# Patient Record
Sex: Female | Born: 1935 | Race: White | Hispanic: No | Smoking: Never smoker
Health system: Southern US, Community
[De-identification: ages and names within clinical notes are randomized; demographics above are authoritative.]

## PROBLEM LIST (undated history)

## (undated) DIAGNOSIS — K5730 Diverticulosis of large intestine without perforation or abscess without bleeding: Secondary | ICD-10-CM

## (undated) DIAGNOSIS — M545 Low back pain, unspecified: Secondary | ICD-10-CM

## (undated) DIAGNOSIS — M1990 Unspecified osteoarthritis, unspecified site: Secondary | ICD-10-CM

## (undated) DIAGNOSIS — I739 Peripheral vascular disease, unspecified: Secondary | ICD-10-CM

## (undated) DIAGNOSIS — D649 Anemia, unspecified: Secondary | ICD-10-CM

## (undated) DIAGNOSIS — I350 Nonrheumatic aortic (valve) stenosis: Secondary | ICD-10-CM

## (undated) DIAGNOSIS — J45909 Unspecified asthma, uncomplicated: Secondary | ICD-10-CM

## (undated) DIAGNOSIS — I10 Essential (primary) hypertension: Secondary | ICD-10-CM

## (undated) DIAGNOSIS — N393 Stress incontinence (female) (male): Secondary | ICD-10-CM

## (undated) DIAGNOSIS — K449 Diaphragmatic hernia without obstruction or gangrene: Secondary | ICD-10-CM

## (undated) DIAGNOSIS — E785 Hyperlipidemia, unspecified: Secondary | ICD-10-CM

## (undated) DIAGNOSIS — H3530 Unspecified macular degeneration: Secondary | ICD-10-CM

## (undated) DIAGNOSIS — I509 Heart failure, unspecified: Secondary | ICD-10-CM

## (undated) DIAGNOSIS — I872 Venous insufficiency (chronic) (peripheral): Secondary | ICD-10-CM

## (undated) DIAGNOSIS — F419 Anxiety disorder, unspecified: Secondary | ICD-10-CM

## (undated) DIAGNOSIS — M542 Cervicalgia: Secondary | ICD-10-CM

## (undated) DIAGNOSIS — K219 Gastro-esophageal reflux disease without esophagitis: Secondary | ICD-10-CM

## (undated) DIAGNOSIS — J189 Pneumonia, unspecified organism: Secondary | ICD-10-CM

## (undated) HISTORY — DX: Peripheral vascular disease, unspecified: I73.9

## (undated) HISTORY — DX: Unspecified macular degeneration: H35.30

## (undated) HISTORY — DX: Heart failure, unspecified: I50.9

## (undated) HISTORY — PX: SHOULDER SURGERY: SHX246

## (undated) HISTORY — DX: Low back pain: M54.5

## (undated) HISTORY — DX: Unspecified asthma, uncomplicated: J45.909

## (undated) HISTORY — DX: Diaphragmatic hernia without obstruction or gangrene: K44.9

## (undated) HISTORY — DX: Anemia, unspecified: D64.9

## (undated) HISTORY — DX: Nonrheumatic aortic (valve) stenosis: I35.0

## (undated) HISTORY — DX: Cervicalgia: M54.2

## (undated) HISTORY — DX: Pneumonia, unspecified organism: J18.9

## (undated) HISTORY — DX: Anxiety disorder, unspecified: F41.9

## (undated) HISTORY — DX: Diverticulosis of large intestine without perforation or abscess without bleeding: K57.30

## (undated) HISTORY — PX: LUMBAR LAMINECTOMY: SHX95

## (undated) HISTORY — DX: Essential (primary) hypertension: I10

## (undated) HISTORY — DX: Hyperlipidemia, unspecified: E78.5

## (undated) HISTORY — DX: Low back pain, unspecified: M54.50

## (undated) HISTORY — DX: Unspecified osteoarthritis, unspecified site: M19.90

## (undated) HISTORY — DX: Gastro-esophageal reflux disease without esophagitis: K21.9

## (undated) HISTORY — DX: Venous insufficiency (chronic) (peripheral): I87.2

## (undated) HISTORY — DX: Stress incontinence (female) (male): N39.3

---

## 1997-12-24 ENCOUNTER — Other Ambulatory Visit: Admission: RE | Admit: 1997-12-24 | Discharge: 1997-12-24 | Payer: Self-pay | Admitting: Obstetrics and Gynecology

## 1998-02-06 ENCOUNTER — Encounter: Payer: Self-pay | Admitting: Specialist

## 1998-02-06 ENCOUNTER — Ambulatory Visit (HOSPITAL_COMMUNITY): Admission: RE | Admit: 1998-02-06 | Discharge: 1998-02-06 | Payer: Self-pay | Admitting: Specialist

## 1998-12-18 ENCOUNTER — Encounter: Payer: Self-pay | Admitting: Obstetrics and Gynecology

## 1998-12-18 ENCOUNTER — Encounter: Admission: RE | Admit: 1998-12-18 | Discharge: 1998-12-18 | Payer: Self-pay | Admitting: Obstetrics and Gynecology

## 1999-01-01 ENCOUNTER — Other Ambulatory Visit: Admission: RE | Admit: 1999-01-01 | Discharge: 1999-01-01 | Payer: Self-pay | Admitting: Obstetrics and Gynecology

## 1999-05-01 ENCOUNTER — Encounter: Payer: Self-pay | Admitting: Orthopedic Surgery

## 1999-05-07 ENCOUNTER — Inpatient Hospital Stay (HOSPITAL_COMMUNITY): Admission: RE | Admit: 1999-05-07 | Discharge: 1999-05-10 | Payer: Self-pay | Admitting: Orthopedic Surgery

## 1999-12-24 ENCOUNTER — Encounter: Payer: Self-pay | Admitting: Obstetrics and Gynecology

## 1999-12-24 ENCOUNTER — Encounter: Admission: RE | Admit: 1999-12-24 | Discharge: 1999-12-24 | Payer: Self-pay | Admitting: Obstetrics and Gynecology

## 2000-01-19 ENCOUNTER — Other Ambulatory Visit: Admission: RE | Admit: 2000-01-19 | Discharge: 2000-01-19 | Payer: Self-pay | Admitting: Obstetrics and Gynecology

## 2001-02-17 ENCOUNTER — Other Ambulatory Visit: Admission: RE | Admit: 2001-02-17 | Discharge: 2001-02-17 | Payer: Self-pay | Admitting: Obstetrics and Gynecology

## 2001-02-21 ENCOUNTER — Encounter: Payer: Self-pay | Admitting: Obstetrics and Gynecology

## 2001-02-21 ENCOUNTER — Encounter: Admission: RE | Admit: 2001-02-21 | Discharge: 2001-02-21 | Payer: Self-pay | Admitting: Obstetrics and Gynecology

## 2001-05-04 ENCOUNTER — Encounter: Payer: Self-pay | Admitting: Orthopedic Surgery

## 2001-05-11 ENCOUNTER — Inpatient Hospital Stay (HOSPITAL_COMMUNITY): Admission: RE | Admit: 2001-05-11 | Discharge: 2001-05-13 | Payer: Self-pay | Admitting: Orthopedic Surgery

## 2002-02-08 ENCOUNTER — Encounter: Admission: RE | Admit: 2002-02-08 | Discharge: 2002-02-08 | Payer: Self-pay | Admitting: Obstetrics and Gynecology

## 2002-02-08 ENCOUNTER — Encounter: Payer: Self-pay | Admitting: Obstetrics and Gynecology

## 2002-06-06 ENCOUNTER — Encounter: Payer: Self-pay | Admitting: Orthopedic Surgery

## 2002-06-12 ENCOUNTER — Inpatient Hospital Stay (HOSPITAL_COMMUNITY): Admission: RE | Admit: 2002-06-12 | Discharge: 2002-06-15 | Payer: Self-pay | Admitting: Orthopedic Surgery

## 2002-12-23 ENCOUNTER — Encounter
Admission: RE | Admit: 2002-12-23 | Discharge: 2002-12-23 | Payer: Self-pay | Admitting: Physical Medicine and Rehabilitation

## 2003-01-15 ENCOUNTER — Inpatient Hospital Stay (HOSPITAL_COMMUNITY): Admission: RE | Admit: 2003-01-15 | Discharge: 2003-01-17 | Payer: Self-pay | Admitting: Specialist

## 2003-01-15 ENCOUNTER — Encounter (INDEPENDENT_AMBULATORY_CARE_PROVIDER_SITE_OTHER): Payer: Self-pay | Admitting: Specialist

## 2003-02-21 ENCOUNTER — Encounter: Admission: RE | Admit: 2003-02-21 | Discharge: 2003-02-21 | Payer: Self-pay | Admitting: Obstetrics and Gynecology

## 2003-12-19 ENCOUNTER — Ambulatory Visit: Payer: Self-pay | Admitting: Pulmonary Disease

## 2004-01-16 ENCOUNTER — Encounter: Admission: RE | Admit: 2004-01-16 | Discharge: 2004-01-16 | Payer: Self-pay | Admitting: Specialist

## 2004-02-06 ENCOUNTER — Inpatient Hospital Stay (HOSPITAL_COMMUNITY): Admission: EM | Admit: 2004-02-06 | Discharge: 2004-02-15 | Payer: Self-pay | Admitting: Emergency Medicine

## 2004-02-08 ENCOUNTER — Ambulatory Visit: Payer: Self-pay | Admitting: Pulmonary Disease

## 2004-02-21 ENCOUNTER — Emergency Department (HOSPITAL_COMMUNITY): Admission: EM | Admit: 2004-02-21 | Discharge: 2004-02-21 | Payer: Self-pay | Admitting: Emergency Medicine

## 2004-04-03 ENCOUNTER — Ambulatory Visit: Payer: Self-pay | Admitting: Pulmonary Disease

## 2004-04-06 ENCOUNTER — Ambulatory Visit: Payer: Self-pay | Admitting: Gastroenterology

## 2004-04-06 ENCOUNTER — Inpatient Hospital Stay (HOSPITAL_COMMUNITY): Admission: EM | Admit: 2004-04-06 | Discharge: 2004-04-10 | Payer: Self-pay | Admitting: Emergency Medicine

## 2004-04-06 ENCOUNTER — Ambulatory Visit: Payer: Self-pay | Admitting: Pulmonary Disease

## 2004-04-07 ENCOUNTER — Encounter (INDEPENDENT_AMBULATORY_CARE_PROVIDER_SITE_OTHER): Payer: Self-pay | Admitting: *Deleted

## 2004-04-09 ENCOUNTER — Encounter (INDEPENDENT_AMBULATORY_CARE_PROVIDER_SITE_OTHER): Payer: Self-pay | Admitting: Specialist

## 2004-04-15 ENCOUNTER — Ambulatory Visit: Payer: Self-pay | Admitting: Pulmonary Disease

## 2004-05-21 ENCOUNTER — Ambulatory Visit: Payer: Self-pay | Admitting: Pulmonary Disease

## 2004-07-03 ENCOUNTER — Ambulatory Visit: Payer: Self-pay | Admitting: Pulmonary Disease

## 2004-07-04 ENCOUNTER — Inpatient Hospital Stay (HOSPITAL_COMMUNITY): Admission: AD | Admit: 2004-07-04 | Discharge: 2004-07-09 | Payer: Self-pay | Admitting: Pulmonary Disease

## 2004-07-04 ENCOUNTER — Ambulatory Visit: Payer: Self-pay | Admitting: Pulmonary Disease

## 2004-07-05 ENCOUNTER — Encounter (INDEPENDENT_AMBULATORY_CARE_PROVIDER_SITE_OTHER): Payer: Self-pay | Admitting: *Deleted

## 2004-07-08 ENCOUNTER — Ambulatory Visit: Payer: Self-pay | Admitting: Internal Medicine

## 2004-07-15 ENCOUNTER — Ambulatory Visit: Payer: Self-pay | Admitting: Pulmonary Disease

## 2004-08-13 ENCOUNTER — Ambulatory Visit: Payer: Self-pay | Admitting: Pulmonary Disease

## 2004-09-10 ENCOUNTER — Ambulatory Visit: Payer: Self-pay | Admitting: Pulmonary Disease

## 2004-09-23 ENCOUNTER — Ambulatory Visit (HOSPITAL_COMMUNITY): Admission: RE | Admit: 2004-09-23 | Discharge: 2004-09-23 | Payer: Self-pay | Admitting: Pulmonary Disease

## 2004-10-08 ENCOUNTER — Ambulatory Visit: Payer: Self-pay | Admitting: Pulmonary Disease

## 2004-12-03 ENCOUNTER — Ambulatory Visit: Payer: Self-pay | Admitting: Pulmonary Disease

## 2004-12-10 ENCOUNTER — Ambulatory Visit (HOSPITAL_COMMUNITY): Admission: RE | Admit: 2004-12-10 | Discharge: 2004-12-10 | Payer: Self-pay | Admitting: Pulmonary Disease

## 2005-01-07 ENCOUNTER — Ambulatory Visit: Payer: Self-pay | Admitting: Pulmonary Disease

## 2005-02-18 ENCOUNTER — Ambulatory Visit: Payer: Self-pay | Admitting: Pulmonary Disease

## 2005-04-01 ENCOUNTER — Ambulatory Visit: Payer: Self-pay | Admitting: Pulmonary Disease

## 2005-04-22 ENCOUNTER — Ambulatory Visit: Payer: Self-pay | Admitting: Pulmonary Disease

## 2005-05-27 ENCOUNTER — Ambulatory Visit: Payer: Self-pay | Admitting: Pulmonary Disease

## 2005-08-19 ENCOUNTER — Ambulatory Visit: Payer: Self-pay | Admitting: Pulmonary Disease

## 2005-09-16 ENCOUNTER — Ambulatory Visit: Payer: Self-pay | Admitting: Internal Medicine

## 2005-11-18 ENCOUNTER — Ambulatory Visit: Payer: Self-pay | Admitting: Pulmonary Disease

## 2006-03-26 ENCOUNTER — Encounter: Admission: RE | Admit: 2006-03-26 | Discharge: 2006-03-26 | Payer: Self-pay | Admitting: Specialist

## 2006-03-31 ENCOUNTER — Ambulatory Visit: Payer: Self-pay | Admitting: Pulmonary Disease

## 2006-03-31 LAB — CONVERTED CEMR LAB
AST: 27 units/L (ref 0–37)
Albumin: 4.1 g/dL (ref 3.5–5.2)
Basophils Absolute: 0 10*3/uL (ref 0.0–0.1)
Basophils Relative: 0.1 % (ref 0.0–1.0)
Chloride: 102 meq/L (ref 96–112)
Creatinine, Ser: 0.9 mg/dL (ref 0.4–1.2)
Eosinophils Relative: 0.3 % (ref 0.0–5.0)
HCT: 37.6 % (ref 36.0–46.0)
Iron: 94 ug/dL (ref 42–145)
MCHC: 34.2 g/dL (ref 30.0–36.0)
Neutrophils Relative %: 86.3 % — ABNORMAL HIGH (ref 43.0–77.0)
RBC: 3.81 M/uL — ABNORMAL LOW (ref 3.87–5.11)
RDW: 13 % (ref 11.5–14.6)
Sodium: 140 meq/L (ref 135–145)
Total Bilirubin: 0.9 mg/dL (ref 0.3–1.2)
WBC: 11.7 10*3/uL — ABNORMAL HIGH (ref 4.5–10.5)

## 2006-04-28 ENCOUNTER — Encounter: Admission: RE | Admit: 2006-04-28 | Discharge: 2006-04-28 | Payer: Self-pay | Admitting: Specialist

## 2006-07-21 ENCOUNTER — Ambulatory Visit: Payer: Self-pay | Admitting: Pulmonary Disease

## 2006-07-22 ENCOUNTER — Encounter: Admission: RE | Admit: 2006-07-22 | Discharge: 2006-10-20 | Payer: Self-pay | Admitting: Ophthalmology

## 2006-10-14 ENCOUNTER — Encounter: Admission: RE | Admit: 2006-10-14 | Discharge: 2007-01-12 | Payer: Self-pay | Admitting: Pulmonary Disease

## 2006-10-25 ENCOUNTER — Ambulatory Visit: Payer: Self-pay | Admitting: Pulmonary Disease

## 2006-10-25 LAB — CONVERTED CEMR LAB
Bilirubin, Direct: 0.1 mg/dL (ref 0.0–0.3)
Eosinophils Absolute: 0.2 10*3/uL (ref 0.0–0.6)
Eosinophils Relative: 1.8 % (ref 0.0–5.0)
GFR calc Af Amer: 89 mL/min
GFR calc non Af Amer: 74 mL/min
Glucose, Bld: 93 mg/dL (ref 70–99)
HCT: 37.3 % (ref 36.0–46.0)
Lymphocytes Relative: 8.4 % — ABNORMAL LOW (ref 12.0–46.0)
MCV: 94.1 fL (ref 78.0–100.0)
Monocytes Absolute: 0.3 10*3/uL (ref 0.2–0.7)
Neutro Abs: 10.1 10*3/uL — ABNORMAL HIGH (ref 1.4–7.7)
Neutrophils Relative %: 87.3 % — ABNORMAL HIGH (ref 43.0–77.0)
Platelets: 190 10*3/uL (ref 150–400)
Potassium: 5 meq/L (ref 3.5–5.1)
Sodium: 139 meq/L (ref 135–145)
WBC: 11.6 10*3/uL — ABNORMAL HIGH (ref 4.5–10.5)

## 2006-11-09 ENCOUNTER — Ambulatory Visit: Payer: Self-pay | Admitting: Pulmonary Disease

## 2006-12-01 ENCOUNTER — Encounter: Admission: RE | Admit: 2006-12-01 | Discharge: 2006-12-29 | Payer: Self-pay | Admitting: Specialist

## 2007-01-19 ENCOUNTER — Ambulatory Visit: Payer: Self-pay | Admitting: Pulmonary Disease

## 2007-01-19 ENCOUNTER — Inpatient Hospital Stay (HOSPITAL_COMMUNITY): Admission: EM | Admit: 2007-01-19 | Discharge: 2007-01-31 | Payer: Self-pay | Admitting: Emergency Medicine

## 2007-01-19 ENCOUNTER — Ambulatory Visit: Payer: Self-pay | Admitting: Cardiology

## 2007-01-20 ENCOUNTER — Encounter: Payer: Self-pay | Admitting: Internal Medicine

## 2007-02-01 ENCOUNTER — Telehealth (INDEPENDENT_AMBULATORY_CARE_PROVIDER_SITE_OTHER): Payer: Self-pay | Admitting: *Deleted

## 2007-02-10 DIAGNOSIS — I10 Essential (primary) hypertension: Secondary | ICD-10-CM | POA: Insufficient documentation

## 2007-02-10 DIAGNOSIS — I739 Peripheral vascular disease, unspecified: Secondary | ICD-10-CM

## 2007-02-10 DIAGNOSIS — I872 Venous insufficiency (chronic) (peripheral): Secondary | ICD-10-CM | POA: Insufficient documentation

## 2007-02-10 DIAGNOSIS — D649 Anemia, unspecified: Secondary | ICD-10-CM | POA: Insufficient documentation

## 2007-02-10 DIAGNOSIS — M1990 Unspecified osteoarthritis, unspecified site: Secondary | ICD-10-CM | POA: Insufficient documentation

## 2007-02-10 DIAGNOSIS — M810 Age-related osteoporosis without current pathological fracture: Secondary | ICD-10-CM | POA: Insufficient documentation

## 2007-02-10 DIAGNOSIS — F411 Generalized anxiety disorder: Secondary | ICD-10-CM | POA: Insufficient documentation

## 2007-02-10 DIAGNOSIS — K219 Gastro-esophageal reflux disease without esophagitis: Secondary | ICD-10-CM

## 2007-02-10 DIAGNOSIS — E785 Hyperlipidemia, unspecified: Secondary | ICD-10-CM

## 2007-02-10 DIAGNOSIS — M545 Low back pain: Secondary | ICD-10-CM

## 2007-02-10 DIAGNOSIS — N39498 Other specified urinary incontinence: Secondary | ICD-10-CM

## 2007-02-10 DIAGNOSIS — K449 Diaphragmatic hernia without obstruction or gangrene: Secondary | ICD-10-CM | POA: Insufficient documentation

## 2007-02-10 DIAGNOSIS — K5730 Diverticulosis of large intestine without perforation or abscess without bleeding: Secondary | ICD-10-CM | POA: Insufficient documentation

## 2007-02-16 ENCOUNTER — Ambulatory Visit: Payer: Self-pay | Admitting: Pulmonary Disease

## 2007-02-20 LAB — CONVERTED CEMR LAB
ALT: 19 units/L (ref 0–35)
Basophils Relative: 0.1 % (ref 0.0–1.0)
Bilirubin, Direct: 0.1 mg/dL (ref 0.0–0.3)
CO2: 25 meq/L (ref 19–32)
Calcium: 9.1 mg/dL (ref 8.4–10.5)
Creatinine, Ser: 1 mg/dL (ref 0.4–1.2)
Eosinophils Relative: 1.1 % (ref 0.0–5.0)
GFR calc Af Amer: 69 mL/min
Glucose, Bld: 118 mg/dL — ABNORMAL HIGH (ref 70–99)
Hemoglobin: 12.1 g/dL (ref 12.0–15.0)
Lymphocytes Relative: 14.8 % (ref 12.0–46.0)
Monocytes Absolute: 0.5 10*3/uL (ref 0.2–0.7)
Neutro Abs: 7.2 10*3/uL (ref 1.4–7.7)
RDW: 15.2 % — ABNORMAL HIGH (ref 11.5–14.6)
Total Bilirubin: 0.6 mg/dL (ref 0.3–1.2)
Total Protein: 6.2 g/dL (ref 6.0–8.3)
WBC: 9.2 10*3/uL (ref 4.5–10.5)

## 2007-02-25 ENCOUNTER — Telehealth: Payer: Self-pay | Admitting: Pulmonary Disease

## 2007-03-01 ENCOUNTER — Telehealth (INDEPENDENT_AMBULATORY_CARE_PROVIDER_SITE_OTHER): Payer: Self-pay | Admitting: *Deleted

## 2007-03-08 ENCOUNTER — Encounter: Payer: Self-pay | Admitting: Pulmonary Disease

## 2007-03-28 ENCOUNTER — Telehealth (INDEPENDENT_AMBULATORY_CARE_PROVIDER_SITE_OTHER): Payer: Self-pay | Admitting: *Deleted

## 2007-03-29 ENCOUNTER — Ambulatory Visit: Payer: Self-pay | Admitting: Internal Medicine

## 2007-03-31 ENCOUNTER — Telehealth: Payer: Self-pay | Admitting: Adult Health

## 2007-04-01 ENCOUNTER — Ambulatory Visit: Payer: Self-pay | Admitting: Pulmonary Disease

## 2007-04-20 ENCOUNTER — Encounter: Admission: RE | Admit: 2007-04-20 | Discharge: 2007-04-20 | Payer: Self-pay | Admitting: Specialist

## 2007-05-02 ENCOUNTER — Ambulatory Visit: Payer: Self-pay | Admitting: Pulmonary Disease

## 2007-05-31 ENCOUNTER — Telehealth (INDEPENDENT_AMBULATORY_CARE_PROVIDER_SITE_OTHER): Payer: Self-pay | Admitting: *Deleted

## 2007-06-01 ENCOUNTER — Ambulatory Visit: Payer: Self-pay | Admitting: Internal Medicine

## 2007-06-02 ENCOUNTER — Encounter: Payer: Self-pay | Admitting: Pulmonary Disease

## 2007-06-08 LAB — CONVERTED CEMR LAB
AST: 26 units/L (ref 0–37)
Albumin: 3.7 g/dL (ref 3.5–5.2)
Alkaline Phosphatase: 36 units/L — ABNORMAL LOW (ref 39–117)
BUN: 27 mg/dL — ABNORMAL HIGH (ref 6–23)
Basophils Absolute: 0.1 10*3/uL (ref 0.0–0.1)
Basophils Relative: 1.1 % — ABNORMAL HIGH (ref 0.0–1.0)
Chloride: 105 meq/L (ref 96–112)
Crystals: NEGATIVE
Eosinophils Absolute: 0.2 10*3/uL (ref 0.0–0.7)
GFR calc Af Amer: 69 mL/min
GFR calc non Af Amer: 57 mL/min
Leukocytes, UA: NEGATIVE
Lymphocytes Relative: 7.7 % — ABNORMAL LOW (ref 12.0–46.0)
MCHC: 33.6 g/dL (ref 30.0–36.0)
Mucus, UA: NEGATIVE
Neutrophils Relative %: 85 % — ABNORMAL HIGH (ref 43.0–77.0)
Nitrite: NEGATIVE
Platelets: 228 10*3/uL (ref 150–400)
Potassium: 4.5 meq/L (ref 3.5–5.1)
RBC / HPF: NONE SEEN
RBC: 3.93 M/uL (ref 3.87–5.11)
RDW: 13.6 % (ref 11.5–14.6)
Specific Gravity, Urine: <=1.005 (ref 1.000–1.03)
TSH: 1.39 microintl units/mL (ref 0.35–5.50)
Total Bilirubin: 0.6 mg/dL (ref 0.3–1.2)
Urobilinogen, UA: 0.2 (ref 0.0–1.0)
WBC, UA: NONE SEEN cells/hpf
pH: 7 (ref 5.0–8.0)

## 2007-06-10 ENCOUNTER — Telehealth (INDEPENDENT_AMBULATORY_CARE_PROVIDER_SITE_OTHER): Payer: Self-pay | Admitting: *Deleted

## 2007-07-04 ENCOUNTER — Ambulatory Visit: Payer: Self-pay | Admitting: Internal Medicine

## 2007-07-25 ENCOUNTER — Ambulatory Visit: Payer: Self-pay | Admitting: Pulmonary Disease

## 2007-08-08 ENCOUNTER — Telehealth (INDEPENDENT_AMBULATORY_CARE_PROVIDER_SITE_OTHER): Payer: Self-pay | Admitting: *Deleted

## 2007-08-09 ENCOUNTER — Ambulatory Visit: Payer: Self-pay | Admitting: Internal Medicine

## 2007-08-22 ENCOUNTER — Telehealth (INDEPENDENT_AMBULATORY_CARE_PROVIDER_SITE_OTHER): Payer: Self-pay | Admitting: *Deleted

## 2007-09-05 ENCOUNTER — Ambulatory Visit: Payer: Self-pay | Admitting: Pulmonary Disease

## 2007-09-05 ENCOUNTER — Inpatient Hospital Stay (HOSPITAL_COMMUNITY): Admission: EM | Admit: 2007-09-05 | Discharge: 2007-09-10 | Payer: Self-pay | Admitting: Emergency Medicine

## 2007-09-06 ENCOUNTER — Telehealth: Payer: Self-pay | Admitting: Pulmonary Disease

## 2007-09-15 ENCOUNTER — Ambulatory Visit: Payer: Self-pay | Admitting: Pulmonary Disease

## 2007-09-15 DIAGNOSIS — I359 Nonrheumatic aortic valve disorder, unspecified: Secondary | ICD-10-CM | POA: Insufficient documentation

## 2007-09-28 ENCOUNTER — Encounter: Payer: Self-pay | Admitting: Pulmonary Disease

## 2007-10-07 ENCOUNTER — Telehealth: Payer: Self-pay | Admitting: Pulmonary Disease

## 2007-10-19 ENCOUNTER — Encounter: Admission: RE | Admit: 2007-10-19 | Discharge: 2007-10-19 | Payer: Self-pay | Admitting: Specialist

## 2007-10-20 ENCOUNTER — Encounter: Payer: Self-pay | Admitting: Pulmonary Disease

## 2007-10-26 ENCOUNTER — Encounter: Payer: Self-pay | Admitting: Pulmonary Disease

## 2007-10-27 ENCOUNTER — Telehealth: Payer: Self-pay | Admitting: Pulmonary Disease

## 2007-11-04 ENCOUNTER — Ambulatory Visit: Payer: Self-pay | Admitting: Family Medicine

## 2007-11-04 ENCOUNTER — Ambulatory Visit: Payer: Self-pay | Admitting: Pulmonary Disease

## 2007-11-10 ENCOUNTER — Telehealth: Payer: Self-pay | Admitting: Pulmonary Disease

## 2007-11-11 ENCOUNTER — Encounter: Payer: Self-pay | Admitting: Pulmonary Disease

## 2007-11-14 ENCOUNTER — Ambulatory Visit: Payer: Self-pay | Admitting: Pulmonary Disease

## 2007-11-15 ENCOUNTER — Encounter: Payer: Self-pay | Admitting: Pulmonary Disease

## 2007-11-16 ENCOUNTER — Telehealth: Payer: Self-pay | Admitting: Pulmonary Disease

## 2007-11-17 ENCOUNTER — Ambulatory Visit: Payer: Self-pay | Admitting: Pulmonary Disease

## 2007-11-17 ENCOUNTER — Telehealth (INDEPENDENT_AMBULATORY_CARE_PROVIDER_SITE_OTHER): Payer: Self-pay | Admitting: *Deleted

## 2007-11-25 ENCOUNTER — Telehealth (INDEPENDENT_AMBULATORY_CARE_PROVIDER_SITE_OTHER): Payer: Self-pay | Admitting: *Deleted

## 2007-11-25 ENCOUNTER — Ambulatory Visit: Payer: Self-pay | Admitting: Pulmonary Disease

## 2007-12-15 ENCOUNTER — Ambulatory Visit: Payer: Self-pay | Admitting: Pulmonary Disease

## 2007-12-19 ENCOUNTER — Ambulatory Visit: Payer: Self-pay | Admitting: Pulmonary Disease

## 2007-12-19 LAB — CONVERTED CEMR LAB
CO2: 28 meq/L (ref 19–32)
Chloride: 104 meq/L (ref 96–112)
Creatinine, Ser: 0.8 mg/dL (ref 0.4–1.2)
GFR calc non Af Amer: 73 mL/min
Potassium: 4.6 meq/L (ref 3.5–5.1)

## 2007-12-20 ENCOUNTER — Ambulatory Visit: Payer: Self-pay | Admitting: Cardiovascular Disease

## 2007-12-26 ENCOUNTER — Telehealth: Payer: Self-pay | Admitting: Adult Health

## 2008-01-04 ENCOUNTER — Ambulatory Visit: Payer: Self-pay | Admitting: Pulmonary Disease

## 2008-01-04 DIAGNOSIS — J329 Chronic sinusitis, unspecified: Secondary | ICD-10-CM | POA: Insufficient documentation

## 2008-01-04 DIAGNOSIS — J189 Pneumonia, unspecified organism: Secondary | ICD-10-CM

## 2008-02-13 ENCOUNTER — Telehealth (INDEPENDENT_AMBULATORY_CARE_PROVIDER_SITE_OTHER): Payer: Self-pay | Admitting: *Deleted

## 2008-02-14 ENCOUNTER — Ambulatory Visit: Payer: Self-pay | Admitting: Pulmonary Disease

## 2008-05-21 ENCOUNTER — Telehealth: Payer: Self-pay | Admitting: Pulmonary Disease

## 2008-05-23 ENCOUNTER — Ambulatory Visit: Payer: Self-pay | Admitting: Pulmonary Disease

## 2008-05-25 ENCOUNTER — Encounter: Payer: Self-pay | Admitting: Pulmonary Disease

## 2008-05-30 ENCOUNTER — Ambulatory Visit: Payer: Self-pay | Admitting: Pulmonary Disease

## 2008-05-30 DIAGNOSIS — M109 Gout, unspecified: Secondary | ICD-10-CM

## 2008-06-02 DIAGNOSIS — H3530 Unspecified macular degeneration: Secondary | ICD-10-CM | POA: Insufficient documentation

## 2008-06-02 LAB — CONVERTED CEMR LAB
ALT: 23 units/L (ref 0–35)
AST: 23 units/L (ref 0–37)
Alkaline Phosphatase: 35 units/L — ABNORMAL LOW (ref 39–117)
Basophils Relative: 0.7 % (ref 0.0–3.0)
Bilirubin, Direct: 0.1 mg/dL (ref 0.0–0.3)
CO2: 29 meq/L (ref 19–32)
Chloride: 108 meq/L (ref 96–112)
Direct LDL: 127.7 mg/dL
Eosinophils Relative: 4.9 % (ref 0.0–5.0)
Glucose, Bld: 85 mg/dL (ref 70–99)
HDL: 44.1 mg/dL (ref >39.00)
Lymphocytes Relative: 28.7 % (ref 12.0–46.0)
Monocytes Absolute: 0.6 10*3/uL (ref 0.1–1.0)
Monocytes Relative: 9.8 % (ref 3.0–12.0)
Neutrophils Relative %: 55.9 % (ref 43.0–77.0)
Platelets: 179 10*3/uL (ref 150.0–400.0)
RBC: 3.88 M/uL (ref 3.87–5.11)
Sodium: 147 meq/L — ABNORMAL HIGH (ref 135–145)
Total Bilirubin: 0.8 mg/dL (ref 0.3–1.2)
Total CHOL/HDL Ratio: 5
Triglycerides: 153 mg/dL — ABNORMAL HIGH (ref 0.0–149.0)
VLDL: 30.6 mg/dL (ref 0.0–40.0)
WBC: 6.6 10*3/uL (ref 4.5–10.5)

## 2008-06-04 ENCOUNTER — Encounter: Admission: RE | Admit: 2008-06-04 | Discharge: 2008-06-04 | Payer: Self-pay | Admitting: Neurology

## 2008-06-07 ENCOUNTER — Telehealth (INDEPENDENT_AMBULATORY_CARE_PROVIDER_SITE_OTHER): Payer: Self-pay | Admitting: *Deleted

## 2008-06-15 ENCOUNTER — Encounter: Payer: Self-pay | Admitting: Pulmonary Disease

## 2008-07-05 ENCOUNTER — Encounter: Payer: Self-pay | Admitting: Pulmonary Disease

## 2008-07-26 ENCOUNTER — Telehealth: Payer: Self-pay | Admitting: Pulmonary Disease

## 2008-10-03 ENCOUNTER — Ambulatory Visit: Payer: Self-pay | Admitting: Pulmonary Disease

## 2008-10-07 DIAGNOSIS — M542 Cervicalgia: Secondary | ICD-10-CM

## 2008-11-13 ENCOUNTER — Encounter: Payer: Self-pay | Admitting: Pulmonary Disease

## 2008-11-13 ENCOUNTER — Telehealth: Payer: Self-pay | Admitting: Pulmonary Disease

## 2008-11-14 ENCOUNTER — Inpatient Hospital Stay (HOSPITAL_COMMUNITY): Admission: EM | Admit: 2008-11-14 | Discharge: 2008-11-16 | Payer: Self-pay | Admitting: Emergency Medicine

## 2008-11-22 ENCOUNTER — Telehealth: Payer: Self-pay | Admitting: Pulmonary Disease

## 2008-11-29 ENCOUNTER — Ambulatory Visit: Payer: Self-pay | Admitting: Pulmonary Disease

## 2008-11-29 LAB — CONVERTED CEMR LAB
Hemoglobin, Urine: NEGATIVE
Nitrite: NEGATIVE
Total Protein, Urine: NEGATIVE mg/dL

## 2008-12-01 ENCOUNTER — Encounter: Payer: Self-pay | Admitting: Pulmonary Disease

## 2008-12-10 ENCOUNTER — Telehealth: Payer: Self-pay | Admitting: Pulmonary Disease

## 2008-12-11 ENCOUNTER — Telehealth (INDEPENDENT_AMBULATORY_CARE_PROVIDER_SITE_OTHER): Payer: Self-pay | Admitting: *Deleted

## 2008-12-24 ENCOUNTER — Telehealth: Payer: Self-pay | Admitting: Pulmonary Disease

## 2008-12-24 DIAGNOSIS — N390 Urinary tract infection, site not specified: Secondary | ICD-10-CM

## 2008-12-25 ENCOUNTER — Encounter: Payer: Self-pay | Admitting: Pulmonary Disease

## 2009-01-07 ENCOUNTER — Emergency Department (HOSPITAL_COMMUNITY): Admission: EM | Admit: 2009-01-07 | Discharge: 2009-01-07 | Payer: Self-pay | Admitting: Emergency Medicine

## 2009-01-07 ENCOUNTER — Telehealth (INDEPENDENT_AMBULATORY_CARE_PROVIDER_SITE_OTHER): Payer: Self-pay | Admitting: *Deleted

## 2009-01-08 ENCOUNTER — Telehealth: Payer: Self-pay | Admitting: Pulmonary Disease

## 2009-01-10 ENCOUNTER — Ambulatory Visit: Payer: Self-pay | Admitting: Pulmonary Disease

## 2009-01-17 ENCOUNTER — Encounter: Payer: Self-pay | Admitting: Pulmonary Disease

## 2009-01-21 ENCOUNTER — Encounter: Payer: Self-pay | Admitting: Pulmonary Disease

## 2009-02-05 ENCOUNTER — Encounter: Payer: Self-pay | Admitting: Pulmonary Disease

## 2009-02-19 ENCOUNTER — Ambulatory Visit: Payer: Self-pay | Admitting: Internal Medicine

## 2009-02-19 ENCOUNTER — Telehealth (INDEPENDENT_AMBULATORY_CARE_PROVIDER_SITE_OTHER): Payer: Self-pay | Admitting: *Deleted

## 2009-04-08 ENCOUNTER — Telehealth (INDEPENDENT_AMBULATORY_CARE_PROVIDER_SITE_OTHER): Payer: Self-pay | Admitting: *Deleted

## 2009-04-18 ENCOUNTER — Emergency Department (HOSPITAL_COMMUNITY): Admission: EM | Admit: 2009-04-18 | Discharge: 2009-04-18 | Payer: Self-pay | Admitting: Emergency Medicine

## 2009-04-22 ENCOUNTER — Encounter (HOSPITAL_BASED_OUTPATIENT_CLINIC_OR_DEPARTMENT_OTHER): Admission: RE | Admit: 2009-04-22 | Discharge: 2009-07-21 | Payer: Self-pay | Admitting: Internal Medicine

## 2009-05-02 ENCOUNTER — Encounter: Payer: Self-pay | Admitting: Adult Health

## 2009-05-02 ENCOUNTER — Ambulatory Visit: Payer: Self-pay | Admitting: Pulmonary Disease

## 2009-05-03 ENCOUNTER — Telehealth (INDEPENDENT_AMBULATORY_CARE_PROVIDER_SITE_OTHER): Payer: Self-pay | Admitting: *Deleted

## 2009-05-07 LAB — CONVERTED CEMR LAB
Basophils Absolute: 0 10*3/uL (ref 0.0–0.1)
CO2: 28 meq/L (ref 19–32)
Chloride: 105 meq/L (ref 96–112)
Hemoglobin: 12.5 g/dL (ref 12.0–15.0)
Iron: 22 ug/dL — ABNORMAL LOW (ref 42–145)
Lymphocytes Relative: 12.9 % (ref 12.0–46.0)
Monocytes Relative: 5.2 % (ref 3.0–12.0)
Neutro Abs: 6.9 10*3/uL (ref 1.4–7.7)
Neutrophils Relative %: 80.5 % — ABNORMAL HIGH (ref 43.0–77.0)
Nitrite: NEGATIVE
Platelets: 247 10*3/uL (ref 150.0–400.0)
Potassium: 5 meq/L (ref 3.5–5.1)
RDW: 15.6 % — ABNORMAL HIGH (ref 11.5–14.6)
Sodium: 143 meq/L (ref 135–145)
Specific Gravity, Urine: 1.01 (ref 1.000–1.030)
Total Protein, Urine: NEGATIVE mg/dL
Urine Glucose: NEGATIVE mg/dL

## 2009-05-15 ENCOUNTER — Encounter: Payer: Self-pay | Admitting: Pulmonary Disease

## 2009-06-27 ENCOUNTER — Ambulatory Visit: Payer: Self-pay | Admitting: Pulmonary Disease

## 2009-07-07 LAB — CONVERTED CEMR LAB
AST: 26 units/L (ref 0–37)
Albumin: 3.8 g/dL (ref 3.5–5.2)
BUN: 23 mg/dL (ref 6–23)
Basophils Absolute: 0 10*3/uL (ref 0.0–0.1)
CO2: 25 meq/L (ref 19–32)
Calcium: 8.7 mg/dL (ref 8.4–10.5)
Eosinophils Relative: 0.5 % (ref 0.0–5.0)
GFR calc non Af Amer: 114.78 mL/min (ref >60)
Glucose, Bld: 87 mg/dL (ref 70–99)
HCT: 39.1 % (ref 36.0–46.0)
Iron: 57 ug/dL (ref 42–145)
Lymphocytes Relative: 17.4 % (ref 12.0–46.0)
Lymphs Abs: 1.3 10*3/uL (ref 0.7–4.0)
Monocytes Relative: 4.9 % (ref 3.0–12.0)
Platelets: 177 10*3/uL (ref 150.0–400.0)
Potassium: 4 meq/L (ref 3.5–5.1)
Pro B Natriuretic peptide (BNP): 188.2 pg/mL — ABNORMAL HIGH (ref 0.0–100.0)
RDW: 16 % — ABNORMAL HIGH (ref 11.5–14.6)
TSH: 1 microintl units/mL (ref 0.35–5.50)
Total Protein: 6.4 g/dL (ref 6.0–8.3)
Transferrin: 177.5 mg/dL — ABNORMAL LOW (ref 212.0–360.0)
Uric Acid, Serum: 3.2 mg/dL (ref 2.4–7.0)
WBC: 7.7 10*3/uL (ref 4.5–10.5)

## 2009-07-31 ENCOUNTER — Encounter: Payer: Self-pay | Admitting: Pulmonary Disease

## 2009-08-19 ENCOUNTER — Telehealth: Payer: Self-pay | Admitting: Pulmonary Disease

## 2009-10-16 ENCOUNTER — Ambulatory Visit: Payer: Self-pay | Admitting: Pulmonary Disease

## 2009-12-13 ENCOUNTER — Telehealth: Payer: Self-pay | Admitting: Pulmonary Disease

## 2010-02-19 ENCOUNTER — Ambulatory Visit
Admission: RE | Admit: 2010-02-19 | Discharge: 2010-02-19 | Payer: Self-pay | Source: Home / Self Care | Attending: Pulmonary Disease | Admitting: Pulmonary Disease

## 2010-02-19 ENCOUNTER — Other Ambulatory Visit: Payer: Self-pay | Admitting: Pulmonary Disease

## 2010-02-19 ENCOUNTER — Ambulatory Visit: Admit: 2010-02-19 | Payer: Self-pay | Admitting: Pulmonary Disease

## 2010-02-19 LAB — CBC WITH DIFFERENTIAL/PLATELET
Basophils Absolute: 0.1 10*3/uL (ref 0.0–0.1)
Basophils Relative: 0.6 % (ref 0.0–3.0)
Eosinophils Absolute: 0.1 10*3/uL (ref 0.0–0.7)
Eosinophils Relative: 0.6 % (ref 0.0–5.0)
HCT: 38.5 % (ref 36.0–46.0)
Hemoglobin: 13 g/dL (ref 12.0–15.0)
Lymphocytes Relative: 11.8 % — ABNORMAL LOW (ref 12.0–46.0)
Lymphs Abs: 1.6 10*3/uL (ref 0.7–4.0)
MCHC: 33.8 g/dL (ref 30.0–36.0)
MCV: 96 fl (ref 78.0–100.0)
Monocytes Absolute: 1.1 10*3/uL — ABNORMAL HIGH (ref 0.1–1.0)
Monocytes Relative: 8.4 % (ref 3.0–12.0)
Neutro Abs: 10.8 10*3/uL — ABNORMAL HIGH (ref 1.4–7.7)
Neutrophils Relative %: 78.6 % — ABNORMAL HIGH (ref 43.0–77.0)
Platelets: 187 10*3/uL (ref 150.0–400.0)
RBC: 4.01 Mil/uL (ref 3.87–5.11)
RDW: 14.3 % (ref 11.5–14.6)
WBC: 13.7 10*3/uL — ABNORMAL HIGH (ref 4.5–10.5)

## 2010-02-19 LAB — BASIC METABOLIC PANEL
BUN: 13 mg/dL (ref 6–23)
CO2: 28 mEq/L (ref 19–32)
Calcium: 9 mg/dL (ref 8.4–10.5)
Chloride: 103 mEq/L (ref 96–112)
Creatinine, Ser: 0.7 mg/dL (ref 0.4–1.2)
GFR: 89.35 mL/min (ref >60.00)
Glucose, Bld: 102 mg/dL — ABNORMAL HIGH (ref 70–99)
Potassium: 4.7 mEq/L (ref 3.5–5.1)
Sodium: 141 mEq/L (ref 135–145)

## 2010-02-19 LAB — HEPATIC FUNCTION PANEL
ALT: 37 U/L — ABNORMAL HIGH (ref 0–35)
AST: 40 U/L — ABNORMAL HIGH (ref 0–37)
Albumin: 3.2 g/dL — ABNORMAL LOW (ref 3.5–5.2)
Alkaline Phosphatase: 55 U/L (ref 39–117)
Bilirubin, Direct: 0.1 mg/dL (ref 0.0–0.3)
Total Bilirubin: 0.5 mg/dL (ref 0.3–1.2)
Total Protein: 5.9 g/dL — ABNORMAL LOW (ref 6.0–8.3)

## 2010-02-19 LAB — TSH: TSH: 2.03 u[IU]/mL (ref 0.35–5.50)

## 2010-02-19 LAB — URIC ACID: Uric Acid, Serum: 2.1 mg/dL — ABNORMAL LOW (ref 2.4–7.0)

## 2010-02-19 LAB — IBC PANEL
Iron: 13 ug/dL — ABNORMAL LOW (ref 42–145)
Saturation Ratios: 6.6 % — ABNORMAL LOW (ref 20.0–50.0)
Transferrin: 139.9 mg/dL — ABNORMAL LOW (ref 212.0–360.0)

## 2010-02-19 LAB — SEDIMENTATION RATE: Sed Rate: 59 mm/hr — ABNORMAL HIGH (ref 0–22)

## 2010-02-19 LAB — BRAIN NATRIURETIC PEPTIDE: Pro B Natriuretic peptide (BNP): 1445.5 pg/mL — ABNORMAL HIGH (ref 0.0–100.0)

## 2010-02-22 ENCOUNTER — Inpatient Hospital Stay (HOSPITAL_COMMUNITY)
Admission: EM | Admit: 2010-02-22 | Discharge: 2010-02-26 | Payer: Self-pay | Source: Home / Self Care | Attending: Internal Medicine | Admitting: Internal Medicine

## 2010-02-24 ENCOUNTER — Encounter: Payer: Self-pay | Admitting: Specialist

## 2010-02-25 LAB — CBC
HCT: 36.9 % (ref 36.0–46.0)
HCT: 33.6 % — ABNORMAL LOW (ref 36.0–46.0)
Hemoglobin: 12.4 g/dL (ref 12.0–15.0)
MCH: 30.4 pg (ref 26.0–34.0)
MCHC: 32 g/dL (ref 30.0–36.0)
MCHC: 33.3 g/dL (ref 30.0–36.0)
MCV: 93.3 fL (ref 78.0–100.0)
Platelets: 238 10*3/uL (ref 150–400)
Platelets: 297 10*3/uL (ref 150–400)
RBC: 4.11 MIL/uL (ref 3.87–5.11)
RDW: 13.8 % (ref 11.5–15.5)
RDW: 13.6 % (ref 11.5–15.5)
WBC: 12.8 10*3/uL — ABNORMAL HIGH (ref 4.0–10.5)

## 2010-02-25 LAB — COMPREHENSIVE METABOLIC PANEL
ALT: 35 U/L (ref 0–35)
ALT: 29 U/L (ref 0–35)
AST: 30 U/L (ref 0–37)
Alkaline Phosphatase: 56 U/L (ref 39–117)
BUN: 14 mg/dL (ref 6–23)
CO2: 23 mEq/L (ref 19–32)
CO2: 24 mEq/L (ref 19–32)
Calcium: 8.5 mg/dL (ref 8.4–10.5)
Calcium: 7.8 mg/dL — ABNORMAL LOW (ref 8.4–10.5)
Creatinine, Ser: 0.64 mg/dL (ref 0.4–1.2)
GFR calc Af Amer: >60 mL/min (ref >60)
GFR calc non Af Amer: >60 mL/min (ref >60)
Glucose, Bld: 121 mg/dL — ABNORMAL HIGH (ref 70–99)
Glucose, Bld: 128 mg/dL — ABNORMAL HIGH (ref 70–99)
Potassium: 3.3 mEq/L — ABNORMAL LOW (ref 3.5–5.1)
Sodium: 140 mEq/L (ref 135–145)
Sodium: 140 mEq/L (ref 135–145)
Total Protein: 5.9 g/dL — ABNORMAL LOW (ref 6.0–8.3)
Total Protein: 5.7 g/dL — ABNORMAL LOW (ref 6.0–8.3)

## 2010-02-25 LAB — CK TOTAL AND CKMB (NOT AT ARMC)
CK, MB: 6.3 ng/mL (ref 0.3–4.0)
Relative Index: INVALID (ref 0.0–2.5)
Total CK: 78 U/L (ref 7–177)

## 2010-02-25 LAB — BASIC METABOLIC PANEL
Chloride: 105 mEq/L (ref 96–112)
Creatinine, Ser: 0.77 mg/dL (ref 0.4–1.2)
GFR calc Af Amer: >60 mL/min (ref >60)
GFR calc non Af Amer: >60 mL/min (ref >60)
Potassium: 3.7 mEq/L (ref 3.5–5.1)

## 2010-02-25 LAB — BRAIN NATRIURETIC PEPTIDE
Pro B Natriuretic peptide (BNP): 620 pg/mL — ABNORMAL HIGH (ref 0.0–100.0)
Pro B Natriuretic peptide (BNP): 718 pg/mL — ABNORMAL HIGH (ref 0.0–100.0)

## 2010-02-25 LAB — HEPARIN LEVEL (UNFRACTIONATED)
Heparin Unfractionated: 0.24 IU/mL — ABNORMAL LOW (ref 0.30–0.70)
Heparin Unfractionated: 0.3 IU/mL (ref 0.30–0.70)

## 2010-02-25 LAB — TSH: TSH: 0.615 u[IU]/mL (ref 0.350–4.500)

## 2010-02-25 LAB — DIFFERENTIAL
Basophils Absolute: 0 10*3/uL (ref 0.0–0.1)
Eosinophils Relative: 2 % (ref 0–5)
Lymphocytes Relative: 14 % (ref 12–46)
Monocytes Absolute: 0.6 10*3/uL (ref 0.1–1.0)
Monocytes Relative: 5 % (ref 3–12)

## 2010-02-25 LAB — MRSA PCR SCREENING: MRSA by PCR: NEGATIVE

## 2010-02-25 LAB — CARDIAC PANEL(CRET KIN+CKTOT+MB+TROPI)
CK, MB: 9.1 ng/mL (ref 0.3–4.0)
Relative Index: INVALID (ref 0.0–2.5)
Relative Index: 6.7 — ABNORMAL HIGH (ref 0.0–2.5)
Troponin I: 1.28 ng/mL (ref 0.00–0.06)
Troponin I: 1.05 ng/mL (ref 0.00–0.06)

## 2010-02-25 LAB — URINALYSIS, ROUTINE W REFLEX MICROSCOPIC
Bilirubin Urine: NEGATIVE
Ketones, ur: NEGATIVE mg/dL
Nitrite: NEGATIVE
Urobilinogen, UA: 0.2 mg/dL (ref 0.0–1.0)

## 2010-02-25 LAB — POCT CARDIAC MARKERS
CKMB, poc: 5.2 ng/mL (ref 1.0–8.0)
Myoglobin, poc: 147 ng/mL (ref 12–200)

## 2010-02-25 LAB — TROPONIN I: Troponin I: 0.67 ng/mL (ref 0.00–0.06)

## 2010-02-26 LAB — BASIC METABOLIC PANEL
Calcium: 9.8 mg/dL (ref 8.4–10.5)
Creatinine, Ser: 0.78 mg/dL (ref 0.4–1.2)
GFR calc Af Amer: >60 mL/min (ref >60)
GFR calc non Af Amer: >60 mL/min (ref >60)
Glucose, Bld: 111 mg/dL — ABNORMAL HIGH (ref 70–99)
Sodium: 141 mEq/L (ref 135–145)

## 2010-02-26 LAB — CBC
HCT: 47.2 % — ABNORMAL HIGH (ref 36.0–46.0)
MCHC: 32.8 g/dL (ref 30.0–36.0)
MCV: 94.4 fL (ref 78.0–100.0)
Platelets: 366 10*3/uL (ref 150–400)
RDW: 13.8 % (ref 11.5–15.5)
WBC: 13.9 10*3/uL — ABNORMAL HIGH (ref 4.0–10.5)

## 2010-02-27 ENCOUNTER — Inpatient Hospital Stay (HOSPITAL_COMMUNITY)
Admission: EM | Admit: 2010-02-27 | Discharge: 2010-03-10 | DRG: 280 | Disposition: A | Discharge status: Skilled Nursing Facility | Payer: Medicare Other | Attending: Internal Medicine | Admitting: Internal Medicine

## 2010-02-27 DIAGNOSIS — N390 Urinary tract infection, site not specified: Secondary | ICD-10-CM | POA: Diagnosis present

## 2010-02-27 DIAGNOSIS — E871 Hypo-osmolality and hyponatremia: Secondary | ICD-10-CM | POA: Diagnosis present

## 2010-02-27 DIAGNOSIS — E872 Acidosis, unspecified: Secondary | ICD-10-CM | POA: Diagnosis present

## 2010-02-27 DIAGNOSIS — N170 Acute kidney failure with tubular necrosis: Secondary | ICD-10-CM | POA: Diagnosis present

## 2010-02-27 DIAGNOSIS — M109 Gout, unspecified: Secondary | ICD-10-CM | POA: Diagnosis present

## 2010-02-27 DIAGNOSIS — E875 Hyperkalemia: Secondary | ICD-10-CM | POA: Diagnosis present

## 2010-02-27 DIAGNOSIS — Z66 Do not resuscitate: Secondary | ICD-10-CM | POA: Diagnosis present

## 2010-02-27 DIAGNOSIS — J9600 Acute respiratory failure, unspecified whether with hypoxia or hypercapnia: Secondary | ICD-10-CM | POA: Diagnosis present

## 2010-02-27 DIAGNOSIS — I5031 Acute diastolic (congestive) heart failure: Principal | ICD-10-CM | POA: Diagnosis present

## 2010-02-27 DIAGNOSIS — R7309 Other abnormal glucose: Secondary | ICD-10-CM | POA: Diagnosis present

## 2010-02-27 DIAGNOSIS — I509 Heart failure, unspecified: Secondary | ICD-10-CM | POA: Diagnosis present

## 2010-02-27 DIAGNOSIS — I059 Rheumatic mitral valve disease, unspecified: Secondary | ICD-10-CM | POA: Diagnosis present

## 2010-02-27 DIAGNOSIS — I359 Nonrheumatic aortic valve disorder, unspecified: Secondary | ICD-10-CM | POA: Diagnosis present

## 2010-02-27 DIAGNOSIS — A498 Other bacterial infections of unspecified site: Secondary | ICD-10-CM | POA: Diagnosis present

## 2010-02-27 DIAGNOSIS — K2970 Gastritis, unspecified, without bleeding: Secondary | ICD-10-CM | POA: Diagnosis present

## 2010-02-27 DIAGNOSIS — K5732 Diverticulitis of large intestine without perforation or abscess without bleeding: Secondary | ICD-10-CM | POA: Diagnosis present

## 2010-02-27 DIAGNOSIS — K219 Gastro-esophageal reflux disease without esophagitis: Secondary | ICD-10-CM | POA: Diagnosis present

## 2010-02-27 DIAGNOSIS — M545 Low back pain, unspecified: Secondary | ICD-10-CM | POA: Diagnosis present

## 2010-02-27 DIAGNOSIS — N182 Chronic kidney disease, stage 2 (mild): Secondary | ICD-10-CM | POA: Diagnosis present

## 2010-02-27 DIAGNOSIS — D72829 Elevated white blood cell count, unspecified: Secondary | ICD-10-CM | POA: Diagnosis present

## 2010-02-27 DIAGNOSIS — A419 Sepsis, unspecified organism: Secondary | ICD-10-CM | POA: Diagnosis present

## 2010-02-27 DIAGNOSIS — I214 Non-ST elevation (NSTEMI) myocardial infarction: Secondary | ICD-10-CM | POA: Diagnosis present

## 2010-02-27 DIAGNOSIS — J45909 Unspecified asthma, uncomplicated: Secondary | ICD-10-CM | POA: Diagnosis present

## 2010-02-27 LAB — POCT I-STAT, CHEM 8
Creatinine, Ser: 2.5 mg/dL — ABNORMAL HIGH (ref 0.4–1.2)
HCT: 53 % — ABNORMAL HIGH (ref 36.0–46.0)
Hemoglobin: 18 g/dL — ABNORMAL HIGH (ref 12.0–15.0)
Potassium: 5.5 mEq/L — ABNORMAL HIGH (ref 3.5–5.1)
Sodium: 134 mEq/L — ABNORMAL LOW (ref 135–145)
TCO2: 18 mmol/L (ref 0–100)

## 2010-02-27 LAB — DIFFERENTIAL
Basophils Absolute: 0.3 10*3/uL — ABNORMAL HIGH (ref 0.0–0.1)
Basophils Relative: 1 % (ref 0–1)
Eosinophils Absolute: 0 10*3/uL (ref 0.0–0.7)
Lymphocytes Relative: 7 % — ABNORMAL LOW (ref 12–46)
Lymphs Abs: 2.2 10*3/uL (ref 0.7–4.0)
Monocytes Absolute: 1.6 10*3/uL — ABNORMAL HIGH (ref 0.1–1.0)
Neutro Abs: 27.1 10*3/uL — ABNORMAL HIGH (ref 1.7–7.7)

## 2010-02-27 LAB — COMPREHENSIVE METABOLIC PANEL
ALT: 59 U/L — ABNORMAL HIGH (ref 0–35)
AST: 41 U/L — ABNORMAL HIGH (ref 0–37)
Albumin: 3.1 g/dL — ABNORMAL LOW (ref 3.5–5.2)
Alkaline Phosphatase: 59 U/L (ref 39–117)
BUN: 74 mg/dL — ABNORMAL HIGH (ref 6–23)
CO2: 19 mEq/L (ref 19–32)
Calcium: 10.2 mg/dL (ref 8.4–10.5)
Chloride: 101 mEq/L (ref 96–112)
Chloride: 96 mEq/L (ref 96–112)
Creatinine, Ser: 2.29 mg/dL — ABNORMAL HIGH (ref 0.4–1.2)
GFR calc Af Amer: 25 mL/min — ABNORMAL LOW (ref >60)
GFR calc non Af Amer: 20 mL/min — ABNORMAL LOW (ref >60)
Glucose, Bld: 156 mg/dL — ABNORMAL HIGH (ref 70–99)
Potassium: 4.9 mEq/L (ref 3.5–5.1)
Total Bilirubin: 0.6 mg/dL (ref 0.3–1.2)
Total Bilirubin: 0.8 mg/dL (ref 0.3–1.2)

## 2010-02-27 LAB — BLOOD GAS, ARTERIAL
Drawn by: 30806
O2 Content: 2 L/min
O2 Saturation: 95.6 %
Patient temperature: 98.6

## 2010-02-27 LAB — URINE MICROSCOPIC-ADD ON

## 2010-02-27 LAB — TROPONIN I
Troponin I: 0.47 ng/mL — ABNORMAL HIGH (ref 0.00–0.06)
Troponin I: 0.35 ng/mL — ABNORMAL HIGH (ref 0.00–0.06)

## 2010-02-27 LAB — CBC
Hemoglobin: 16.2 g/dL — ABNORMAL HIGH (ref 12.0–15.0)
MCH: 31 pg (ref 26.0–34.0)
MCH: 31 pg (ref 26.0–34.0)
MCHC: 33.1 g/dL (ref 30.0–36.0)
MCHC: 33.8 g/dL (ref 30.0–36.0)
Platelets: 307 10*3/uL (ref 150–400)
Platelets: 367 10*3/uL (ref 150–400)
RDW: 13.5 % (ref 11.5–15.5)
RDW: 14 % (ref 11.5–15.5)

## 2010-02-27 LAB — GLUCOSE, CAPILLARY: Glucose-Capillary: 119 mg/dL — ABNORMAL HIGH (ref 70–99)

## 2010-02-27 LAB — URINALYSIS, ROUTINE W REFLEX MICROSCOPIC
Specific Gravity, Urine: 1.017 (ref 1.005–1.030)
Urine Glucose, Fasting: NEGATIVE mg/dL

## 2010-02-27 LAB — POCT CARDIAC MARKERS
CKMB, poc: 3.7 ng/mL (ref 1.0–8.0)
Myoglobin, poc: 417 ng/mL (ref 12–200)

## 2010-02-27 LAB — MRSA PCR SCREENING: MRSA by PCR: NEGATIVE

## 2010-02-27 LAB — RENAL FUNCTION PANEL
CO2: 19 mEq/L (ref 19–32)
Glucose, Bld: 142 mg/dL — ABNORMAL HIGH (ref 70–99)
Potassium: 4.7 mEq/L (ref 3.5–5.1)
Sodium: 142 mEq/L (ref 135–145)

## 2010-02-27 LAB — CK TOTAL AND CKMB (NOT AT ARMC)
CK, MB: 4.9 ng/mL — ABNORMAL HIGH (ref 0.3–4.0)
CK, MB: 5.2 ng/mL — ABNORMAL HIGH (ref 0.3–4.0)
Relative Index: INVALID (ref 0.0–2.5)

## 2010-02-27 LAB — GASTRIC OCCULT BLOOD (1-CARD TO LAB): Occult Blood, Gastric: POSITIVE — AB

## 2010-02-28 LAB — CBC
HCT: 39.2 % (ref 36.0–46.0)
MCV: 94.5 fL (ref 78.0–100.0)
Platelets: 277 10*3/uL (ref 150–400)
RBC: 4.15 MIL/uL (ref 3.87–5.11)
WBC: 31.6 10*3/uL — ABNORMAL HIGH (ref 4.0–10.5)

## 2010-02-28 LAB — GLUCOSE, CAPILLARY
Glucose-Capillary: 127 mg/dL — ABNORMAL HIGH (ref 70–99)
Glucose-Capillary: 171 mg/dL — ABNORMAL HIGH (ref 70–99)
Glucose-Capillary: 99 mg/dL (ref 70–99)
Glucose-Capillary: 105 mg/dL — ABNORMAL HIGH (ref 70–99)

## 2010-02-28 LAB — DIFFERENTIAL
Basophils Relative: 0 % (ref 0–1)
Eosinophils Absolute: 0 10*3/uL (ref 0.0–0.7)
Eosinophils Relative: 0 % (ref 0–5)
Lymphocytes Relative: 3 % — ABNORMAL LOW (ref 12–46)
Neutrophils Relative %: 93 % — ABNORMAL HIGH (ref 43–77)

## 2010-02-28 LAB — PROCALCITONIN: Procalcitonin: 2.02 ng/mL

## 2010-02-28 LAB — RENAL FUNCTION PANEL
GFR calc Af Amer: 52 mL/min — ABNORMAL LOW (ref >60)
GFR calc non Af Amer: 43 mL/min — ABNORMAL LOW (ref >60)
Glucose, Bld: 119 mg/dL — ABNORMAL HIGH (ref 70–99)
Phosphorus: 4.5 mg/dL (ref 2.3–4.6)
Potassium: 3.6 mEq/L (ref 3.5–5.1)
Sodium: 145 mEq/L (ref 135–145)

## 2010-02-28 LAB — CULTURE, BLOOD (ROUTINE X 2): Culture: NO GROWTH

## 2010-03-01 LAB — DIFFERENTIAL
Eosinophils Relative: 0 % (ref 0–5)
Lymphocytes Relative: 5 % — ABNORMAL LOW (ref 12–46)
Lymphs Abs: 1 10*3/uL (ref 0.7–4.0)
Monocytes Absolute: 0.8 10*3/uL (ref 0.1–1.0)
Monocytes Relative: 4 % (ref 3–12)
Neutro Abs: 17.4 10*3/uL — ABNORMAL HIGH (ref 1.7–7.7)

## 2010-03-01 LAB — COMPREHENSIVE METABOLIC PANEL
AST: 18 U/L (ref 0–37)
CO2: 21 mEq/L (ref 19–32)
Calcium: 7.4 mg/dL — ABNORMAL LOW (ref 8.4–10.5)
Chloride: 122 mEq/L — ABNORMAL HIGH (ref 96–112)
Creatinine, Ser: 0.71 mg/dL (ref 0.4–1.2)
GFR calc non Af Amer: >60 mL/min (ref >60)
Glucose, Bld: 73 mg/dL (ref 70–99)
Total Bilirubin: 0.6 mg/dL (ref 0.3–1.2)

## 2010-03-01 LAB — CBC
HCT: 32.8 % — ABNORMAL LOW (ref 36.0–46.0)
Hemoglobin: 10.5 g/dL — ABNORMAL LOW (ref 12.0–15.0)
MCH: 30.4 pg (ref 26.0–34.0)
MCHC: 32 g/dL (ref 30.0–36.0)
MCV: 95.1 fL (ref 78.0–100.0)
RDW: 14.4 % (ref 11.5–15.5)

## 2010-03-01 LAB — GLUCOSE, CAPILLARY
Glucose-Capillary: 131 mg/dL — ABNORMAL HIGH (ref 70–99)
Glucose-Capillary: 109 mg/dL — ABNORMAL HIGH (ref 70–99)
Glucose-Capillary: 90 mg/dL (ref 70–99)
Glucose-Capillary: 99 mg/dL (ref 70–99)
Glucose-Capillary: 132 mg/dL — ABNORMAL HIGH (ref 70–99)

## 2010-03-02 LAB — CBC
HCT: 33 % — ABNORMAL LOW (ref 36.0–46.0)
MCV: 94.8 fL (ref 78.0–100.0)
Platelets: 214 10*3/uL (ref 150–400)
RBC: 3.48 MIL/uL — ABNORMAL LOW (ref 3.87–5.11)
RDW: 14.5 % (ref 11.5–15.5)
WBC: 14.9 10*3/uL — ABNORMAL HIGH (ref 4.0–10.5)

## 2010-03-02 LAB — BASIC METABOLIC PANEL
BUN: 14 mg/dL (ref 6–23)
Chloride: 115 mEq/L — ABNORMAL HIGH (ref 96–112)
GFR calc non Af Amer: >60 mL/min (ref >60)
Potassium: 3.4 mEq/L — ABNORMAL LOW (ref 3.5–5.1)
Sodium: 144 mEq/L (ref 135–145)

## 2010-03-02 LAB — GLUCOSE, CAPILLARY
Glucose-Capillary: 86 mg/dL (ref 70–99)
Glucose-Capillary: 98 mg/dL (ref 70–99)
Glucose-Capillary: 102 mg/dL — ABNORMAL HIGH (ref 70–99)
Glucose-Capillary: 95 mg/dL (ref 70–99)

## 2010-03-02 LAB — URINE CULTURE

## 2010-03-02 LAB — MAGNESIUM: Magnesium: 2.4 mg/dL (ref 1.5–2.5)

## 2010-03-03 LAB — GLUCOSE, CAPILLARY
Glucose-Capillary: 117 mg/dL — ABNORMAL HIGH (ref 70–99)
Glucose-Capillary: 121 mg/dL — ABNORMAL HIGH (ref 70–99)
Glucose-Capillary: 85 mg/dL (ref 70–99)
Glucose-Capillary: 88 mg/dL (ref 70–99)

## 2010-03-03 LAB — CBC
HCT: 35.7 % — ABNORMAL LOW (ref 36.0–46.0)
Hemoglobin: 11.1 g/dL — ABNORMAL LOW (ref 12.0–15.0)
MCH: 29.9 pg (ref 26.0–34.0)
MCH: 30.3 pg (ref 26.0–34.0)
MCV: 93.9 fL (ref 78.0–100.0)
Platelets: 218 10*3/uL (ref 150–400)
Platelets: 224 10*3/uL (ref 150–400)
RBC: 3.71 MIL/uL — ABNORMAL LOW (ref 3.87–5.11)
RDW: 14.3 % (ref 11.5–15.5)
WBC: 12.2 10*3/uL — ABNORMAL HIGH (ref 4.0–10.5)
WBC: 14.1 10*3/uL — ABNORMAL HIGH (ref 4.0–10.5)

## 2010-03-03 LAB — DIFFERENTIAL
Eosinophils Absolute: 0 10*3/uL (ref 0.0–0.7)
Eosinophils Relative: 0 % (ref 0–5)
Lymphocytes Relative: 8 % — ABNORMAL LOW (ref 12–46)
Lymphs Abs: 1.1 10*3/uL (ref 0.7–4.0)
Monocytes Relative: 5 % (ref 3–12)

## 2010-03-03 LAB — RENAL FUNCTION PANEL
Albumin: 2.2 g/dL — ABNORMAL LOW (ref 3.5–5.2)
BUN: 7 mg/dL (ref 6–23)
Chloride: 109 mEq/L (ref 96–112)
Creatinine, Ser: 0.39 mg/dL — ABNORMAL LOW (ref 0.4–1.2)
Glucose, Bld: 93 mg/dL (ref 70–99)
Phosphorus: 1.5 mg/dL — ABNORMAL LOW (ref 2.3–4.6)
Potassium: 3.8 mEq/L (ref 3.5–5.1)

## 2010-03-03 LAB — COMPREHENSIVE METABOLIC PANEL
ALT: 17 U/L (ref 0–35)
AST: 20 U/L (ref 0–37)
Albumin: 2.2 g/dL — ABNORMAL LOW (ref 3.5–5.2)
CO2: 21 mEq/L (ref 19–32)
Chloride: 111 mEq/L (ref 96–112)
Creatinine, Ser: 0.52 mg/dL (ref 0.4–1.2)
GFR calc Af Amer: >60 mL/min (ref >60)
Potassium: 3.3 mEq/L — ABNORMAL LOW (ref 3.5–5.1)
Sodium: 142 mEq/L (ref 135–145)
Total Bilirubin: 0.5 mg/dL (ref 0.3–1.2)

## 2010-03-04 LAB — GLUCOSE, CAPILLARY
Glucose-Capillary: 98 mg/dL (ref 70–99)
Glucose-Capillary: 98 mg/dL (ref 70–99)
Glucose-Capillary: 89 mg/dL (ref 70–99)
Glucose-Capillary: 94 mg/dL (ref 70–99)

## 2010-03-04 LAB — RENAL FUNCTION PANEL
Albumin: 2.4 g/dL — ABNORMAL LOW (ref 3.5–5.2)
BUN: 3 mg/dL — ABNORMAL LOW (ref 6–23)
Calcium: 7.7 mg/dL — ABNORMAL LOW (ref 8.4–10.5)
Creatinine, Ser: 0.53 mg/dL (ref 0.4–1.2)
GFR calc Af Amer: >60 mL/min (ref >60)
GFR calc non Af Amer: >60 mL/min (ref >60)
Phosphorus: 1.5 mg/dL — ABNORMAL LOW (ref 2.3–4.6)

## 2010-03-04 NOTE — Miscellaneous (Signed)
Summary: Orders/[facility]  Orders/[facility]   Imported By: Lester Fairview 05/21/2009 10:57:40  _____________________________________________________________________  External Attachment:    Type:   Image     Comment:   External Document

## 2010-03-04 NOTE — Progress Notes (Signed)
Summary: waiting on refills  Phone Note Call from Patient   Caller: Patient Call For: NADEL Summary of Call: pt states that [facility] has sent requests for refills for fosamax (generic) and another rx that she doesn't remember the name of. pt wants nurse to call pharm cause she is out and has been waiting for these.  Initial call taken by: Tivis Ringer, CNA,  April 08, 2009 12:10 PM  Follow-up for Phone Call        Pt informed that refills were sent to [facility]. Abigail Miyamoto RN  April 08, 2009 12:17 PM     Prescriptions: OMEPRAZOLE 20 MG  CPDR (OMEPRAZOLE) 1 tab daily taken 30 min before a meal.  #42 Each x 3   Entered by:   Abigail Miyamoto RN   Authorized by:   Michele Mcalpine MD   Signed by:   Abigail Miyamoto RN on 04/08/2009   Method used:   Electronically to        [facility]* (retail)       [address]       [place], [place]  [id number]       Ph: [phone]       Fax: [phone]   RxID:   [id number] FOSAMAX 70 MG TABS (ALENDRONATE SODIUM) take one tablet by mouth once weekly  #4 Each x 6   Entered by:   Abigail Miyamoto RN   Authorized by:   Michele Mcalpine MD   Signed by:   Abigail Miyamoto RN on 04/08/2009   Method used:   Electronically to        [facility]* (retail)       [address]       [place], [place]  [address]       Ph: [phone]       Fax: [phone]   RxID:   [id number] TORSEMIDE 20 MG  TABS (TORSEMIDE) 1 tab by mouth once daily  #30 Each x 6   Entered by:   Abigail Miyamoto RN   Authorized by:   Michele Mcalpine MD   Signed by:   Abigail Miyamoto RN on 04/08/2009   Method used:   Electronically to        [facility]* (retail)       [address]       [place], [place]  [id number]       Ph: [phone]       Fax: [phone]   RxID:   [id number]

## 2010-03-04 NOTE — Progress Notes (Signed)
Summary: results  Phone Note Call from Patient Call back at Home Phone [phone]   Caller: Patient Call For: Andrea Rubio Summary of Call: pt wants results from labs. says she is especially concerned about her iron levels. says she is still "wobbly". pt was seen yesterday Initial call taken by: Tivis Ringer, CNA,  May 03, 2009 12:27 PM  Follow-up for Phone Call        Pt is requesting lab results drawn yesterday. TP out of office until Tuesday and pt is very anxious for results. Please advise. Carron Curie CMA  May 03, 2009 1:52 PM  per TP, HGB is okay.  iron is low, begin OTC iron 365mg  two times a day.  ? uti, culture is pending.  called spoke with patient.  advised pt of lab results/recs as stated by TP.  pt verbalized his/her understanding; will call pt next week with urine results. Boone Master CNA  May 03, 2009 4:33 PM     New/Updated Medications: FERROUS SULFATE 325 (65 FE) MG TABS (FERROUS SULFATE) Take 1 tablet by mouth two times a day

## 2010-03-04 NOTE — Progress Notes (Signed)
Summary: back pain  Phone Note Call from Patient   Caller: Patient Call For: parrett Summary of Call: pt having back pain would like ov today Initial call taken by: Rickard Patience,  February 19, 2009 9:44 AM  Follow-up for Phone Call        Pt states she ahs been feeling bad all weekend. She c/o dull ache in her upper back, and chest congestion with productive cough with greyish phlegm. Please advise.Carron Curie CMA  February 19, 2009 10:03 AM    please offer appt with TP this afternoon   thanks Randell Loop [facility]  February 19, 2009 11:11 AM   Additional Follow-up for Phone Call Additional follow up Details #1::        called and spoke with pt.  pt scheduled to see TP today at 4:15pm.  Arman Filter LPN  February 19, 2009 11:14 AM

## 2010-03-04 NOTE — Assessment & Plan Note (Signed)
Summary: Acute NP office visit - congestion   CC:  prod cough with gray mucus, increased SOB, and back discomfort x4days.  History of Present Illness: 77 year old female with known history of asthma, steroid dependent maintained on Prednisone 5mg  once daily , Advair 500/50 bid and Xopenex neb tid.    ~  seen 8/13:  s/p hosp 8/3-8/09 w/ T 104, bilat LL infiltrates, neg c/s, neg strep & legionella, neg H1N1, and improved w/ broad spectrum antibiotics, solumedrol, bronchdil, O2, etc... disch on Avelox + her usual max home regimen... she was improved- just "weak"... CXR cleared to baseline... she has [facility] w/ visiting nurses, PT, DME needs, etc...    ~  November 04, 2007:  follow up with flare of symptoms. rx Avelox x 7 days  November 17, 2007--returns with persistent cough, Got better but never back to baseline. Left rib pain started this am, sore to touch, painful with movement.    November 25, 2007 cough is better  but still has rib pain and skin  sl better under breast. --tx with cough suppression regimen--tessalon perles. CXR shows chronic changes.   December 15, 2007 ---Complains of cough for 4 days with yellow /green mucus- mostly in am. "Not as bad as usual" no significant wheezing.    February 14, 2008--c/o left rib pain- will not go away over last several months, CT chest and cxr with no acute changes. increased heartburn, indigestion-relieved with tums. Has been eating "junk food" -not eating right over holidays. No exertional chest pain. Blood vessel burst in left eye 5 days ago, no visual changes or headache. Denies chest pain, dyspnea, orthopnea, hemoptysis, fever, n/v/d, edema.  February 19, 2009 --Presents for an acute office visit. Complains of 4 days prod cough with gray mucus, increased SOB, back discomfort x4days. OTC not helping. Cough bad yesterday. Starting to feel bad for last 2 days. No energy., weak. Denies chest pain,, orthopnea, hemoptysis, fever, n/v/d, edema, headache,recent  travel or antibiotics.      Medications Prior to Update: 1)  Xopenex 1.25 Mg/4ml  Nebu (Levalbuterol Hcl) .... Use Via Nebulizer Three Times A Day 2)  Advair Diskus 250-50 Mcg/dose  Misc (Fluticasone-Salmeterol) .Marland Kitchen.. 1 Inhalation Two Times A Day 3)  Prednisone 5 Mg  Tabs (Prednisone) .... Take 1 Tablet By Mouth Once A Day 4)  Verapamil Hcl Cr 240 Mg  Tbcr (Verapamil Hcl) .Marland Kitchen.. 1 Tab By Mouth Once Daily 5)  Torsemide 20 Mg  Tabs (Torsemide) .Marland Kitchen.. 1 Tab By Mouth Once Daily 6)  Potassium Chloride Crys Cr 20 Meq  Tbcr (Potassium Chloride Crys Cr) .Marland Kitchen.. 1 Tab Daily 7)  Omeprazole 20 Mg  Cpdr (Omeprazole) .Marland Kitchen.. 1 Tab Daily Taken 30 Min Before A Meal. 8)  Reglan 10 Mg  Tabs (Metoclopramide Hcl) .... Take 1 Tab By Mouth At Bedtime 9)  Miralax  Powd (Polyethylene Glycol 3350) .... Dissolve One Capful in [employer] Daily 10)  Uloric 40 Mg Tabs (Febuxostat) .... Take 1 Tab By Mouth Once Daily... 11)  Fosamax 70 Mg Tabs (Alendronate Sodium) .... Take One Tablet By Mouth Once Weekly 12)  Caltrate 600+d 600-400 Mg-Unit  Tabs (Calcium Carbonate-Vitamin D) .Marland Kitchen.. 1 Tab By Mouth Two Times A Day For Bones... 13)  Multivitamins   Tabs (Multiple Vitamin) .... Take 1 Tablet By Mouth Once A Day 14)  Vitamin D3 1000 Unit Caps (Cholecalciferol) .... Take 1 Cap By Mouth Once Daily... 15)  Amitriptyline Hcl 25 Mg Tabs (Amitriptyline Hcl) .... Take 1  Tab By Mouth At Bedtime 16)  Alprazolam 0.25 Mg Tabs (Alprazolam) .Marland Kitchen.. 1 By Mouth Once Daily As Needed Nerves 17)  Neurontin 100 Mg Caps (Gabapentin) .... As Directed By Drlewitt 18)  Lotrisone 1-0.05 % Crea (Clotrimazole-Betamethasone) .... Apply To Rash Two Times A Day  Current Medications (verified): 1)  Xopenex 1.25 Mg/57ml  Nebu (Levalbuterol Hcl) .... Use Via Nebulizer Three Times A Day 2)  Advair Diskus 250-50 Mcg/dose  Misc (Fluticasone-Salmeterol) .Marland Kitchen.. 1 Inhalation Two Times A Day 3)  Prednisone 5 Mg  Tabs (Prednisone) .... Take 1 Tablet By Mouth Once A Day 4)   Verapamil Hcl Cr 240 Mg  Tbcr (Verapamil Hcl) .Marland Kitchen.. 1 Tab By Mouth Once Daily 5)  Torsemide 20 Mg  Tabs (Torsemide) .Marland Kitchen.. 1 Tab By Mouth Once Daily 6)  Potassium Chloride Crys Cr 20 Meq  Tbcr (Potassium Chloride Crys Cr) .Marland Kitchen.. 1 Tab Daily 7)  Omeprazole 20 Mg  Cpdr (Omeprazole) .Marland Kitchen.. 1 Tab Daily Taken 30 Min Before A Meal. 8)  Reglan 10 Mg  Tabs (Metoclopramide Hcl) .... Take 1 Tab By Mouth At Bedtime 9)  Miralax  Powd (Polyethylene Glycol 3350) .... Dissolve One Capful in [employer] Daily 10)  Uloric 40 Mg Tabs (Febuxostat) .... Take 1 Tab By Mouth Once Daily... 11)  Fosamax 70 Mg Tabs (Alendronate Sodium) .... Take One Tablet By Mouth Once Weekly 12)  Caltrate 600+d 600-400 Mg-Unit  Tabs (Calcium Carbonate-Vitamin D) .Marland Kitchen.. 1 Tab By Mouth Two Times A Day For Bones... 13)  Multivitamins   Tabs (Multiple Vitamin) .... Take 1 Tablet By Mouth Once A Day 14)  Vitamin D3 1000 Unit Caps (Cholecalciferol) .... Take 1 Cap By Mouth Once Daily... 15)  Amitriptyline Hcl 25 Mg Tabs (Amitriptyline Hcl) .... Take 1 Tab By Mouth At Bedtime 16)  Alprazolam 0.25 Mg Tabs (Alprazolam) .Marland Kitchen.. 1 By Mouth Once Daily As Needed Nerves 17)  Neurontin 100 Mg Caps (Gabapentin) .... As Directed By Drlewitt 18)  Lotrisone 1-0.05 % Crea (Clotrimazole-Betamethasone) .... Apply To Rash Two Times A Day 19)  Ipratropium Bromide 0.06 % Soln (Ipratropium Bromide) .... Inhale 1 Vial Via Hhn Three Times A Day 20)  Albuterol Sulfate (2.5 Mg/31ml) 0.083% Nebu (Albuterol Sulfate) .... Inhale 1 Vial Via Hhn Three Times A Day  Allergies (verified): 1)  ! Codeine 2)  ! Allopurinol 3)  ! Morphine 4)  ! Hydrocodone  Past History:  Past Medical History: Last updated: 01/10/2009 MACULAR DEGENERATION (ICD-362.50) SINUSITIS, CHRONIC (ICD-473.9) ASTHMATIC BRONCHITIS, ACUTE (ICD-466.0) Hx of PNEUMONIA (ICD-486) HYPERTENSION (ICD-401.9) AORTIC STENOSIS (ICD-424.1) Hx of CHF (ICD-428.0) PERIPHERAL VASCULAR DISEASE (ICD-443.9) VENOUS  INSUFFICIENCY (ICD-459.81) HYPERLIPIDEMIA (ICD-272.4) HIATAL HERNIA (ICD-553.3) GERD (ICD-530.81) DIVERTICULOSIS, COLON (ICD-562.10) STRESS INCONTINENCE (ICD-788.39) DEGENERATIVE JOINT DISEASE (ICD-715.90) GOUT, UNSPECIFIED (ICD-274.9) LOW BACK PAIN (ICD-724.2) NECK PAIN (ICD-723.1) OSTEOPOROSIS (ICD-733.00) ANXIETY (ICD-300.00) ANEMIA (ICD-285.9)  Past Surgical History: Last updated: 01/10/2009 S/P lumbar laminectomy S/P bilat shoulder surgeries  Family History: Last updated: 02/19/2009 heart disease - brother rheumatism - mother stroke -  father HTN - mother, father, all 4 brothers, both sisters  Social History: Last updated: 02/19/2009 never smoked no alcohol 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  Risk Factors: Smoking Status: never (01/04/2008)  Family History: heart disease - brother rheumatism - mother stroke -  father HTN - mother, father, all 4 brothers, both sisters  Social History: never smoked no alcohol 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 No Comments Medications reviewed with patient Daytime contact number verified with patient. Boone Master CNA  February 19, 2009 4:14 PM    Physical Exam  Additional Exam:  WD, Petite, sl overweight, 77 y/o WF in NAD... she is <5' tall, weight 134lbs  >139 GENERAL:  Alert & oriented; pleasant & cooperative... HEENT:  [place]/AT, EACs-clear, TMs-wnl, NOSE-clear, THROAT-clear & wnl... known macular degen per ophthal... NECK:   Supple w/ fairROM; no JVD; normal carotid impulses w/o bruits; no thyromegaly or nodules palpated; no lymphadenopathy. CHEST:  Decr BS bilat, Coarse BS no wheezing or rhonchi.  HEART:  Regular Rhythm; gr 1-2/6 SEM without rubs or gallops detected... ABDOMEN:  Soft & nontender; normal bowel sounds; no organomegaly or masses palpated... EXT: +hand deformities, w/ tophi & mod arthritic changes & contractures... right knee arthritis & severe foot deformities as well. no varicose veins/+venous insuffic/ tr edema. NEURO:  CN's intact x reduced vision... diffusely weak, without focal changes... +gait abn... DERM:  no lesions- ecchymoses, thin skin, etc...    Impression & Recommendations:  Problem # 1:  ASTHMATIC BRONCHITIS, ACUTE (ICD-466.0)  Augmentin 875mg  two times a day for 7 days with food Mucinex DM two times a day as needed cough/congestion Increase fluids.  Please contact office for sooner follow up if symptoms do not improve or worsen  follow up Dr. Kriste Basque as scheduled.  Her updated medication list for this problem includes:    Xopenex 1.25 Mg/85ml Nebu (Levalbuterol hcl) ..... Use via nebulizer three times a day    Advair Diskus 250-50 Mcg/dose Misc (Fluticasone-salmeterol) .Marland Kitchen... 1 inhalation two times a day    Albuterol Sulfate (2.5 Mg/77ml) 0.083% Nebu (Albuterol sulfate) ..... Inhale 1 vial via hhn three times a day    Amoxicillin-pot Clavulanate 875-125 Mg Tabs (Amoxicillin-pot clavulanate) .Marland Kitchen... 1 by mouth two times a day  Orders: Est. Patient Level IV (08657)  Problem # 2:  HYPERTENSION (ICD-401.9)  B/P on low normal side, will hold for couple of days to avoid hypotension (esp during acute illness) Hold Verapamil for 2 days, then restart.  Pt to check b/p daily , keep log and call if b/p >150 or <100.    Her updated medication list for this problem includes:    Verapamil Hcl Cr 240 Mg Tbcr (Verapamil hcl) .Marland Kitchen... 1 tab by mouth once daily    Torsemide 20 Mg Tabs (Torsemide)  .Marland Kitchen... 1 tab by mouth once daily  Orders: Est. Patient Level IV (84696)  Medications Added to Medication List This Visit: 1)  Ipratropium Bromide 0.06 % Soln (Ipratropium bromide) .... Inhale 1 vial via hhn three times a day 2)  Albuterol Sulfate (2.5 Mg/82ml) 0.083% Nebu (Albuterol sulfate) .... Inhale 1 vial via hhn three times a day 3)  Amoxicillin-pot Clavulanate 875-125 Mg Tabs (Amoxicillin-pot clavulanate) .Marland Kitchen.. 1 by mouth two times a day  Complete Medication List: 1)  Xopenex 1.25 Mg/52ml Nebu (Levalbuterol hcl) .Marland KitchenMarland KitchenMarland Kitchen  Use via nebulizer three times a day 2)  Advair Diskus 250-50 Mcg/dose Misc (Fluticasone-salmeterol) .Marland Kitchen.. 1 inhalation two times a day 3)  Prednisone 5 Mg Tabs (Prednisone) .... Take 1 tablet by mouth once a day 4)  Verapamil Hcl Cr 240 Mg Tbcr (Verapamil hcl) .Marland Kitchen.. 1 tab by mouth once daily 5)  Torsemide 20 Mg Tabs (Torsemide) .Marland Kitchen.. 1 tab by mouth once daily 6)  Potassium Chloride Crys Cr 20 Meq Tbcr (Potassium chloride crys cr) .Marland Kitchen.. 1 tab daily 7)  Omeprazole 20 Mg Cpdr (Omeprazole) .Marland Kitchen.. 1 tab daily taken 30 min before a meal. 8)  Reglan 10 Mg Tabs (Metoclopramide hcl) .... Take 1 tab by mouth at bedtime 9)  Miralax Powd (Polyethylene glycol 3350) .... Dissolve one capful in 8oz water daily 10)  Uloric 40 Mg Tabs (Febuxostat) .... Take 1 tab by mouth once daily... 11)  Fosamax 70 Mg Tabs (Alendronate sodium) .... Take one tablet by mouth once weekly 12)  Caltrate 600+d 600-400 Mg-unit Tabs (Calcium carbonate-vitamin d) .Marland Kitchen.. 1 tab by mouth two times a day for bones... 13)  Multivitamins Tabs (Multiple vitamin) .... Take 1 tablet by mouth once a day 14)  Vitamin D3 1000 Unit Caps (Cholecalciferol) .... Take 1 cap by mouth once daily... 15)  Amitriptyline Hcl 25 Mg Tabs (Amitriptyline hcl) .... Take 1 tab by mouth at bedtime 16)  Alprazolam 0.25 Mg Tabs (Alprazolam) .Marland Kitchen.. 1 by mouth once daily as needed nerves 17)  Neurontin 100 Mg Caps (Gabapentin) .... As directed by  drlewitt 18)  Lotrisone 1-0.05 % Crea (Clotrimazole-betamethasone) .... Apply to rash two times a day 19)  Ipratropium Bromide 0.06 % Soln (Ipratropium bromide) .... Inhale 1 vial via hhn three times a day 20)  Albuterol Sulfate (2.5 Mg/74ml) 0.083% Nebu (Albuterol sulfate) .... Inhale 1 vial via hhn three times a day 21)  Amoxicillin-pot Clavulanate 875-125 Mg Tabs (Amoxicillin-pot clavulanate) .Marland Kitchen.. 1 by mouth two times a day  Patient Instructions: 1)  Hold Verapamil for 2 days, then restart.  2)  Augmentin 875mg  two times a day for 7 days with food 3)  Mucinex DM two times a day as needed cough/congestion 4)  Increase fluids.  5)  Please contact office for sooner follow up if symptoms do not improve or worsen  6)  follow up Dr. Kriste Basque as scheduled.  Prescriptions: AMOXICILLIN-POT CLAVULANATE 875-125 MG TABS (AMOXICILLIN-POT CLAVULANATE) 1 by mouth two times a day  #14 x 0   Entered and Authorized by:   Rubye Oaks NP   Signed by:   Rubye Oaks NP on 02/19/2009   Method used:   Electronically to        [facility]* (retail)       [address]       [place], [place]  [id number]       Ph: [phone]       Fax: [phone]   RxID:   [id number]    Immunization History:  Influenza Immunization History:    Influenza:  historical (11/02/2008)  Pneumovax Immunization History:    Pneumovax:  historical (02/02/1998)

## 2010-03-04 NOTE — Medication Information (Signed)
Summary: Ipratropium / [employer]  Ipratropium / [employer]   Imported By: Lennie Odor 08/02/2009 13:56:23  _____________________________________________________________________  External Attachment:    Type:   Image     Comment:   External Document

## 2010-03-04 NOTE — Assessment & Plan Note (Signed)
Summary: Acute NP office visit - unsteady gait   CC:  arm injury wants to make sure she has not lost too much blood .  History of Present Illness: 77 year old female with known history of asthma, steroid dependent maintained on Prednisone 5mg  once daily , Advair 500/50 bid and Xopenex neb tid.    ~  seen 8/13:  s/p hosp 8/3-8/09 w/ T 104, bilat LL infiltrates, neg c/s, neg strep & legionella, neg H1N1, and improved w/ broad spectrum antibiotics, solumedrol, bronchdil, O2, etc... disch on Avelox + her usual max home regimen... she was improved- just "weak"... CXR cleared to baseline... she has [facility] w/ visiting nurses, PT, DME needs, etc...   December 15, 2007 ---Complains of cough for 4 days with yellow /green mucus- mostly in am. "Not as bad as usual" no significant wheezing.    February 14, 2008--c/o left rib pain- will not go away over last several months, CT chest and cxr with no acute changes. increased heartburn, indigestion-relieved with tums. Has been eating "junk food" -not eating right over holidays. No exertional chest pain. Blood vessel burst in left eye 5 days ago, no visual changes or headache. Denies chest pain, dyspnea, orthopnea, hemoptysis, fever, n/v/d, edema.  February 19, 2009 --Presents for an acute office visit. Complains of 4 days prod cough with gray mucus, increased SOB, back discomfort x4days. OTC not helping. Cough bad yesterday. Starting to feel bad for last 2 days. No energy., weak.    May 02, 2009--Presents for an acute office visit. Pt tripped as she was going in house 1 week ago, and son reached to help her. She hit arm along the way w/ skin tear. She has been putting telfa bandage on it. Has not noticed any redness or fever. She feels more tired than usual and not as steady, concerned she has lost too much blood. The area bled alot at first, minimal drainage now. Denies chest pain, orthopnea, hemoptysis, fever, n/v/d, edema, headache.   Medications Prior to  Update: 1)  Xopenex 1.25 Mg/38ml  Nebu (Levalbuterol Hcl) .... Use Via Nebulizer Three Times A Day 2)  Advair Diskus 250-50 Mcg/dose  Misc (Fluticasone-Salmeterol) .Marland Kitchen.. 1 Inhalation Two Times A Day 3)  Prednisone 5 Mg  Tabs (Prednisone) .... Take 1 Tablet By Mouth Once A Day 4)  Verapamil Hcl Cr 240 Mg  Tbcr (Verapamil Hcl) .Marland Kitchen.. 1 Tab By Mouth Once Daily 5)  Torsemide 20 Mg  Tabs (Torsemide) .Marland Kitchen.. 1 Tab By Mouth Once Daily 6)  Potassium Chloride Crys Cr 20 Meq  Tbcr (Potassium Chloride Crys Cr) .Marland Kitchen.. 1 Tab Daily 7)  Omeprazole 20 Mg  Cpdr (Omeprazole) .Marland Kitchen.. 1 Tab Daily Taken 30 Min Before A Meal. 8)  Reglan 10 Mg  Tabs (Metoclopramide Hcl) .... Take 1 Tab By Mouth At Bedtime 9)  Miralax  Powd (Polyethylene Glycol 3350) .... Dissolve One Capful in [employer] Daily 10)  Uloric 40 Mg Tabs (Febuxostat) .... Take 1 Tab By Mouth Once Daily... 11)  Fosamax 70 Mg Tabs (Alendronate Sodium) .... Take One Tablet By Mouth Once Weekly 12)  Caltrate 600+d 600-400 Mg-Unit  Tabs (Calcium Carbonate-Vitamin D) .Marland Kitchen.. 1 Tab By Mouth Two Times A Day For Bones... 13)  Multivitamins   Tabs (Multiple Vitamin) .... Take 1 Tablet By Mouth Once A Day 14)  Vitamin D3 1000 Unit Caps (Cholecalciferol) .... Take 1 Cap By Mouth Once Daily... 15)  Amitriptyline Hcl 25 Mg Tabs (Amitriptyline Hcl) .... Take 1 Tab By Mouth  At Bedtime 16)  Alprazolam 0.25 Mg Tabs (Alprazolam) .Marland Kitchen.. 1 By Mouth Once Daily As Needed Nerves 17)  Neurontin 100 Mg Caps (Gabapentin) .... As Directed By Drlewitt 18)  Lotrisone 1-0.05 % Crea (Clotrimazole-Betamethasone) .... Apply To Rash Two Times A Day 19)  Ipratropium Bromide 0.06 % Soln (Ipratropium Bromide) .... Inhale 1 Vial Via Hhn Three Times A Day 20)  Albuterol Sulfate (2.5 Mg/60ml) 0.083% Nebu (Albuterol Sulfate) .... Inhale 1 Vial Via Hhn Three Times A Day 21)  Amoxicillin-Pot Clavulanate 875-125 Mg Tabs (Amoxicillin-Pot Clavulanate) .Marland Kitchen.. 1 By Mouth Two Times A Day  Current Medications (verified): 1)   Xopenex 1.25 Mg/45ml  Nebu (Levalbuterol Hcl) .... Use Via Nebulizer Three Times A Day 2)  Advair Diskus 250-50 Mcg/dose  Misc (Fluticasone-Salmeterol) .Marland Kitchen.. 1 Inhalation Two Times A Day 3)  Prednisone 5 Mg  Tabs (Prednisone) .... Take 1 Tablet By Mouth Once A Day 4)  Verapamil Hcl Cr 240 Mg  Tbcr (Verapamil Hcl) .Marland Kitchen.. 1 Tab By Mouth Once Daily 5)  Torsemide 20 Mg  Tabs (Torsemide) .Marland Kitchen.. 1 Tab By Mouth Once Daily 6)  Potassium Chloride Crys Cr 20 Meq  Tbcr (Potassium Chloride Crys Cr) .Marland Kitchen.. 1 Tab Daily 7)  Omeprazole 20 Mg  Cpdr (Omeprazole) .Marland Kitchen.. 1 Tab Daily Taken 30 Min Before A Meal. 8)  Reglan 10 Mg  Tabs (Metoclopramide Hcl) .... Take 1 Tab By Mouth At Bedtime 9)  Miralax  Powd (Polyethylene Glycol 3350) .... Dissolve One Capful in [employer] Daily 10)  Uloric 40 Mg Tabs (Febuxostat) .... Take 1 Tab By Mouth Once Daily... 11)  Fosamax 70 Mg Tabs (Alendronate Sodium) .... Take One Tablet By Mouth Once Weekly 12)  Caltrate 600+d 600-400 Mg-Unit  Tabs (Calcium Carbonate-Vitamin D) .Marland Kitchen.. 1 Tab By Mouth Two Times A Day For Bones... 13)  Multivitamins   Tabs (Multiple Vitamin) .... Take 1 Tablet By Mouth Once A Day 14)  Vitamin D3 1000 Unit Caps (Cholecalciferol) .... Take 1 Cap By Mouth Once Daily... 15)  Amitriptyline Hcl 25 Mg Tabs (Amitriptyline Hcl) .... Take 1 Tab By Mouth At Bedtime 16)  Alprazolam 0.25 Mg Tabs (Alprazolam) .Marland Kitchen.. 1 By Mouth Once Daily As Needed Nerves 17)  Neurontin 100 Mg Caps (Gabapentin) .... As Directed By Drlewitt 18)  Lotrisone 1-0.05 % Crea (Clotrimazole-Betamethasone) .... Apply To Rash Two Times A Day 19)  Ipratropium Bromide 0.06 % Soln (Ipratropium Bromide) .... Inhale 1 Vial Via Hhn Three Times A Day 20)  Albuterol Sulfate (2.5 Mg/51ml) 0.083% Nebu (Albuterol Sulfate) .... Inhale 1 Vial Via Hhn Three Times A Day  Allergies (verified): 1)  ! Codeine 2)  ! Allopurinol 3)  ! Morphine 4)  ! Hydrocodone  Past History:  Past Medical History: Last updated:  01/10/2009 MACULAR DEGENERATION (ICD-362.50) SINUSITIS, CHRONIC (ICD-473.9) ASTHMATIC BRONCHITIS, ACUTE (ICD-466.0) Hx of PNEUMONIA (ICD-486) HYPERTENSION (ICD-401.9) AORTIC STENOSIS (ICD-424.1) Hx of CHF (ICD-428.0) PERIPHERAL VASCULAR DISEASE (ICD-443.9) VENOUS INSUFFICIENCY (ICD-459.81) HYPERLIPIDEMIA (ICD-272.4) HIATAL HERNIA (ICD-553.3) GERD (ICD-530.81) DIVERTICULOSIS, COLON (ICD-562.10) STRESS INCONTINENCE (ICD-788.39) DEGENERATIVE JOINT DISEASE (ICD-715.90) GOUT, UNSPECIFIED (ICD-274.9) LOW BACK PAIN (ICD-724.2) NECK PAIN (ICD-723.1) OSTEOPOROSIS (ICD-733.00) ANXIETY (ICD-300.00) ANEMIA (ICD-285.9)  Past Surgical History: Last updated: 01/10/2009 S/P lumbar laminectomy S/P bilat shoulder surgeries  Family History: Last updated: 02/19/2009 heart disease - brother rheumatism - mother stroke -  father HTN - mother, father, all 4 brothers, both sisters  Social History: Last updated: 02/19/2009 never smoked no alcohol 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 No Comments Medications reviewed with patient Daytime contact number verified with patient. Boone Master CNA  May 02, 2009 3:44 PM    Physical Exam  Additional Exam:  WD, Petite, sl overweight, 77 y/o WF in NAD... she is <5' tall, GENERAL:  Alert & oriented; pleasant & cooperative...elderly and frail.  HEENT:  [place]/AT, EACs-clear, TMs-wnl, NOSE-clear, THROAT-clear & wnl... known  macular degen per ophthal... NECK:  Supple w/ fairROM; no JVD; normal carotid impulses w/o bruits; no thyromegaly or nodules palpated; no lymphadenopathy. CHEST:  Decr BS bilat, Coarse BS no wheezing or rhonchi.  HEART:  Regular Rhythm; gr 1-2/6 SEM without rubs or gallops detected... ABDOMEN:  Soft & nontender; normal bowel sounds; no organomegaly or masses palpated... EXT: +hand deformities, w/ tophi & mod arthritic changes & contractures... right knee arthritis & severe foot deformities as well. no varicose veins/+venous insuffic/ tr edema. NEURO:  CN's intact x reduced vision... diffusely weak, without focal changes. DERM: along left distal/mid forearm skin tear w/ pink base w/ 3.5cm base. no red streaks.     Impression & Recommendations:  Problem # 1:  URINARY TRACT INFECTION, RECURRENT (ICD-599.0) She has fatigue symptoms, will make sure no underlying UTI causing symptoms  The following medications were removed from the medication list:    Amoxicillin-pot Clavulanate 875-125 Mg Tabs (Amoxicillin-pot clavulanate) .Marland Kitchen... 1 by mouth two times a day  Orders: T-Urine Culture (Spectrum Order) [id number]) TLB-Udip w/ Micro (81001-URINE) Est. Patient Level IV (09811)  Problem # 2:  OTH SYMPTOMS INVOLVING SKIN&INTEG TISSUES (ICD-782.9) Skin tear s/p contusion to arm 2/2 to fall. She has very thin skin.  Area was cleansed and dressing applied.  advised on skin and telfa non stick bandage.  check cbc  Complete Medication List: 1)  Xopenex 1.25 Mg/44ml Nebu (Levalbuterol hcl) .... Use via nebulizer three times a day 2)  Advair Diskus 250-50 Mcg/dose Misc (Fluticasone-salmeterol) .Marland Kitchen.. 1 inhalation two times a day 3)  Prednisone 5 Mg Tabs (Prednisone) .... Take 1 tablet by mouth once a day 4)  Verapamil Hcl Cr 240 Mg Tbcr (Verapamil hcl) .Marland Kitchen.. 1 tab by mouth once daily 5)  Torsemide 20 Mg Tabs (Torsemide) .Marland Kitchen.. 1 tab by mouth once daily 6)  Potassium Chloride Crys Cr 20 Meq Tbcr (Potassium  chloride crys cr) .Marland Kitchen.. 1 tab daily 7)  Omeprazole 20 Mg Cpdr (Omeprazole) .Marland Kitchen.. 1 tab daily taken 30 min before a meal. 8)  Reglan 10 Mg Tabs (Metoclopramide hcl) .... Take 1 tab by mouth at bedtime 9)  Miralax Powd (Polyethylene glycol 3350) .... Dissolve one capful in 8oz water daily 10)  Uloric 40 Mg Tabs (Febuxostat) .... Take 1 tab by mouth once daily... 11)  Fosamax 70 Mg Tabs (Alendronate sodium) .... Take one tablet by mouth once weekly 12)  Caltrate 600+d 600-400 Mg-unit Tabs (Calcium carbonate-vitamin d) .Marland Kitchen.. 1 tab by mouth two times a day for bones... 13)  Multivitamins Tabs (Multiple vitamin) .... Take 1 tablet by mouth once a day 14)  Vitamin D3 1000 Unit Caps (Cholecalciferol) .... Take 1 cap by mouth once daily... 15)  Amitriptyline Hcl 25 Mg Tabs (Amitriptyline hcl) .... Take 1 tab by mouth at bedtime 16)  Alprazolam 0.25 Mg Tabs (Alprazolam) .Marland Kitchen.. 1 by mouth once daily as needed nerves 17)  Neurontin 100 Mg Caps (Gabapentin) .... As directed by drlewitt 18)  Lotrisone 1-0.05 % Crea (Clotrimazole-betamethasone) .... Apply to rash two times a day 19)  Ipratropium Bromide 0.06 % Soln (Ipratropium bromide) .... Inhale 1 vial via hhn three times a day 20)  Albuterol Sulfate (2.5 Mg/74ml) 0.083% Nebu (Albuterol sulfate) .... Inhale 1 vial via hhn three times a day  Other Orders: TLB-CBC Platelet - w/Differential (85025-CBCD) TLB-BMP (Basic Metabolic Panel-BMET) (80048-METABOL) TLB-IBC Pnl (Iron/FE;Transferrin) (83550-IBC)  Patient Instructions: 1)  Continue with wound center as scheduled.  2)  Keep left arm elevated as much as possible.  3)  I will call with lab results.  4)  Please contact office for sooner follow up if symptoms do not improve or worsen  5)  follow up Dr. Kriste Basque as scheduled.

## 2010-03-04 NOTE — Assessment & Plan Note (Signed)
Summary: rov/apc   CC:  5 month ROV & review of mult medical problems....  History of Present Illness: 77 y/o WF here for a follow up visit... she has multiple medical problems as noted below...    ~  8/09:  s/p hosp 8/3-8/09 w/ T 104, bilat LL infiltrates, neg c/s, neg strep & legionella, neg H1N1, and improved w/ broad spectrum antibiotics, solumedrol, bronchodil, O2, etc... disch on Avelox + her usual max home regimen... she was improved- just "weak"... CXR cleared to baseline... she has [facility] w/ visiting nurses, PT, DME needs, etc...   ~  10/09:  reports doing better overall & [facility] finished their home therapy, etc... c/o 1 week hx incr cough, sm amt thick dark sputum, some congestion, denies f/c/s... denies CP, incr SOB, edema, etc... treated w/ Avelox but only sl better...  ~  seen by TP several times in Oct/ Nov w/ persist symptoms- given ZPak, Pred, Mucinex, etc... further eval w/ CXR showing stable cardiomeg, NAD;  CTAngio Chest= neg for PE, +for fluid/ retained secretions in LLobe bronchi;  SinusCT=  chr max sinus opac;  Labs= OK...  ~  12/09:  finally improved and back to baseline... we had a frank discussion about her condition and the need to continue vigorous home Rx to prevent mucous plugging etc... she received both Flu shots in 2009.   ~  Apr10:  states she's doing "pretty good" but c/o headache and neck pain- saw DrNitka for neck pain w/ MRI ordered... then sent to DrLewitt for eval and he Rx w/ Lidoderm patches, tried muscle relaxers & ES Tylenol...  breathing has been OK, at baseline...  ~  Sept10:  she has been stable- doing satis, but her 48 y/o son had MI, stent- stressful for her... DrLewitt has placed her on Lyrica and improved...   ~  Oct10:  Hosp 10/12-15 by [facility] w/ left foot cellulitis and UTI- EChart records reviewed & one neg bl cult- no wound cult, no wound apparent, no urine cult... treated w/ Clindamycin & improved... left foot now back to baseline- no wound, not red, not  tender, etc... she has severe DJD & Gout w/ deformities- Foot XRay showed Charcot foot deformity, severe degenerative dis, etc... MRI did not show any osteomyelitis... treated w/ Clindamycin and improved... foot now back to baseline & she is back on her prev med Rx regimen... CC= urinary symptoms & we discussed UA & C/S...  ~  Dec10:  she fell at home 3d ago (ocurred after rising from seated position) & went to the ER- no serious injury, no signs of stroke etc, labs OK... she has known AS, HBP on meds & well controlled w/o postural changes... otherw stable over the last 6wks... continue same meds.   ~  Jun 27, 2009:  she saw TP 3/11 w/ skin tear left forearm from near fall- treated in wound care clinic & improved... labs showed Hg=12.5 but Fe=22 therefore started on FeSO4 Bid but she still feels as though her iron is low, she says> discussed checking labs again today... otherw stable- breathing is OK using nebs, Advair, Pred... BP controlled on meds; no ch in dizziness, denies syncope etc;  severe arthritic changes and able to make it at home w/ help of her son...    Current Problems:   MACULAR DEGENERATION (ICD-362.50)  SINUSITIS, CHRONIC (ICD-473.9) - she is s/p bliat [facility] 1986 by DrKraus...  ~  CT Sinus 11/09 showed chr sinusitis changes in the max &  sphenoid regions...  ASTHMATIC BRONCHITIS, ACUTE (ICD-466.0) - on PRED 5mg /d, ADVAIR 250Bid, & XOPENEX NEBS Tid... stable- min cough; w/o sputum, change in DOE, etc...  ~  PFT's 3/06 showed FVC= 1.49 (68%), FEV1= 1.12 (75%), %1sec=75, mid-flows= 43%pred...  ~  [facility] 8/09 w/ bilat pneumonia... see DC Summary.  ~  CXR 10/09 showed stable cardiomeg, tort Ao, clear lungs, bilat shoulder arthroplasties...  ~  CT Angio Chest 11/09 showed neg for PE, fluid/ mucus plugs in depend LL bronchi...  ~  CXR 10/10 showed stable cardiomeg, bilat humeral head prostheses, NAD...  HYPERTENSION (ICD-401.9) - on VERAPAMIL 240mg /d, DEMEDEX 20mg /d &  K20/d... she was prev on Avapro but it was too $$$ and she stopped it- BP's have been OK off  this med... her BP's have stabilized at home and measures 118/60 here today... she denies CP, palpit, syncope, edema...  AORTIC STENOSIS (ICD-424.1) & Hx of CHF (ICD-428.0)- weight = 141#, stable... VI w/ chr edema 1+...  ~  2DEcho 3/06 showed mild AS/AI w/ mod incr AoV thickness & reduced leaflet excursion, mild MR, norm EF=55-65%...   ~  2DEcho 12/08 showed mod inc AoV thickness, mod reduced AV leaflet excursion, c/w mod AS, mild AI & mean grad= ;  norm LVF w/ EF= 65% and no wall motion abn etc...  PERIPHERAL VASCULAR DISEASE (ICD-443.9) - on ASA 81mg /d... she can't walk enough to elicit discomfort, no rest pain or lesions...  ~  ABI's 12/01 showed .88 on R, and .95 on L...  ~  MRI showed small vessel dis & small infarct noted...  VENOUS INSUFFICIENCY (ICD-459.81) - on low sodium diet, Demedex, KCl...  HYPERLIPIDEMIA (ICD-272.4) - on diet alone... prev on Pravachol, but intol to other Statins... takes Fish Oil  ~2000mg /d rec by her ophthalmologist...  ~  FLP 9/05 ?on Prav40 showed TChol 176, TG 190, HDL 55, LDL 83...  ~  FLP 4/10 showed TChol 211, TG 153, HDL 44, LDL 128... she refuses med Rx- continue diet efforts.  ~  it's hard to get her in for fasting labs...  HIATAL HERNIA (ICD-553.3) - on PRILOSEC 20mg /d & METACLOPRAMIDE 10mg Qhs... GERD (ICD-530.81) - EGD 3/06 by DrStark showed 3cmHH, mild esophagitis...  DIVERTICULOSIS, COLON (ICD-562.10) - last colonoscopy was 6/06 by [facility] showed divertics and ischemic colitis...   STRESS INCONTINENCE (ICD-788.39) - she has hx UTI's on & off...  ~  10/10: urine c/s +EColi & Rx'd w/ Cipro...  DEGENERATIVE JOINT DISEASE (ICD-715.90) - takes IBUPROFEN + TYLENOL Prn... GOUT, UNSPECIFIED (ICD-274.9) - labs 4/10 showed Uric Acid level= 7.9.Marland KitchenMarland Kitchen she has hx of gout & hyperuricemia but allergic to Allopurinol w/ rash... therefore on ULORIC 40mg /d... Uric Acid  level 5/11 on Uloric40 =  LOW BACK PAIN (ICD-724.2) NECK PAIN (ICD-723.1) - eval DrLewitt w/ MRI & prev Rx Lyrica- now on NEURONTIN... OSTEOPOROSIS (ICD-733.00) - on FOSAMAX, Ca++, Vits, & Vit D...  ~  labs 4/10 showed Vit D level = 28... rec> start Vit D OTC 1000 u daily.  ~  labs 5/11 showed Vit D level = 32... rec incr Vit D to 2000 u daily.  ANXIETY (ICD-300.00)  Hx of ANEMIA (ICD-285.9) - hx anemia req 2U transfusion 6/06 hosp for ischemic colitis...   ~  Hg= 12 - 13 over 2008-9...  ~  labs 8/09 hosp showed Hg= 14  ~  labs 4/10 showed Hg= 12.2  ~  labs 3/11 showed Hg= 12.5, MCV= 92, Fe= 22 (9%sat)... start FeSO4 Bid.  ~  labs 5/11 showed =  13.0, MCV= 94, Fe= 57 (23%)... continue Fe supplement...   Allergies: 1)  ! Codeine 2)  ! Allopurinol 3)  ! Morphine 4)  ! Hydrocodone  Comments:  Nurse/Medical Assistant: The patient's medications and allergies were reviewed with the patient and were updated in the Medication and Allergy Lists.  Past History:  Past Medical History: MACULAR DEGENERATION (ICD-362.50) SINUSITIS, CHRONIC (ICD-473.9) ASTHMATIC BRONCHITIS, ACUTE (ICD-466.0) Hx of PNEUMONIA (ICD-486) HYPERTENSION (ICD-401.9) AORTIC STENOSIS (ICD-424.1) Hx of CHF (ICD-428.0) PERIPHERAL VASCULAR DISEASE (ICD-443.9) VENOUS INSUFFICIENCY (ICD-459.81) HYPERLIPIDEMIA (ICD-272.4) HIATAL HERNIA (ICD-553.3) GERD (ICD-530.81) DIVERTICULOSIS, COLON (ICD-562.10) STRESS INCONTINENCE (ICD-788.39) DEGENERATIVE JOINT DISEASE (ICD-715.90) GOUT, UNSPECIFIED (ICD-274.9) LOW BACK PAIN (ICD-724.2) NECK PAIN (ICD-723.1) OSTEOPOROSIS (ICD-733.00) ANXIETY (ICD-300.00) ANEMIA (ICD-285.9)  Past Surgical History: S/P lumbar laminectomy S/P bilat shoulder surgeries  Family History: Reviewed history from 02/19/2009 and no changes required. heart disease - brother rheumatism - mother stroke -  father HTN - mother, father, all 4 brothers, both sisters  Social History: Reviewed  history from 02/19/2009 and no changes required. never smoked no alcohol 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  Review of Systems      See HPI       The patient complains of decreased hearing, dyspnea on exertion, peripheral edema, and difficulty walking.  The patient denies anorexia, fever, weight loss, weight gain, vision loss, hoarseness, chest pain, syncope, prolonged cough, headaches, hemoptysis, abdominal pain, melena, hematochezia, severe indigestion/heartburn, hematuria, incontinence, muscle weakness, suspicious skin lesions, transient blindness, depression, unusual weight change, abnormal bleeding, enlarged lymph nodes, and angioedema.    Vital Signs:  Patient profile:   77 year old female Height:      59 inches Weight:      141 pounds BMI:     28.58 O2 Sat:      98 % on Room air Temp:     97.9 degrees F oral Pulse rate:   74 / minute BP sitting:   118 / 60  (right arm) Cuff size:   regular  Vitals Entered By: Randell Loop CMA (Jun 27, 2009 11:56 AM)  O2 Sat at Rest %:  98 O2 Flow:  Room air CC: 5 month ROV & review of mult medical problems... Is Patient Diabetic? No Pain Assessment Patient in pain? no      Comments no changes in meds today   Physical Exam  Additional Exam:  WD, Petite, sl overweight, 77 y/o WF in NAD... she is <5' tall, weight 141# GENERAL:  Alert & oriented; pleasant & cooperative... HEENT:  Tubac/AT, EACs-clear, TMs-wnl, NOSE-clear, THROAT-clear & wnl... known macular degen per ophthal... NECK:  Supple w/ fairROM; no JVD; normal carotid impulses w/o bruits; no thyromegaly or nodules palpated; no lymphadenopathy. CHEST:  Decr BS bilat, Coarse BS, no wheezing or rhonchi... HEART:  Regular Rhythm;  gr 1-2/6 SEM without rubs or gallops detected... ABDOMEN:  Soft & nontender; normal bowel sounds; no organomegaly or masses palpated... EXT: +hand deformities, w/ tophi & mod arthritic changes & contractures... right knee arthritis & severe foot  deformities as well. no varicose veins/+venous insuffic/ tr edema. NEURO:  CN's intact x reduced vision... diffusely weak, without focal changes... +gait abn... DERM:  no lesions- ecchymoses, thin skin, etc...    Impression & Recommendations:  Problem # 1:  ASTHMATIC BRONCHITIS, ACUTE (ICD-466.0) Stable on NEBS, Advair, Pred, etc... The following medications were removed from the medication list:    Xopenex 1.25 Mg/95ml Nebu (Levalbuterol hcl) ..... Use via nebulizer three times a day Her updated medication list for  this problem includes:    Albuterol Sulfate (2.5 Mg/12ml) 0.083% Nebu (Albuterol sulfate) ..... Inhale 1 vial via hhn three times a day    Advair Diskus 250-50 Mcg/dose Misc (Fluticasone-salmeterol) .Marland Kitchen... 1 inhalation two times a day  Problem # 2:  HYPERTENSION (ICD-401.9) Controlled>  same meds... Her updated medication list for this problem includes:    Verapamil Hcl Cr 240 Mg Tbcr (Verapamil hcl) .Marland Kitchen... 1 tab by mouth once daily    Torsemide 20 Mg Tabs (Torsemide) .Marland Kitchen... 1 tab by mouth once daily  Orders: TLB-BMP (Basic Metabolic Panel-BMET) (80048-METABOL) TLB-Hepatic/Liver Function Pnl (80076-HEPATIC) TLB-CBC Platelet - w/Differential (85025-CBCD) TLB-TSH (Thyroid Stimulating Hormone) (84443-TSH) TLB-BNP (B-Natriuretic Peptide) (83880-BNPR) TLB-Uric Acid, Blood (84550-URIC) TLB-IBC Pnl (Iron/FE;Transferrin) (83550-IBC) T-Vitamin D (25-Hydroxy) ([address]109-60454)  Problem # 3:  AORTIC STENOSIS (ICD-424.1) Stable> no change on exam & no progressive symptoms...  Problem # 4:  PERIPHERAL VASCULAR DISEASE (ICD-443.9) Continue ASA daily...  Problem # 5:  HYPERLIPIDEMIA (ICD-272.4) She is INTOL to statins on diet + FishOil...   Problem # 6:  GERD (ICD-530.81) GI is stable on meds... Her updated medication list for this problem includes:    Omeprazole 20 Mg Cpdr (Omeprazole) .Marland Kitchen... 1 tab daily taken 30 min before a meal.  Problem # 7:  DEGENERATIVE JOINT DISEASE  (ICD-715.90) She has severe DJD, Gout, LBP, Neck Pain, etc... on Uloric check Uric;  uses OTC NSAIDs & Tylenol Prn...  Problem # 8:  OSTEOPOROSIS (ICD-733.00) Continue Fosamax, calcium, MVI, Vit d etc... increase Vit D to 2000. Her updated medication list for this problem includes:    Fosamax 70 Mg Tabs (Alendronate sodium) .Marland Kitchen... Take one tablet by mouth once weekly  Problem # 9:  ANEMIA (ICD-285.9) Still feels bad>  check of CBC & Fe are improved> continue same. Her updated medication list for this problem includes:    Ferrous Sulfate 325 (65 Fe) Mg Tabs (Ferrous sulfate) .Marland Kitchen... Take 1 tablet by mouth two times a day  Problem # 10:  OTHER MEDICAL PROBLEMS AS NOTED>>>  Complete Medication List: 1)  Albuterol Sulfate (2.5 Mg/52ml) 0.083% Nebu (Albuterol sulfate) .... Inhale 1 vial via hhn three times a day 2)  Ipratropium Bromide 0.06 % Soln (Ipratropium bromide) .... Inhale 1 vial via hhn three times a day 3)  Advair Diskus 250-50 Mcg/dose Misc (Fluticasone-salmeterol) .Marland Kitchen.. 1 inhalation two times a day 4)  Prednisone 5 Mg Tabs (Prednisone) .... Take 1 tablet by mouth once a day 5)  Verapamil Hcl Cr 240 Mg Tbcr (Verapamil hcl) .Marland Kitchen.. 1 tab by mouth once daily 6)  Torsemide 20 Mg Tabs (Torsemide) .Marland Kitchen.. 1 tab by mouth once daily 7)  Potassium Chloride Crys Cr 20 Meq Tbcr (Potassium chloride crys cr) .Marland Kitchen.. 1 tab daily 8)  Omeprazole 20 Mg Cpdr (Omeprazole) .Marland Kitchen.. 1 tab daily taken 30 min before a meal. 9)  Reglan 10 Mg Tabs (Metoclopramide hcl) .... Take 1 tab by mouth at bedtime 10)  Miralax Powd (Polyethylene glycol 3350) .... Dissolve one capful in 8oz water daily 11)  Uloric 40 Mg Tabs (Febuxostat) .... Take 1 tab by mouth once daily... 12)  Fosamax 70 Mg Tabs (Alendronate sodium) .... Take one tablet by mouth once weekly 13)  Caltrate 600+d 600-400 Mg-unit Tabs (Calcium carbonate-vitamin d) .Marland Kitchen.. 1 tab by mouth two times a day for bones... 14)  Multivitamins Tabs (Multiple vitamin) .... Take 1  tablet by mouth once a day 15)  Vitamin D3 1000 Unit Caps (Cholecalciferol) .... Take 1 cap by mouth once  daily... 16)  Neurontin 100 Mg Caps (Gabapentin) .... As directed by drlewitt 17)  Amitriptyline Hcl 25 Mg Tabs (Amitriptyline hcl) .... Take 1 tab by mouth at bedtime 18)  Alprazolam 0.25 Mg Tabs (Alprazolam) .Marland Kitchen.. 1 by mouth once daily as needed nerves 19)  Lotrisone 1-0.05 % Crea (Clotrimazole-betamethasone) .... Apply to rash two times a day 20)  Ferrous Sulfate 325 (65 Fe) Mg Tabs (Ferrous sulfate) .... Take 1 tablet by mouth two times a day  Patient Instructions: 1)  Today we updated your med list- see below.... 2)  Continue your current medications the same for now... 3)  Today we did your follow up non-fasting blood work... please call the "phone tree" in a few days for your lab results.Marland KitchenMarland Kitchen 4)  Call for any problems.Marland KitchenMarland Kitchen 5)  Please schedule a follow-up appointment in 4 months.

## 2010-03-04 NOTE — Assessment & Plan Note (Signed)
Summary: 3 month return/mhh   CC:  3-4 month ROV & review of mult medical problems....  History of Present Illness: 77 y/o WF here for a follow up visit... she has multiple medical problems as noted below...    ~  8/09:  s/p hosp 8/3-8/09 w/ T 104, bilat LL infiltrates, neg c/s, neg strep & legionella, neg H1N1, and improved w/ broad spectrum antibiotics, solumedrol, bronchodil, O2, etc... disch on Avelox + her usual max home regimen... she was improved- just "weak"... CXR cleared to baseline... she has [facility] w/ visiting nurses, PT, DME needs, etc...   ~  10/09:  reports doing better overall & [facility] finished their home therapy, etc... c/o 1 week hx incr cough, sm amt thick dark sputum, some congestion, denies f/c/s... denies CP, incr SOB, edema, etc... treated w/ Avelox but only sl better...  ~  seen by TP several times in Oct/ Nov w/ persist symptoms- given ZPak, Pred, Mucinex, etc... further eval w/ CXR showing stable cardiomeg, NAD;  CTAngio Chest= neg for PE, +for fluid/ retained secretions in LLobe bronchi;  SinusCT=  chr max sinus opac;  Labs= OK...  ~  12/09:  finally improved and back to baseline... we had a frank discussion about her condition and the need to continue vigorous home Rx to prevent mucous plugging etc... she received both Flu shots in 2009.   ~  Apr10:  states she's doing "pretty good" but c/o headache and neck pain- saw DrNitka for neck pain w/ MRI ordered... then sent to DrLewitt for eval and he Rx w/ Lidoderm patches, tried muscle relaxers & ES Tylenol...  breathing has been OK, at baseline...  ~  Sept10:  she has been stable- doing satis, but her 64 y/o son had MI, stent- stressful for her... DrLewitt has placed her on Lyrica and improved...   ~  Oct10:  Hosp 10/12-15 by [facility] w/ left foot cellulitis and UTI- EChart records reviewed & one neg bl cult- no wound cult, no wound apparent, no urine cult... treated w/ Clindamycin & improved... left foot now back to baseline- no wound,  not red, not tender, etc... she has severe DJD & Gout w/ deformities- Foot XRay showed Charcot foot deformity, severe degenerative dis, etc... MRI did not show any osteomyelitis... treated w/ Clindamycin and improved... foot now back to baseline & she is back on her prev med Rx regimen... CC= urinary symptoms & we discussed UA & C/S...  ~  Dec10:  she fell at home 3d ago (ocurred after rising from seated position) & went to the ER- no serious injury, no signs of stroke etc, labs OK... she has known AS, HBP on meds & well controlled w/o postural changes... otherw stable over the last 6wks... continue same meds.   ~  Jun 27, 2009:  she saw TP 3/11 w/ skin tear left forearm from near fall- treated in wound care clinic & improved... labs showed Hg=12.5 but Fe=22 therefore started on FeSO4 Bid but she still feels as though her iron is low, she says> discussed checking labs again today (Hg=13, Fe=57, sat=23%)... otherw stable- breathing is OK using nebs, Advair, Pred... BP controlled on meds; no ch in dizziness, denies syncope etc;  severe arthritic changes and able to make it at home w/ help of her son...   ~  October 16, 2009:  she has severe DJD/ Gout on Uloric40 w/ Uric=3.2 May11... c/o bilat leg pain & known lumbar disc dis followed by Susann Givens & needs f/u  appt & reassessment (prev shots in back neck knees w/o much help)... she doesn't want Codeine/ Vicodin so we will Rx w/ Tramadol/ Tylenol in the interim... otherw stable & managing at home w/ son's help- breathing stable w/o exac;  BP controlled on meds;  OK Flu shot today.   Current Problems:   MACULAR DEGENERATION (ICD-362.50)  SINUSITIS, CHRONIC (ICD-473.9) - she is s/p bliat [facility] 1986 by DrKraus...  ~  CT Sinus 11/09 showed chr sinusitis changes in the max & sphenoid regions...  ASTHMATIC BRONCHITIS, ACUTE (ICD-466.0) - on PRED 5mg /d, ADVAIR 250Bid, & XOPENEX NEBS Tid... stable- min cough; w/o sputum, change in DOE, etc...  ~   PFT's 3/06 showed FVC= 1.49 (68%), FEV1= 1.12 (75%), %1sec=75, mid-flows= 43%pred...  ~  [facility] 8/09 w/ bilat pneumonia... see DC Summary.  ~  CXR 10/09 showed stable cardiomeg, tort Ao, clear lungs, bilat shoulder arthroplasties...  ~  CT Angio Chest 11/09 showed neg for PE, fluid/ mucus plugs in depend LL bronchi...  ~  CXR 10/10 showed stable cardiomeg, bilat humeral head prostheses, NAD...  HYPERTENSION (ICD-401.9) - on VERAPAMIL 240mg /d, DEMEDEX 20mg /d & K20/d... she was prev on Avapro but it was too $$$ and she stopped it- BP's have been OK off  this med... her BP's have stabilized at home and measures 118/60 here today... she denies CP, palpit, syncope, edema...  AORTIC STENOSIS (ICD-424.1) & Hx of CHF (ICD-428.0)- weight = 141#, stable... VI w/ chr edema 1+...  ~  2DEcho 3/06 showed mild AS/AI w/ mod incr AoV thickness & reduced leaflet excursion, mild MR, norm EF=55-65%...   ~  2DEcho 12/08 showed mod inc AoV thickness, mod reduced AV leaflet excursion, c/w mod AS, mild AI & mean grad= ;  norm LVF w/ EF= 65% and no wall motion abn etc...  PERIPHERAL VASCULAR DISEASE (ICD-443.9) - on ASA 81mg /d... she can't walk enough to elicit discomfort, no rest pain or lesions...  ~  ABI's 12/01 showed .88 on R, and .95 on L...  ~  MRI showed small vessel dis & small infarct noted...  VENOUS INSUFFICIENCY (ICD-459.81) - on low sodium diet, Demedex, KCl...  HYPERLIPIDEMIA (ICD-272.4) - on diet alone... prev on Pravachol, but intol to other Statins... takes Fish Oil  ~2000mg /d rec by her ophthalmologist...  ~  FLP 9/05 ?on Prav40 showed TChol 176, TG 190, HDL 55, LDL 83...  ~  FLP 4/10 showed TChol 211, TG 153, HDL 44, LDL 128... she refuses med Rx- continue diet efforts.  ~  it's hard to get her in for fasting labs...  HIATAL HERNIA (ICD-553.3) - on PRILOSEC 20mg /d & METACLOPRAMIDE 10mg Qhs... GERD (ICD-530.81) - EGD 3/06 by DrStark showed 3cmHH, mild esophagitis...  DIVERTICULOSIS, COLON  (ICD-562.10) - last colonoscopy was 6/06 by [facility] showed divertics and ischemic colitis...   STRESS INCONTINENCE (ICD-788.39) - she has hx UTI's on & off...  ~  10/10: urine c/s +EColi & Rx'd w/ Cipro...  DEGENERATIVE JOINT DISEASE (ICD-715.90) - added TRAMADOL + TYLENOL Prn... GOUT, UNSPECIFIED (ICD-274.9) - labs 4/10 showed Uric Acid level= 7.9.Marland KitchenMarland Kitchen she has hx of gout & hyperuricemia but allergic to Allopurinol w/ rash... therefore on ULORIC 40mg /d... Uric Acid level 5/11 on Uloric40 =  LOW BACK PAIN (ICD-724.2) NECK PAIN (ICD-723.1) - eval DrLewitt w/ MRI & prev Rx Lyrica- now on NEURONTIN... OSTEOPOROSIS (ICD-733.00) - on FOSAMAX, Ca++, Vits, & Vit D...  ~  labs 4/10 showed Vit D level = 28... rec> start Vit D OTC 1000 u daily.  ~  labs 5/11 showed Vit D level = 32... rec incr Vit D to 2000 u daily.  ANXIETY (ICD-300.00)  Hx of ANEMIA (ICD-285.9) - hx anemia req 2U transfusion 6/06 hosp for ischemic colitis...   ~  Hg= 12 - 13 over 2008-9...  ~  labs 8/09 hosp showed Hg= 14  ~  labs 4/10 showed Hg= 12.2  ~  labs 3/11 showed Hg= 12.5, MCV= 92, Fe= 22 (9%sat)... start FeSO4 Bid.  ~  labs 5/11 showed = 13.0, MCV= 94, Fe= 57 (23%)... continue Fe supplement...   Preventive Screening-Counseling & Management  Alcohol-Tobacco     Smoking Status: never  Allergies: 1)  ! Codeine 2)  ! Allopurinol 3)  ! Morphine 4)  ! Hydrocodone  Comments:  Nurse/Medical Assistant: The patient's medications and allergies were reviewed with the patient and were updated in the Medication and Allergy Lists.  Past History:  Past Medical History: MACULAR DEGENERATION (ICD-362.50) SINUSITIS, CHRONIC (ICD-473.9) ASTHMATIC BRONCHITIS, ACUTE (ICD-466.0) Hx of PNEUMONIA (ICD-486) HYPERTENSION (ICD-401.9) AORTIC STENOSIS (ICD-424.1) Hx of CHF (ICD-428.0) PERIPHERAL VASCULAR DISEASE (ICD-443.9) VENOUS INSUFFICIENCY (ICD-459.81) HYPERLIPIDEMIA (ICD-272.4) HIATAL HERNIA (ICD-553.3) GERD  (ICD-530.81) DIVERTICULOSIS, COLON (ICD-562.10) STRESS INCONTINENCE (ICD-788.39) DEGENERATIVE JOINT DISEASE (ICD-715.90) GOUT, UNSPECIFIED (ICD-274.9) LOW BACK PAIN (ICD-724.2) NECK PAIN (ICD-723.1) OSTEOPOROSIS (ICD-733.00) ANXIETY (ICD-300.00) ANEMIA (ICD-285.9)  Past Surgical History: S/P lumbar laminectomy S/P bilat shoulder surgeries  Family History: Reviewed history from 02/19/2009 and no changes required. heart disease - brother rheumatism - mother stroke -  father HTN - mother, father, all 4 brothers, both sisters  Social History: Reviewed history from 02/19/2009 and no changes required. 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  The patient denies anorexia, fever, weight loss, weight gain, vision loss, hoarseness, chest pain, syncope, peripheral edema, prolonged cough, headaches, hemoptysis, abdominal pain, melena, hematochezia, severe indigestion/heartburn, hematuria, incontinence, suspicious skin lesions, transient blindness, depression, unusual weight change, abnormal bleeding, enlarged lymph nodes, and angioedema.    Vital Signs:  Patient profile:   77 year old female Height:      59 inches Weight:      139 pounds BMI:     28.18 O2 Sat:      7 % on room air Temp:     98.5 degrees F oral Pulse rate:   57 / minute BP sitting:   150 / 78  (right arm) Cuff size:   regular  Vitals Entered By: Randell Loop CMA (October 16, 2009 2:39 PM)  O2 Sat at Rest %:  98 O2 Flow:  room air CC: 3-4 month ROV & review of mult medical problems... Is Patient Diabetic? No Pain Assessment Patient in pain? yes      Onset of pain  her legs are bothering her Comments NO CHANGES IN MEDS TODAY   Physical Exam  Additional Exam:  WD, Petite, sl overweight, 77 y/o WF in NAD... she is <5' tall, weight  139# GENERAL:  Alert & oriented; pleasant & cooperative... HEENT:  [place]/AT, EACs-clear, TMs-wnl, NOSE-clear, THROAT-clear & wnl... known macular degen per ophthal... NECK:  Supple w/ fairROM; no JVD; normal carotid impulses w/o bruits; no thyromegaly or nodules palpated; no lymphadenopathy. CHEST:  Decr BS bilat, Coarse BS, no wheezing or rhonchi... HEART:  Regular Rhythm;  gr 1-2/6 SEM without rubs or gallops detected... ABDOMEN:  Soft & nontender;  normal bowel sounds; no organomegaly or masses palpated... EXT: +hand deformities, w/ tophi & mod arthritic changes & contractures... right knee arthritis & severe foot deformities as well. no varicose veins/+venous insuffic/ tr edema. NEURO:  CN's intact x reduced vision... diffusely weak, without focal changes... +gait abn... DERM:  no lesions- ecchymoses, thin skin, etc...    Impression & Recommendations:  Problem # 1:  ASTHMATIC BRONCHITIS, ACUTE (ICD-466.0) Stable on Rx>  w/o exac recently... Her updated medication list for this problem includes:    Albuterol Sulfate (2.5 Mg/45ml) 0.083% Nebu (Albuterol sulfate) ..... Inhale 1 vial via hhn three times a day    Advair Diskus 250-50 Mcg/dose Misc (Fluticasone-salmeterol) .Marland Kitchen... 1 inhalation two times a day  Problem # 2:  HYPERTENSION (ICD-401.9) Controlled>  same meds. Her updated medication list for this problem includes:    Verapamil Hcl Cr 240 Mg Tbcr (Verapamil hcl) .Marland Kitchen... 1 tab by mouth once daily    Torsemide 20 Mg Tabs (Torsemide) .Marland Kitchen... 1 tab by mouth once daily  Problem # 3:  AORTIC STENOSIS (ICD-424.1) Stable>  no ch in murmur, last 2DEcho 2008, not actively followed by Cards due to age & co-morbidities...  Problem # 4:  PERIPHERAL VASCULAR DISEASE (ICD-443.9) Stable>  continue ASA daily, not able to exercise sufficiently.  Problem # 5:  HYPERLIPIDEMIA (ICD-272.4) On diet alone as she has refused med Rx...  Problem # 6:  GERD (ICD-530.81) GI is stable on med Rx... Her updated  medication list for this problem includes:    Omeprazole 20 Mg Cpdr (Omeprazole) .Marland Kitchen... 1 tab daily taken 30 min before a meal.  Problem # 7:  DEGENERATIVE JOINT DISEASE (ICD-715.90) She has severe DJD, Gout, LBP, etc... we will Rx w/ TRAMADFOL + Tylenol for comfort & refer back to DrNitka to see if ther is anything that he can do... Her updated medication list for this problem includes:    Tramadol Hcl 50 Mg Tabs (Tramadol hcl) .Marland Kitchen... Take 1 tab by mouth every 6 h as needed for pain (may take w/ tylenol)...  Orders: Orthopedic Referral (Ortho)  Problem # 8:  OTHER MEDICAL PROBLEMS AS NOTED>>>  Complete Medication List: 1)  Albuterol Sulfate (2.5 Mg/54ml) 0.083% Nebu (Albuterol sulfate) .... Inhale 1 vial via hhn three times a day 2)  Ipratropium Bromide 0.06 % Soln (Ipratropium bromide) .... Inhale 1 vial via hhn three times a day 3)  Advair Diskus 250-50 Mcg/dose Misc (Fluticasone-salmeterol) .Marland Kitchen.. 1 inhalation two times a day 4)  Prednisone 5 Mg Tabs (Prednisone) .... Take 1 tablet by mouth once a day 5)  Verapamil Hcl Cr 240 Mg Tbcr (Verapamil hcl) .Marland Kitchen.. 1 tab by mouth once daily 6)  Torsemide 20 Mg Tabs (Torsemide) .Marland Kitchen.. 1 tab by mouth once daily 7)  Potassium Chloride Crys Cr 20 Meq Tbcr (Potassium chloride crys cr) .Marland Kitchen.. 1 tab daily 8)  Omeprazole 20 Mg Cpdr (Omeprazole) .Marland Kitchen.. 1 tab daily taken 30 min before a meal. 9)  Reglan 10 Mg Tabs (Metoclopramide hcl) .... Take 1 tab by mouth at bedtime 10)  Miralax Powd (Polyethylene glycol 3350) .... Dissolve one capful in 8oz water daily 11)  Uloric 40 Mg Tabs (Febuxostat) .... Take 1 tab by mouth once daily... 12)  Fosamax 70 Mg Tabs (Alendronate sodium) .... Take one tablet by mouth once weekly 13)  Caltrate 600+d 600-400 Mg-unit Tabs (Calcium carbonate-vitamin d) .Marland Kitchen.. 1 tab by mouth two times a day for bones... 14)  Multivitamins Tabs (Multiple vitamin) .... Take 1 tablet by  mouth once a day 15)  Vitamin D3 1000 Unit Caps (Cholecalciferol)  .... Take 1 cap by mouth once daily... 16)  Neurontin 100 Mg Caps (Gabapentin) .... As directed by drlewitt 17)  Amitriptyline Hcl 25 Mg Tabs (Amitriptyline hcl) .... Take 1 tab by mouth at bedtime 18)  Alprazolam 0.25 Mg Tabs (Alprazolam) .Marland Kitchen.. 1 by mouth once daily as needed nerves 19)  Lotrisone 1-0.05 % Crea (Clotrimazole-betamethasone) .... Apply to rash two times a day 20)  Ferrous Sulfate 325 (65 Fe) Mg Tabs (Ferrous sulfate) .... Take 1 tablet by mouth two times a day 21)  Tramadol Hcl 50 Mg Tabs (Tramadol hcl) .... Take 1 tab by mouth every 6 h as needed for pain (may take w/ tylenol)...  Other Orders: Flu Vaccine 85yrs + MEDICARE PATIENTS (G9562) Administration Flu vaccine - MCR (Z3086)  Patient Instructions: 1)  Today we updated your med list- see below.... 2)  Continue your current meds the same... 3)  We wrote a new perscription for TRAMADOL to take w/ TYLENOL to your leg pain.Marland KitchenMarland Kitchen 4)  We will arrange for a consultation w/ DrNitka... 5)  Today we gave you the 2011 Flu vaccine... 6)  Call for any questions.Marland KitchenMarland Kitchen 7)  Please schedule a follow-up appointment in 4 months, w/ FASTING blood work at that time... Prescriptions: TRAMADOL HCL 50 MG TABS (TRAMADOL HCL) take 1 tab by mouth every 6 H as needed for pain (may take w/ Tylenol)...  #100 x 5   Entered and Authorized by:   Michele Mcalpine MD   Signed by:   Michele Mcalpine MD on 10/16/2009   Method used:   Print then Give to Patient   RxID:   [id number]     Flu Vaccine Consent Questions     Do you have a history of severe allergic reactions to this vaccine? no    Any prior history of allergic reactions to egg and/or gelatin? no    Do you have a sensitivity to the preservative Thimersol? no    Do you have a past history of Guillan-Barre Syndrome? no    Do you currently have an acute febrile illness? no    Have you ever had a severe reaction to latex? no    Vaccine information given and explained to patient? yes    Are you  currently pregnant? no    Lot Number:[id number]   Exp Date:08/02/2010   Site Given  Right Deltoid IMflu   Randell Loop CMA  October 16, 2009 3:13 PM

## 2010-03-04 NOTE — Letter (Signed)
[facility]: [facility]   Imported By: Sherian Rein 02/13/2009 14:45:04  _____________________________________________________________________  External Attachment:    Type:   Image     Comment:   External Document

## 2010-03-04 NOTE — Progress Notes (Signed)
Summary: refill lotrisone cream  Phone Note Call from Patient   Caller: Patient Call For: nadel Summary of Call: need refill for clotrinazole beta methasone cream [place] Initial call taken by: Rickard Patience,  December 13, 2009 9:38 AM  Follow-up for Phone Call        refill sent. pt aware.Carron Curie CMA  December 13, 2009 10:19 AM     Prescriptions: LOTRISONE 1-0.05 % CREA (CLOTRIMAZOLE-BETAMETHASONE) apply to rash two times a day  #1 tube x 4   Entered by:   Carron Curie CMA   Authorized by:   Michele Mcalpine MD   Signed by:   Carron Curie CMA on 12/13/2009   Method used:   Electronically to        [facility]* (retail)       [address]       [place], [place]  [id number]       Ph: [phone]       Fax: [phone]   RxID:   [id number]

## 2010-03-04 NOTE — Progress Notes (Signed)
Summary: leg pain- ATC x 2 -line busy  Phone Note Call from Patient Call back at [facility] Phone [phone]   Caller: Patient Call For: nadel Summary of Call: pt c/o leg pain. says she told dr Kriste Basque about this at her last visit. pt says she has been taking vitamin D since she was told to. wants to know if she should increase vitamin D or what are recs for this (says it's both legs upper and lower) Initial call taken by: Tivis Ringer, CNA,  August 19, 2009 9:28 AM  Follow-up for Phone Call        called and spoke with pt and she stated that since her last ov with SN on 06/2009   she has c/o of both legs hurting--aching more as the day goes on---she has been taking her vit d 1000 daily but yesterday she took 2 and stated that this helped some.  should she cont with the 2000 daily of the vit d--or any recs for this??  please advise. thanks Randell Loop CMA  August 19, 2009 9:42 AM   Additional Follow-up for Phone Call Additional follow up Details #1::        it is fine to take 2000 units of vit d -prev vit d level was low at 28  not sure this will help leg pain. but if she feels better that is fine.  may be arthiritic pain, use meds as rec by Dr. Kriste Basque  if not helping ov.  Additional Follow-up by: Tammy Parrett NP,  August 19, 2009 10:20 AM    Additional Follow-up for Phone Call Additional follow up Details #2::    ATC pt and line was busy x 2- will call back Vernie Murders  August 19, 2009 10:23 AM   atc pt and line was busy--will try back later. Randell Loop CMA  August 19, 2009 12:01 PM   called and spoke with pt and she is aware of TP recs of the vit d 2000u daily---she will cont the meds rec by SN and she will call back if this does not help.  pt voiced her understanding Randell Loop CMA  August 19, 2009 1:40 PM

## 2010-03-05 ENCOUNTER — Encounter: Payer: Self-pay | Admitting: Pulmonary Disease

## 2010-03-05 LAB — RENAL FUNCTION PANEL
BUN: 4 mg/dL — ABNORMAL LOW (ref 6–23)
CO2: 25 mEq/L (ref 19–32)
Calcium: 7.5 mg/dL — ABNORMAL LOW (ref 8.4–10.5)
Chloride: 106 mEq/L (ref 96–112)
Creatinine, Ser: 0.52 mg/dL (ref 0.4–1.2)
GFR calc non Af Amer: >60 mL/min (ref >60)
Glucose, Bld: 83 mg/dL (ref 70–99)

## 2010-03-05 LAB — GLUCOSE, CAPILLARY: Glucose-Capillary: 97 mg/dL (ref 70–99)

## 2010-03-05 LAB — CULTURE, BLOOD (ROUTINE X 2): Culture: NO GROWTH

## 2010-03-05 LAB — CBC
HCT: 37.7 % (ref 36.0–46.0)
Hemoglobin: 12.1 g/dL (ref 12.0–15.0)
MCH: 30.3 pg (ref 26.0–34.0)
MCHC: 32.1 g/dL (ref 30.0–36.0)
MCV: 94.3 fL (ref 78.0–100.0)
RDW: 14.5 % (ref 11.5–15.5)

## 2010-03-05 NOTE — H&P (Signed)
[name], [name]              ACCOUNT NO.:  [id number]  MEDICAL RECORD NO.:  [id number]          PATIENT TYPE:  EMS  LOCATION:  [facility]                         FACILITY:  [facility]  PHYSICIAN:  Eduard Clos, MDDATE OF BIRTH:  1925/06/11  DATE OF ADMISSION:  02/26/2010 DATE OF DISCHARGE:                             HISTORY & PHYSICAL   PRIMARY CARE PHYSICIAN:  Lonzo Cloud. Kriste Basque, MD  CHIEF COMPLAINT:  Nausea, vomiting and diarrhea.  HISTORY OF PRESENTING ILLNESS:  An 77 year old female who was just discharged yesterday after being treated for acute diastolic heart failure and non-ST-elevation MI, was brought into the ER, the patient had persistent nausea, vomiting and diarrhea multiple episodes.  In the ER, the patient was found to be hypotensive and WBC count of 32,000 and the patient also had episodes of diarrhea in the ER.  At this time, the patient is found to be in sepsis most likely from C. diff.  The patient has been admitted for further workup.  The patient at this time is confused, only oriented to recognizing her daughter.  She knows where she is, but does not know why she brought here.  She is following commands.  Denies any chest pain or shortness of breath.  Denies any focal deficit, no lose consciousness.  PAST MEDICAL HISTORY: 1. Diastolic CHF with the last 2-D echo showing EF of 60-65% on     February 21, 2010. 2. Recent non-ST MI. 3. Aortic stenosis. 4. History of gout. 5. Chronic prednisone dependent. 6. Chronic venous insufficiency. 7. Peripheral vascular disease. 8. Dyslipidemia. 9. Chronic low back pain. 10.Chronic neck pain. 11.Osteoporosis. 12.Anxiety. 13.Hypertension. 14.Asthmatic bronchitis. 15.Stress incontinence.  MEDICATIONS:  The patient was discharged yesterday includes; 1. Xanax 0.25 mg p.o. b.i.d. 2. Elavil 25 mg p.o. at bedtime. 3. Aspirin 25 mg p.o. daily. 4. Vitamin D 3000 units p.o. daily. 5. Uloric 40 mg daily. 6. Advair  Diskus 250/50 one puff b.i.d. 7. Lasix 60 mg p.o. b.i.d. 8. Atrovent inhaler 0.5 mg p.o. daily. 9. Metoprolol 12.5 mg p.o. b.i.d. 10.Protonix 40 mg p.o. q.12 hourly. 11.MiraLax 17 g p.o. daily. 12.Potassium chloride 40 mEq p.o. t.i.d. 13.Prednisone 5 mg p.o. daily. 14.Zocor 40 mg daily. 15.Ventolin p.r.n.  ALLERGIES:  The patient is allergic to HYDROCODONE, ALLOPURINOL, CRESTOR, MACRODANTIN and CODEINE.  SOCIAL HISTORY:  The patient lives in a skilled nursing facility.  She is a DNR, which was confirmed in the last admission and also at this time, the patient's daughter has confirmed it.  Denies any tobacco, alcohol or drug abuse.  FAMILY HISTORY:  Positive for hypertension and renal disease.  REVIEW OF SYSTEMS:  As per history of present illness.  Specifically, the patient did not complains of any chest pain or did not lose consciousness, but the patient is confused.  PHYSICAL EXAMINATION:  GENERAL:  The patient was examined at the bedside, not in acute distress. VITAL SIGNS:  Blood pressure 103/60, pulse 104 per minute, temperature 101.4, respiration is 28 per minute, O2 sat is 100%. HEENT: Anicteric.  No pallor.  No discharge from ears, eyes, nose, or mouth. CHEST:  Bilateral air entry present.  No rhonchi.  No crepitation. HEART:  S1 and S2 heard. ABDOMEN:  Mildly distended, nontender.  Bowel sounds present. CNS:  The patient is alert, awake, oriented to her name, is able to recognize the daughter, does not know the date and time.  She knows where she is, follows commands, moves upper and lower extremities. EXTREMITIES:  There is chronic changes in the skin, also her hands or legs look clear deformed from chronic arthritis.  There is no cyanosis or any clubbing.  LABORATORY DATA:  EKG shows sinus tachycardia, beats around 108 beats per minute with nonspecific ST-T wave changes with some extended T- waves.  Chest x-ray shows improving perihilar and lower lobe  opacities. CBC; WBC is 31.2, hemoglobin is 16.2 and repeat is 18, hematocrit is 48 and repeat is 53, platelets is 367.  Complete metabolic panel; sodium 137, potassium 6.1 and repeat is 5.5, chloride is 108, glucose of 156 and now is 151, BUN is 71, creatinine 2.5, alkaline phosphatase is 65, AST 41, ALT 59, albumin 3.6, calcium 10.2, lactic acid 5.6, CK-MB is 4.9, troponin less than 0.05.  ASSESSMENT: 1. Sepsis probably secondary to Clostridium difficile colitis. 2. Nausea, vomiting and diarrhea probably from clostridium difficile     colitis. 3. Acute renal failure. 4. Positive troponins with recent non-ST myocardial infarction. 5. Hyperkalemia. 6. Artery stenosis. 7. Dehydration. 8. Hyponatremia.  PLAN:  At this time, we will admit the patient to step-down unit. 1. For her sepsis, which most likely is from C diff.  At this time, we     are going to get C. diff PCR.  We are also going to get blood     culture and urine culture.  At this time, we will be starting     empiric antibiotic vancomycin, Cipro and Flagyl.  If C. diff PCR is     positive, then we will direct antibiotics for focused on C. diff.     At this time, C. diff PCR is pending. 2. Positive troponin with recent non-ST MI.  The patient at this time     requested no active intervention, the patient presently on aspirin     p.o.  We will cycle cardiac markers. 3. Aortic stenosis.  At this time, the patient is not short of breath. 4. Acute renal failure, we will hold antihypertensives specifically     Ace inhibitors and also Lasix and at this time we cannot give     metoprolol as the patient was hypotensive.  We will gently hydrate     the patient, at this time, the patient has already got 750 mL     normal saline.  I am going to give another 500 mL and if the     patient is improving, then we will hold off fluid, otherwise may     have to give more fluids and strict intake and output. 5. Further recommendation based  on the test orders and clinical     course. 6. I will discuss with the patient's daughter about the clinical     nature of the patient's condition.  The patient is a DNR, which we     will honor.     Eduard Clos, MD     ANK/MEDQ  D:  02/27/2010  T:  02/27/2010  Job:  [id number]  cc:   Lonzo Cloud. Kriste Basque, MD  Electronically Signed by Midge Minium MD on 03/05/2010 10:15:01 AM

## 2010-03-05 NOTE — H&P (Signed)
  Andrea Rubio, Andrea Rubio              ACCOUNT NO.:  [id number]  MEDICAL RECORD NO.:  [id number]          PATIENT TYPE:  EMS  LOCATION:  [facility]                         FACILITY:  [facility]  PHYSICIAN:  Eduard Clos, MDDATE OF BIRTH:  [birth year]  DATE OF ADMISSION:  02/26/2010 DATE OF DISCHARGE:                             HISTORY & PHYSICAL   ADDENDUM:  The patient's troponin was positive for which I have consulted Dr. Algie Coffer and also the patient had mild hyperkalemia.  We will just give calcium of any one dose now and then I am going to repeat potassium load along with creatinine in another 3 hours.  If it is still high, at that time we may consider Kayexalate.     Eduard Clos, MD     ANK/MEDQ  D:  02/27/2010  T:  02/27/2010  Job:  [id number]  Electronically Signed by Midge Minium MD on 03/05/2010 10:14:56 AM

## 2010-03-06 ENCOUNTER — Inpatient Hospital Stay (HOSPITAL_COMMUNITY): Payer: Medicare Other

## 2010-03-06 LAB — CBC
HCT: 37.9 % (ref 36.0–46.0)
MCV: 92.4 fL (ref 78.0–100.0)
RBC: 4.1 MIL/uL (ref 3.87–5.11)
RDW: 14.2 % (ref 11.5–15.5)
WBC: 11.4 10*3/uL — ABNORMAL HIGH (ref 4.0–10.5)

## 2010-03-06 LAB — GLUCOSE, CAPILLARY
Glucose-Capillary: 121 mg/dL — ABNORMAL HIGH (ref 70–99)
Glucose-Capillary: 149 mg/dL — ABNORMAL HIGH (ref 70–99)
Glucose-Capillary: 136 mg/dL — ABNORMAL HIGH (ref 70–99)

## 2010-03-06 LAB — RENAL FUNCTION PANEL
Albumin: 2.2 g/dL — ABNORMAL LOW (ref 3.5–5.2)
BUN: 4 mg/dL — ABNORMAL LOW (ref 6–23)
Chloride: 105 mEq/L (ref 96–112)
Glucose, Bld: 97 mg/dL (ref 70–99)
Phosphorus: 2.6 mg/dL (ref 2.3–4.6)
Potassium: 3.2 mEq/L — ABNORMAL LOW (ref 3.5–5.1)
Sodium: 138 mEq/L (ref 135–145)

## 2010-03-06 NOTE — Assessment & Plan Note (Signed)
Summary: rov 4 months fast labs ///kp   CC:  4 month ROV & review of mult medical problems... .  History of Present Illness: 77 y/o WF here for a follow up visit... she has multiple medical problems as noted below...    ~  May11:  she saw TP 3/11 w/ skin tear left forearm from near fall- treated in wound care clinic & improved... labs showed Hg=12.5 but Fe=22  therefore started on FeSO4 Bid but she still feels as though her iron is low, she says> discussed checking labs again today (Hg=13, Fe=57, sat=23%)... otherw stable- breathing is OK using nebs, Advair, Pred... BP controlled on meds; no ch in dizziness, denies syncope etc;  severe arthritic changes and able to make it at home w/ help of her son...  ~  Sep11:  she has severe DJD/ Gout on Uloric40 w/ Uric=3.2 May11... c/o bilat leg pain & known lumbar disc dis followed by Susann Givens & needs f/u appt & reassessment (prev shots in back neck knees w/o much help)... she doesn't want Codeine/ Vicodin so we will Rx w/ Tramadol/ Tylenol in the interim... otherw stable & managing at home w/ son's help- breathing stable w/o exac;  BP controlled on meds;  OK Flu shot today.   ~  February 19, 2010:  4 mo ROV- c/o incr urination on the Torsemide 20mg /d so she cut it back on her own to 1/2 tab daily but notes incr swelling & BNP= 1445> we discussed need to incr back to 20mg /d... also c/o "hard feeling" in abd- denies n/v, d/c, or blood & we discussed need for further eval w/ XRay Abd & poss CT vs eval by GI if nec (XRay shows stool throughout colon- needs lax Rx first w/ Miralax & Senakot-S daily)... Labs show Hg= 13.0, MCV= 96, but Fe= 13 (TIBC=140, Sat=7%) & we will refer to GI for further eval...   Current Problems:   MACULAR DEGENERATION (ICD-362.50)  SINUSITIS, CHRONIC (ICD-473.9) - she is s/p bliat [facility] 1986 by DrKraus...  ~  CT Sinus 11/09 showed chr sinusitis changes in the max & sphenoid regions...  ASTHMATIC BRONCHITIS, ACUTE  (ICD-466.0) - on PRED 5mg /d, ADVAIR 250Bid, & XOPENEX NEBS Tid... stable- min cough; w/o sputum, change in DOE, etc...  ~  PFT's 3/06 showed FVC= 1.49 (68%), FEV1= 1.12 (75%), %1sec=75, mid-flows= 43%pred...  ~  [facility] 8/09 w/ bilat pneumonia... see DC Summary.  ~  CXR 10/09 showed stable cardiomeg, tort Ao, clear lungs, bilat shoulder arthroplasties...  ~  CT Angio Chest 11/09 showed neg for PE, fluid/ mucus plugs in depend LL bronchi...  ~  CXR 10/10 showed stable cardiomeg, bilat humeral head prostheses, NAD.Marland Kitchen.  ~  CXR 1/12 showed COPD, chr changes, ?right basilar opacity ==> subsequent films w/ incr edema.  HYPERTENSION (ICD-401.9) - on VERAPAMIL 240mg /d, DEMEDEX 20mg /d & K20/d... she was prev on Avapro but it was too $$$ and she stopped it- BP's have been OK off  this med... her BP's have stabilized at home and measures 110/60 here today... she denies CP, palpit, syncope, ch in edema...  AORTIC STENOSIS (ICD-424.1) & Hx of CHF (ICD-428.0)- weight = 140#, stable... VI w/ chr edema 1+...  ~  2DEcho 3/06 showed mild AS/AI w/ mod incr AoV thickness & reduced leaflet excursion, mild MR, norm EF=55-65%...   ~  2DEcho 12/08 showed mod inc AoV thickness, mod reduced AV leaflet excursion, c/w mod AS, mild AI & mean grad= ;  norm LVF w/ EF=  65% and no wall motion abn etc...  PERIPHERAL VASCULAR DISEASE (ICD-443.9) - on ASA 81mg /d... she can't walk enough to elicit discomfort, no rest pain or lesions...  ~  ABI's 12/01 showed .88 on R, and .95 on L...  ~  MRI showed small vessel dis & small infarct noted...  VENOUS INSUFFICIENCY (ICD-459.81) - on low sodium diet, Demedex, KCl...  HYPERLIPIDEMIA (ICD-272.4) - on diet alone... prev on Pravachol, but intol to other Statins... takes Fish Oil  ~2000mg /d rec by her ophthalmologist...  ~  FLP 9/05 ?on Prav40 showed TChol 176, TG 190, HDL 55, LDL 83...  ~  FLP 4/10 showed TChol 211, TG 153, HDL 44, LDL 128... she refuses med Rx- continue diet efforts.   ~  it's hard to get her in for fasting labs...  HIATAL HERNIA (ICD-553.3) - on PRILOSEC 20mg /d & METACLOPRAMIDE 10mg Qhs... GERD (ICD-530.81) - EGD 3/06 by DrStark showed 3cmHH, mild esophagitis...  DIVERTICULOSIS, COLON (ICD-562.10) - last colonoscopy was 6/06 by [facility] showed divertics and ischemic colitis...   STRESS INCONTINENCE (ICD-788.39) - she has hx UTI's on & off...  ~  10/10: urine c/s +EColi & Rx'd w/ Cipro.  DEGENERATIVE JOINT DISEASE (ICD-715.90) - she has severe DJD & Gout w/ deformities- Foot XRay showed Charcot foot deformity, severe degenerative dis, etc...  added TRAMADOL + TYLENOL Prn... GOUT, UNSPECIFIED (ICD-274.9) - labs 4/10 showed Uric Acid level= 7.9.Marland KitchenMarland Kitchen she has hx of gout & hyperuricemia but allergic to Allopurinol w/ rash... therefore on ULORIC 40mg /d... Uric Acid level 5/11 on Uloric40 = 3.2 LOW BACK PAIN (ICD-724.2) NECK PAIN (ICD-723.1) - eval by Susann Givens ==> DrLewitt 2010 w/ MRI & Rx  w/ Lidoderm, Tylenol, Lyrica- now on NEURONTIN... OSTEOPOROSIS (ICD-733.00) - on FOSAMAX 70mg /wk, Ca++, Vits, & Vit D...  ~  labs 4/10 showed Vit D level = 28... rec> start Vit D OTC 1000 u daily.  ~  labs 5/11 showed Vit D level = 32... rec incr Vit D to 2000 u daily.  ANXIETY (ICD-300.00) - on ALPRAZOLAM 0.25mg  Tid Prn... under stress w/ 75 y/o son had MI & stent in 2010.  Hx of ANEMIA (ICD-285.9) - hx anemia req 2U transfusion 6/06 hosp for ischemic colitis...   ~  Hg= 12 - 13 over 2008-9.  ~  labs 8/09 hosp showed Hg= 14  ~  labs 4/10 showed Hg= 12.2  ~  labs 3/11 showed Hg= 12.5, MCV= 92, Fe= 22 (9%sat)... start FeSO4 Bid.  ~  labs 5/11 showed = 13.0, MCV= 94, Fe= 57 (23%)... continue Fe supplement.   Preventive Screening-Counseling & Management  Alcohol-Tobacco     Smoking Status: never  Allergies: 1)  ! Codeine 2)  ! Allopurinol 3)  ! Morphine 4)  ! Hydrocodone  Comments:  Nurse/Medical Assistant: The patient's medications and allergies were reviewed with the  patient and were updated in the Medication and Allergy Lists.  Past History:  Past Medical History: MACULAR DEGENERATION (ICD-362.50) SINUSITIS, CHRONIC (ICD-473.9) ASTHMATIC BRONCHITIS, ACUTE (ICD-466.0) Hx of PNEUMONIA (ICD-486) HYPERTENSION (ICD-401.9) AORTIC STENOSIS (ICD-424.1) Hx of CHF (ICD-428.0) PERIPHERAL VASCULAR DISEASE (ICD-443.9) VENOUS INSUFFICIENCY (ICD-459.81) HYPERLIPIDEMIA (ICD-272.4) HIATAL HERNIA (ICD-553.3) GERD (ICD-530.81) DIVERTICULOSIS, COLON (ICD-562.10) STRESS INCONTINENCE (ICD-788.39) DEGENERATIVE JOINT DISEASE (ICD-715.90) GOUT, UNSPECIFIED (ICD-274.9) LOW BACK PAIN (ICD-724.2) NECK PAIN (ICD-723.1) OSTEOPOROSIS (ICD-733.00) ANXIETY (ICD-300.00) ANEMIA (ICD-285.9)  Past Surgical History: S/P lumbar laminectomy S/P bilat shoulder surgeries  Family History: Reviewed history from 10/16/2009 and no changes required. heart disease - brother rheumatism - mother stroke -  father HTN -  mother, father, all 4 brothers, both sisters  Social History: Reviewed history from 10/16/2009 and no changes required. 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  The patient denies anorexia, fever, weight loss, weight gain, vision loss, decreased hearing, hoarseness, chest pain, syncope, prolonged cough, headaches, hemoptysis, melena, hematochezia, hematuria, incontinence, suspicious skin lesions, transient blindness, depression, unusual weight change, abnormal bleeding, enlarged lymph nodes, and angioedema.    Vital Signs:  Patient profile:   77 year old female Height:      59 inches Weight:      140 pounds BMI:     28.38 O2 Sat:      99 % on Room air Temp:     99.0 degrees F oral Pulse rate:   70 / minute BP sitting:   110 / 60  (right  arm) Cuff size:   regular  Vitals Entered By: Randell Loop CMA (February 19, 2010 1:50 PM)  O2 Sat at Rest %:  99 O2 Flow:  Room air CC: 4 month ROV & review of mult medical problems...  Is Patient Diabetic? No Pain Assessment Patient in pain? no      Comments meds updated today with pt   Physical Exam  Additional Exam:  WD, Petite, sl overweight, 77 y/o WF in NAD... she is <5' tall, weight 140# GENERAL:  Alert & oriented; pleasant & cooperative... HEENT:  [place]/AT, EACs-clear, TMs-wnl, NOSE-clear, THROAT-clear & wnl... known macular degen per ophthal... NECK:  Supple w/ decrROM; no JVD; normal carotid impulses w/o bruits; no thyromegaly or nodules palpated; no lymphadenopathy. CHEST:  Decr BS bilat, Coarse BS, no wheezing or rhonchi... HEART:  Regular Rhythm;  gr 1-2/6 SEM without rubs or gallops detected... ABDOMEN:  Soft & nontender; normal bowel sounds; no organomegaly or masses palpated... EXT: +hand deformities, w/ tophi & mod arthritic changes & contractures... right knee arthritis & severe foot deformities as well. no varicose veins/+venous insuffic/ tr edema. NEURO:  CN's intact x reduced vision... diffusely weak, without focal changes... +gait abn... DERM:  no lesions- ecchymoses, thin skin, etc...    CXR  Procedure date:  02/19/2010  Findings:      CHEST - 2 VIEW Comparison: Chest x-ray 01/07/2009   Findings: The cardiac silhouette, mediastinal and hilar contours are stable.  There is ill-defined right lower lobe lung opacity worrisome for developing infiltrate.  Chronic underlying  lung changes/COPD are again demonstrated.  Extensive costochondral calcifications are again noted.  No edema or effusion.   IMPRESSION: Suspect developing right lower lobe infiltrate.  Recommend clinical correlation and follow-up chest x-ray.   Read By:  Cyndie Chime,  M.D.   X-ray  Procedure date:  02/19/2010  Findings:      ABDOMEN - 2 VIEW Comparison: None     Findings: There is a moderate-to-large amount of stool throughout the colon suggesting constipation.  No distended small bowel loops of free air.  The bony structures are intact with advanced degenerative changes of the lumbar spine.   IMPRESSION: Moderate to large amount of stool throughout the colon suggesting constipation.  No findings for obstruction or perforation.   Read By:  Cyndie Chime,  M.D.   MISC. Report  Procedure date:  02/19/2010  Findings:      BMP (METABOL)  Sodium                    141 mEq/L                   135-145   Potassium                 4.7 mEq/L                   3.5-5.1   Chloride                  103 mEq/L                   96-112   Carbon Dioxide            28 mEq/L                    19-32   Glucose              [H]  102 mg/dL                   09-81   BUN                       13 mg/dL                    1-91   Creatinine                0.7 mg/dL                   4.7-8.2   Calcium                   9.0 mg/dL                   9.5-62.1   GFR                       89.35 mL/min                >60.00  Hepatic/Liver Function Panel (HEPATIC)   Total Bilirubin           0.5 mg/dL                   3.0-8.6   Direct Bilirubin          0.1 mg/dL                   5.7-8.4   Alkaline Phosphatase      55 U/L                      39-117   AST                  [H]  40 U/L                      0-37   ALT                  [H]  37 U/L                      0-35   Total Protein        [L]  5.9 g/dL  6.0-8.3   Albumin              [L]  3.2 g/dL                    1.6-1.0  CBC Platelet w/Diff (CBCD)   White Cell Count     [H]  13.7 K/uL                   4.5-10.5   Red Cell Count            4.01 Mil/uL                 3.87-5.11   Hemoglobin                13.0 g/dL                   96.0-45.4   Hematocrit                38.5 %                      36.0-46.0   MCV                       96.0 fl                     78.0-100.0   Platelet  Count            187.0 K/uL                  150.0-400.0   Neutrophil %         [H]  78.6 %                      43.0-77.0   Lymphocyte %         [L]  11.8 %                      12.0-46.0   Monocyte %                8.4 %                       3.0-12.0   Eosinophils%              0.6 %                       0.0-5.0   Basophils %               0.6 %                       0.0-3.0  Comments:      TSH (TSH)   FastTSH                   2.03 uIU/mL                 0.35-5.50   IBC Panel (IBC)   Iron                 [L]  13 ug/dL                    09-811   Transferrin          [L]  139.9 mg/dL  212.0-360.0   Iron Saturation      [L]  6.6 %                       20.0-50.0  B-Type Natiuretic Peptide (BNPR)  B-Type Natriuetic Peptide                        [H]  1445.5 pg/mL                0.0-100.0   Impression & Recommendations:  Problem # 1:  ABDOMINAL DISTENSION (ICD-787.3) Plain XRay shows obstipation w/ stool throughout colon>  rec laxatives w/ clean out, then maintain w/ MIRALAX & SENAKOT-S to prevent similar prob... refer to GI vs CT for further eval.  Orders: T-Abdomen 2-view (74020TC) TLB-BMP (Basic Metabolic Panel-BMET) (80048-METABOL) TLB-Hepatic/Liver Function Pnl (80076-HEPATIC) TLB-CBC Platelet - w/Differential (85025-CBCD) TLB-TSH (Thyroid Stimulating Hormone) (84443-TSH) TLB-IBC Pnl (Iron/FE;Transferrin) (83550-IBC) TLB-BNP (B-Natriuretic Peptide) (83880-BNPR) TLB-Uric Acid, Blood (84550-URIC) TLB-Sedimentation Rate (ESR) (85652-ESR) Radiology Referral (Radiology)  Problem # 2:  ASTHMATIC BRONCHITIS, ACUTE (ICD-466.0) She has underlying COPD>  continue Advair, NEBS, Mucinex, etc... Her updated medication list for this problem includes:    Albuterol Sulfate (2.5 Mg/66ml) 0.083% Nebu (Albuterol sulfate) ..... Inhale 1 vial via hhn three times a day    Advair Diskus 250-50 Mcg/dose Misc (Fluticasone-salmeterol) .Marland Kitchen... 1 inhalation two times a  day  Orders: T-2 View CXR (71020TC)  Problem # 3:  HYPERTENSION (ICD-401.9) BP is well controlled>  same med. Her updated medication list for this problem includes:    Verapamil Hcl Cr 240 Mg Tbcr (Verapamil hcl) .Marland Kitchen... 1 tab by mouth once daily    Torsemide 20 Mg Tabs (Torsemide) .Marland Kitchen... 1/2  tab by mouth once daily  Problem # 4:  ELEV BNP>>> She will need to maintain Demedex at at least 20mg /d due to fluid retention/ BNP= 1445... remionded of low sodium diet, etc...  Problem # 5:  AORTIC STENOSIS (ICD-424.1) She will need f/u 2DEcho as well...  Problem # 6:  OTHER MEDICAL PROBLEMS AS NOTED>>>  Complete Medication List: 1)  Albuterol Sulfate (2.5 Mg/54ml) 0.083% Nebu (Albuterol sulfate) .... Inhale 1 vial via hhn three times a day 2)  Ipratropium Bromide 0.06 % Soln (Ipratropium bromide) .... Inhale 1 vial via hhn three times a day 3)  Advair Diskus 250-50 Mcg/dose Misc (Fluticasone-salmeterol) .Marland Kitchen.. 1 inhalation two times a day 4)  Prednisone 5 Mg Tabs (Prednisone) .... Take 1 tablet by mouth once a day 5)  Verapamil Hcl Cr 240 Mg Tbcr (Verapamil hcl) .Marland Kitchen.. 1 tab by mouth once daily 6)  Torsemide 20 Mg Tabs (Torsemide) .... 1/2  tab by mouth once daily 7)  Potassium Chloride Crys Cr 20 Meq Tbcr (Potassium chloride crys cr) .Marland Kitchen.. 1 tab daily 8)  Omeprazole 20 Mg Cpdr (Omeprazole) .Marland Kitchen.. 1 tab daily taken 30 min before a meal. 9)  Reglan 10 Mg Tabs (Metoclopramide hcl) .... Take 1 tab by mouth at bedtime 10)  Miralax Powd (Polyethylene glycol 3350) .... Dissolve one capful in 8oz water daily 11)  Uloric 40 Mg Tabs (Febuxostat) .... Take 1 tab by mouth once daily... 12)  Fosamax 70 Mg Tabs (Alendronate sodium) .... Take one tablet by mouth once weekly 13)  Caltrate 600+d 600-400 Mg-unit Tabs (Calcium carbonate-vitamin d) .Marland Kitchen.. 1 tab by mouth two times a day for bones... 14)  Multivitamins Tabs (Multiple vitamin) .... Take 1 tablet by mouth once a day 15)  Vitamin D3  1000 Unit Caps  (Cholecalciferol) .... Take 1 cap by mouth once daily... 16)  Neurontin 100 Mg Caps (Gabapentin) .... As directed by drlewitt 17)  Amitriptyline Hcl 25 Mg Tabs (Amitriptyline hcl) .... Take 1 tab by mouth at bedtime 18)  Alprazolam 0.25 Mg Tabs (Alprazolam) .Marland Kitchen.. 1 by mouth once daily as needed nerves 19)  Lotrisone 1-0.05 % Crea (Clotrimazole-betamethasone) .... Apply to rash two times a day 20)  Ferrous Sulfate 325 (65 Fe) Mg Tabs (Ferrous sulfate) .... Take 1 tablet by mouth two times a day 21)  Tramadol Hcl 50 Mg Tabs (Tramadol hcl) .... Take 1 tab by mouth every 6 h as needed for pain (may take w/ tylenol)...  Patient Instructions: 1)  Today we updated your med list- see below.... 2)  We decided to stop the [facility] (Torsemide) for now & use it 1/2 tab as needed for swelling...  also please plan to use the MIRALAX 1/2 capful in water daily for your bowels... 3)  Today we checked your follow up CXR & Abd films, plus your non-fasting blood work.... 4)  We will arrange for an ABD ULTRASOUND to check your intra-abdominal organs & see if we can determine a reason for the swelling (if this is neg we will ask DrStark to check you for another opinion).Marland KitchenMarland Kitchen 5)  Call for any questions.Marland KitchenMarland Kitchen 6)  Let's plan a follow up visit in 4 months.Marland KitchenMarland Kitchen

## 2010-03-06 NOTE — Discharge Summary (Signed)
NAMEANYRA, KAUFMAN              ACCOUNT NO.:  [id number]  MEDICAL RECORD NO.:  [id number]          PATIENT TYPE:  INP  LOCATION:  2010                         FACILITY:  [facility]  PHYSICIAN:  Richarda Overlie, MD       DATE OF BIRTH:  1925-09-09  DATE OF ADMISSION:  02/22/2010 DATE OF DISCHARGE:  02/26/2010                        DISCHARGE SUMMARY - REFERRING   PRIMARY CARE PHYSICIAN:  Lonzo Cloud. Kriste Basque, MD  DISCHARGE DIAGNOSES: 1. Acute diastolic heart failure. 2. Non-ST-elevation myocardial infarction. 3. Gastroesophageal reflux disease. 4. Hypertension. 5. Asthmatic bronchitis. 6. Moderate-to-severe aortic stenosis with mild aortic regurgitation,     mild mitral regurgitation. 7. Hiatal hernia. 8. Stress incontinence. 9. Chronic venous insufficiency. 10.Peripheral vascular disease. 11.Dyslipidemia. 12.Low back pain which is chronic. 13.Chronic neck pain. 14.Osteoporosis. 15.Anxiety. 16.Macular degeneration.  PROCEDURES:  Two-D echo done on February 24, 2010, shows an EF of 60-65%. There is hypokinesis of the apical myocardium, grade 1 diastolic dysfunction, aortic stenosis which appears to be severe with a valve area of 0.83 cm2, dilated left atrium. Chest x-ray upon admission shows bilateral pulmonary infiltrates consistent with pulmonary edema.  Abdominal x-ray shows moderate distal stools.  SUBJECTIVE:  An 77 year old female with a history of diastolic heart failure who presented with shortness of breath that started in the middle of the night, progressing to the point that the patient could hardly talk.  The patient experienced some chest pain in her lower rib cage bilaterally.  Upon admission, the patient was found to have nonspecific ST-T changes and was found to have an abnormal troponin of 0.29.  The patient was admitted for further evaluation.  HOSPITAL COURSE: 1. Non-ST elevation MI and pulmonary edema resulting in acute     respiratory failure.  The patient  was initially placed in step-down     unit and placed on BiPAP.  The patient was started on high-dose IV     Lasix and also treated with nitroglycerin.  Her breathing continued     to improve.  She continued to have elevated cardiac markers with a     peak troponin of 1.28 and a peak CK-MB of 9.1.  Her BNP was     elevated at 620, subsequently it increased to 781 despite diuresis.     Dr. Algie Coffer was consulted and it was felt that the patient had     experienced non-Q-wave MI and probably has underlying coronary     artery disease.  The patient does not want any aggressive     intervention at this time and she will be medically managed.  She     had a palliative care meeting for goals of care and has requested     to be DNR/DNI.  She would accept the current medical management but     would not want any aggressive intervention like a cardiac cath or     any other surgical procedure.  She also has severe aortic stenosis     and is not a candidate for any kind of intervention. 2. Asthmatic bronchitis.  The patient is on chronic prednisone which     will be continued.  3. Hypertension.  The patient will continue on metoprolol, lisinopril     and her Lasix. 4. Severe aortic stenosis.  As mentioned above, she is not a surgical     candidate.  DISPOSITION:  PT, OT consultation was obtained.  The patient is requested to be discharged home to live with her son.  She has a rolling walker at home.  She will be set up for home health PT, home health RN and occupational therapy.  DISCHARGE MEDICATIONS: 1. Xanax 0.25 mg p.o. b.i.d. 2. Elavil 25 mg p.o. at bedtime. 3. Aspirin 325 p.o. daily. 4. Vitamin D3 1000 units p.o. daily. 5. Uloric 40 mg p.o. daily. 6. Advair 250/50 one puff q. 12. 7. Lasix 60 mg p.o. twice daily. 8. Atrovent inhaler 0.5 mg three times a day. 9. Lisinopril 5 mg p.o. daily. 10.Metoprolol 12.5 mg p.o. b.i.d. 11.Protonix 40 mg p.o. q. 12. 12.MiraLax 17 grams p.o.  daily. 13.Potassium 40 mEq p.o. b.i.d. 14.Prednisone 5 mg p.o. daily. 15.Zocor 40 mg p.o. daily. 16.Ventolin 2.5 mg inhaler three times a day.     Richarda Overlie, MD     NA/MEDQ  D:  02/26/2010  T:  02/26/2010  Job:  [id number]  cc:   Lonzo Cloud. Kriste Basque, MD  Electronically Signed by Richarda Overlie MD on 03/06/2010 04:25:43 PM

## 2010-03-07 LAB — BASIC METABOLIC PANEL
BUN: 6 mg/dL (ref 6–23)
Calcium: 7.8 mg/dL — ABNORMAL LOW (ref 8.4–10.5)
Creatinine, Ser: 0.67 mg/dL (ref 0.4–1.2)
GFR calc Af Amer: >60 mL/min (ref >60)
GFR calc non Af Amer: >60 mL/min (ref >60)

## 2010-03-07 LAB — GLUCOSE, CAPILLARY
Glucose-Capillary: 92 mg/dL (ref 70–99)
Glucose-Capillary: 136 mg/dL — ABNORMAL HIGH (ref 70–99)

## 2010-03-07 LAB — CBC
MCH: 30.1 pg (ref 26.0–34.0)
MCV: 93.1 fL (ref 78.0–100.0)
Platelets: 278 10*3/uL (ref 150–400)
RDW: 14.4 % (ref 11.5–15.5)

## 2010-03-08 LAB — BASIC METABOLIC PANEL
BUN: 9 mg/dL (ref 6–23)
Calcium: 8.2 mg/dL — ABNORMAL LOW (ref 8.4–10.5)
Creatinine, Ser: 0.57 mg/dL (ref 0.4–1.2)
GFR calc non Af Amer: >60 mL/min (ref >60)
Glucose, Bld: 95 mg/dL (ref 70–99)
Potassium: 3.3 mEq/L — ABNORMAL LOW (ref 3.5–5.1)

## 2010-03-08 LAB — CBC
HCT: 34.4 % — ABNORMAL LOW (ref 36.0–46.0)
MCH: 30.1 pg (ref 26.0–34.0)
MCHC: 32.6 g/dL (ref 30.0–36.0)
MCV: 92.5 fL (ref 78.0–100.0)
RDW: 14.4 % (ref 11.5–15.5)

## 2010-03-08 LAB — GLUCOSE, CAPILLARY
Glucose-Capillary: 134 mg/dL — ABNORMAL HIGH (ref 70–99)
Glucose-Capillary: 98 mg/dL (ref 70–99)

## 2010-03-09 LAB — GLUCOSE, CAPILLARY
Glucose-Capillary: 101 mg/dL — ABNORMAL HIGH (ref 70–99)
Glucose-Capillary: 134 mg/dL — ABNORMAL HIGH (ref 70–99)
Glucose-Capillary: 130 mg/dL — ABNORMAL HIGH (ref 70–99)

## 2010-03-09 LAB — CBC
HCT: 36.4 % (ref 36.0–46.0)
MCV: 92.4 fL (ref 78.0–100.0)
Platelets: 269 10*3/uL (ref 150–400)
RBC: 3.94 MIL/uL (ref 3.87–5.11)
WBC: 12.3 10*3/uL — ABNORMAL HIGH (ref 4.0–10.5)

## 2010-03-09 LAB — BASIC METABOLIC PANEL
BUN: 13 mg/dL (ref 6–23)
Chloride: 104 mEq/L (ref 96–112)
Potassium: 3.2 mEq/L — ABNORMAL LOW (ref 3.5–5.1)
Sodium: 139 mEq/L (ref 135–145)

## 2010-03-09 NOTE — Discharge Summary (Signed)
Andrea Rubio, Andrea Rubio              ACCOUNT NO.:  [id number]  MEDICAL RECORD NO.:  [id number]          PATIENT TYPE:  INP  LOCATION:  1824                         FACILITY:  [facility]  PHYSICIAN:  Marinda Elk, M.D.DATE OF BIRTH:  [birth year]  DATE OF ADMISSION:  02/22/2010 DATE OF DISCHARGE:                              DISCHARGE SUMMARY   PRIMARY CARE DOCTOR:  Lonzo Cloud. Kriste Basque, MD  CHIEF COMPLAINT:  Dyspnea.  HISTORY:  This is an 77 year old with past medical history of diastolic heart failure who recently saw her doctor on the 18th, and at that time, her Lasix was decreased and she comes in for shortness of breath that started in the middle of the night.  She relates her shortness of breath as it is progressively beginning worse to a point where she can hardly talk.  She has experienced some chest pain in her lower rib cage bilaterally, but no fever, cough.  She has not been nauseated.  She has had some diaphoresis with this shortness of breath as it is progressively getting worse, but no dizziness.  ALLERGIES:  CODEINE, NITROFURANTOIN, CRESTOR, ALLOPURINOL, and VICODIN.  PAST MEDICAL HISTORY: 1. __________ cellulitis. 2. Urinary tract infection. 3. Chronic diastolic heart failure. 4. Hypertension. 5. __________. 6. __________. 7. Venous insufficiency. 8. Hyperlipidemia. 9. Gastroesophageal reflux disease. 10.Diverticulosis. 11.Osteoporosis.  DISCHARGE MEDICATIONS: 1. She is on albuterol MDI. 2. Atrovent 0.06% solution inhaler t.i.d. 3. Advair 250/50 mcg inhaled daily. 4. Prednisone 5 mg daily. 5. Verapamil CR 240 mg p.o. daily. 6. Torsemide 20 mg half a tab daily. 7. K-Dur 20 mEq daily. 8. Omeprazole 20 mg before meals. 9. Reglan 10 mg by mouth at bedtime. 10.MiraLax powder 17 g daily. 11.Uloric 40 mg daily. 12.Fosamax 20 mg weekly. 13.Caltrate 600 units tab daily. 14.Multivitamin 1 tab daily. 15.Vitamin D3 1000 units daily. 16.Neurontin 100 mg  daily. 17.Amitriptyline 25 mg daily. 18.Alprazolam 0.25 mg by mouth once daily as needed. 19.Lotrisone cream applied __________. 20.Ferrous sulfate 325 mg b.i.d. 21.Tramadol 50 mg daily.  SOCIAL HISTORY:  She denies tobacco, alcohol, or drugs.  She lives with her son at home.  FAMILY HISTORY:  Noncontributory only for hypertension and renal disease.  PHYSICAL EXAMINATION:  VITAL SIGNS:  Temperature 97, pulse 104, blood pressure 148/64.  She was __________ satting 100% on 3 L. GENERAL:  She is awake, alert and oriented x3, not able to speak in full sentences. HEENT: Moist mucous membrane. NECK:  Positive JVD.  No carotid bruits. CARDIOVASCULAR:  She is tachycardic with regular rate and no appreciated S1 and S2 secondary to crackles and fast breathing.  Positive JVD. LUNGS:  She has moderate breathing, tachypneic with diffuse crackles and wheezing bilaterally. ABDOMEN:  Positive bowel sounds.  Nontender, nondistended and soft. EXTREMITIES:  Positive pulses.  No clubbing, cyanosis, or edema. SKIN:  __________ rashes. NEUROLOGIC:  Nonfocal.  LABORATORIES ON ADMISSION:  BNP of 622.  Her sodium was 140, potassium 3.3, chloride 106, bicarb of 23, glucose of 121, BUN of 14, creatinine 0.6, AST 39, ALT 35, bilirubin 0.4, total protein 5.9, albumin of 2.8. Her white count is 13.6 with a hemoglobin of  11.8, platelet count of 222, and ANC of 10.0.  Her first set of cardiac care markers shows troponin of 0.29.  EKG showed sinus rhythm and no specific ST-segment changes.  No T-wave inversions either.  ASSESSMENT AND PLAN: 1. Acute respiratory failure, most likely secondary to volume overload     secondary to decrease in her diuretic, torsemide.  Would admit her     to a step-down unit as she is currently on BiPAP.  We will continue     BiPAP, has JVD and crackles as shown on chest x-ray, which shows     bilateral pulmonary infiltrates.  We will start her on     nitroglycerin, Lasix,  oxygen, and morphine.  We will get strict Is     and Os and repeat cardiac enzymes and see the trend.  At this time,     after putting her on nitroglycerin and 120 of Lasix, her shortness     of breath has improved.  She says her chest pain has relieved.  We     will use morphine, nitroglycerin as needed for acute respiratory     failure and check a BMET in the morning to follow up with her     electrolytes. 2. Leukocytosis is probably secondary to acute respiratory failure.     Her chest x-ray shows significant pulmonary edema.  Her BMP is     high.  This leukocytosis could be secondary to stress.  At this     time, she does not have a fever or cough.  So, we will check a     chest x-ray in the morning and will get blood culture, but we will     hold on antibiotics now. 3. Elevated cardiac markers, this could be secondary to severe acute     decompensated heart failure.  She is currently chest pain free, so     we will get a 2-D echo.  We will cycle her cardiac enzymes.  She     does have a     history of heart failure. 4. Hypertension.  We will continue on Lasix and nitroglycerin and add     verapamil. 5. Hyperlipidemia.  Continue statins.  No changes were made.  Check a     fasting lipid panel.     Marinda Elk, M.D.     AF/MEDQ  D:  02/22/2010  T:  02/22/2010  Job:  [id number]  Electronically Signed by Lambert Keto M.D. on 03/09/2010 11:46:01 AM

## 2010-03-10 LAB — BASIC METABOLIC PANEL
BUN: 15 mg/dL (ref 6–23)
Calcium: 8.5 mg/dL (ref 8.4–10.5)
Creatinine, Ser: 0.71 mg/dL (ref 0.4–1.2)
GFR calc Af Amer: >60 mL/min (ref >60)
GFR calc non Af Amer: >60 mL/min (ref >60)

## 2010-03-10 LAB — CBC
MCH: 30 pg (ref 26.0–34.0)
MCHC: 32.3 g/dL (ref 30.0–36.0)
MCV: 92.8 fL (ref 78.0–100.0)
Platelets: 259 10*3/uL (ref 150–400)
RBC: 4.04 MIL/uL (ref 3.87–5.11)
RDW: 14.5 % (ref 11.5–15.5)

## 2010-03-10 LAB — GLUCOSE, CAPILLARY: Glucose-Capillary: 129 mg/dL — ABNORMAL HIGH (ref 70–99)

## 2010-03-10 NOTE — Discharge Summary (Signed)
Andrea Rubio, Andrea Rubio              ACCOUNT NO.:  [id number]  MEDICAL RECORD NO.:  [id number]           PATIENT TYPE:  I  LOCATION:  3701                         FACILITY:  [facility]  PHYSICIAN:  Hartley Barefoot, MD    DATE OF BIRTH:  06-14-1925  DATE OF ADMISSION:  02/27/2010 DATE OF DISCHARGE:  03/10/2010                        DISCHARGE SUMMARY - REFERRING   PRIMARY CARE PHYSICIAN:  Lonzo Cloud. Kriste Basque, MD  DISCHARGE DIAGNOSES: 1. Sepsis secondary to diverticulitis. 2. Acute renal failure pre Renal. 3. Diverticulitis. 4. Dehydration. 5. Hyperkalemia secondary to renal failure. 6. Aortic stenosis. 7. Hyponatremia. 8. E coli urinary tract infection. 9. Diastolic heart failure exacerbation. 10.Stress gastritis.  OTHER PAST MEDICAL HISTORY: 1. Severe aortic stenosis. 2. Gout, on chronic low dose of prednisone. 3. Recent non-ST segment elevation myocardial infarction. 4. Gout. 5. Hypertension. 6. Asthma. 7. Mild mitral regurgitation. 8. Hiatal hernia. 9. Chronic venous insufficiency. 10.Peripheral vascular disease. 11.Dyslipidemia. 12.Chronic low back pain. 13.Osteoporosis. 14.Anxiety. 15.Macular degeneration.  DISCHARGE MEDICATIONS: 1. Ciprofloxacin 500 mg p.o. b.i.d. for 5 more days. 2. Lasix 40 mg p.o. daily. 3. Metronidazole 500 mg every 8 hours for 5 more days. 4. Pantoprazole 40 mg p.o. b.i.d. 5. Tylenol one to two tablets by mouth every 6 hours as needed 325 mg. 6. Advair Diskus 250/50 mcg one puff inhaled twice daily. 7. Albuterol inhaler two puffs twice daily. 8. Albuterol nebulization 2.5 mg inhaled three times a day. 9. Alprazolam 0.25 mg one tablet by mouth daily as needed. 10.Artificial tears one drop in both eyes three times a day as needed. 11.Calcium carbonate one tablet by mouth twice daily. 12.Ferrous sulfate 325 mg one tablet by mouth twice daily. 13.Ipratropium one via inhaled three times a day. 14.Lotrisone application topically twice  daily. 15.Multivitamins one tablet by mouth daily. 16.Potassium chloride 20 mEq p.o. daily. 17.Prednisone 5 mg p.o. every morning. 18.Simvastatin 40 mg p.o. daily. 19.Tramadol one tablet by mouth every 6 hours as needed for pain. 20.Uloric 40 mg one tablet by mouth daily. 21.Vitamin D one tablet by mouth daily.  MEDICATIONS STOPPED DURING THIS HOSPITALIZATION:  Amitriptyline, metoclopramide, furosemide was changed to once a day, lisinopril, and metoprolol.  DISPOSITION AND FOLLOWUP:  Ms. Schnackenberg will be transferred to skilled nursing facility.  She will need to follow up with her primary care physician in one or two weeks.  She will need a BMET to follow potassium level and renal function.  We will need to consider restart her metoprolol as blood pressure allows it.  Consider also start lisinopril in one or two weeks if the renal function continued to be stable.  We will need to advance her diet as tolerated.  She will need to work with physical therapy.  HOSPITAL COURSE:  This is an 77 year old who was admitted with nausea, vomiting, abdominal pain, and diarrhea.  She was admitted with sepsis secondary to diverticulitis.  PROBLEMS: 1. Sepsis secondary to acute sigmoid diverticulitis.  The patient     received aggressive fluid hydration.  CT abdomen shows     diverticulitis.  C diff was negative.  An NG tube was placed     because the  patient's abdomen was mildly distended.  NG tube was     subsequently removed.  Her lactic acid returned to normal.  White     blood cells were trending down. 2. Diverticulitis.  Abdominal pain has resolved.  The patient     continued to have some loose stools.  C diff was negative.  We will     continue with Cipro and Flagyl for 5 more days for a total of     14 days. 3. E coli urinary tract infection.  She will finish the 7 days of     Cipro.  She will need to be on Cipro for the diverticulitis also. 4. Abdominal pain secondary to  diverticulitis has resolved. 5. Acute renal failure with hyperkalemia secondary to prerenal     secondary to sepsis shock. 6. Her creatinine increased up to 2.9 and a BUN of 71.  On the day of     prior to discharge, creatinine was 0.67, BUN at 6.  Her renal     failure has resolved.  We will need to consider restart her     lisinopril in one or two weeks. 7. Stress gastritis.  The patient had a positive gastric contents     positive for blood.  Her Protonix dose was increased to 40 mg p.o.     b.i.d.  Hemoglobin has remained stable. 8. Gout, on chronic low prednisone.  She received initially a stress     dose steroid, but she will be discharged on her home dose of     prednisone. 9. Severe aortic stenosis.  She is followed by Dr. Algie Coffer.  He     evaluated the patient during this admission.  She is not a surgical     candidate.  On the day of discharge, the patient was in improved condition.  No abdominal pain.  She still has some loose stool.  Blood pressure 103/63, pulse 91, sats 98% on room air, and temperature 97.  LABORATORY DATA:  Sodium 140, potassium 3.5, chloride 108, bicarb 24, BUN 6, creatinine 0.67, glucose 101.  White blood cells 13, hemoglobin 11.3, platelets 278.  The patient was discharged in improved condition.     Hartley Barefoot, MD     BR/MEDQ  D:  03/07/2010  T:  03/07/2010  Job:  [id number]  cc:   Lonzo Cloud. Kriste Basque, MD  Electronically Signed by Hartley Barefoot MD on 03/10/2010 08:30:56 AM

## 2010-03-11 NOTE — Progress Notes (Signed)
[name], STORLIE              ACCOUNT NO.:  [id number]  MEDICAL RECORD NO.:  [id number]          PATIENT TYPE:  INP  LOCATION:  2609                         FACILITY:  [facility]  PHYSICIAN:  Lonia Blood, M.D.       DATE OF BIRTH:  Jun 27, 1925                                PROGRESS NOTE   ADMITTING PHYSICIAN: Eduard Clos, MD  PRIMARY CARE PHYSICIAN: Lonzo Cloud. Kriste Basque, MD  CHIEF COMPLAINT/REASON FOR ADMISSION: Andrea Rubio is a 77 year old female patient, who had just been discharged 24 hours prior after being treated for acute diastolic heart failure due to a non-ST-segment elevated MI.  During that hospitalization, the patient did require BiPAP because of pulmonary edema and palliative care medicine consultation for goals of care was performed and the patient is a full DNR.  She was brought back to the ER because of persistent nausea, vomiting, and diarrhea.  She was found to be hypotensive with a white count of 32,000.  It was presumed at the time of admission that she had C diff colitis.  On clinical exam, the patient was febrile with a temperature of 101.4. Her BP was 103/60, pulse 104, respirations were 28, and O2 sat was 100%. Clinical exam was unremarkable except for abdominal exam, which revealed a mildly distended and nontender abdomen with present bowel sounds. From a neurological standpoint, the patient was awake, alert, and oriented to name and recognizing her daughter, but unable to express the appropriate date or time, but she was able to localize that she was in the hospital.  She was following commands easily with her upper and lower extremities.  There was no apparent cyanosis or clubbing.  Her EKG demonstrated sinus tachycardia with nonspecific ST changes.  Chest x-ray showed improving perihilar and lower lobe opacities.  Her white count was 31,200, hemoglobin 16.2, a followup hemoglobin was 18, and hematocrit 48, repeat 53, and platelets 367,000.  Sodium  137, potassium 6.1, repeat 5.5; chloride 108, glucose 156, BUN 71, creatinine 2.5, alkaline phosphatase 65, AST 41, ALT 59,  and calcium 10.2.  Lactic acid 5.6.  MB 4.9 and troponin less than 0.05.  PAST MEDICAL HISTORY: 1. Acute diastolic heart failure. 2. Recent non-ST-segment elevated MI. 3. GERD. 4. Hypertension. 5. Asthmatic bronchitis. 6. Moderate-to-severe aortic stenosis with mild aortic regurgitation. 7. Mild mitral regurgitation followed by Dr. Algie Coffer. 8. Hiatal hernia. 9. Stress incontinence. 10.Chronic venous insufficiency. 11.Peripheral vascular disease. 12.Dyslipidemia. 13.Chronic low back pain and neck pain. 14.Osteoporosis. 15.Anxiety. 16.Macular degeneration.  ADMITTING DIAGNOSES: 1. Sepsis with shock, presumed secondary to C diff colitis. 2. Nausea, vomiting, and diarrhea, presumed secondary to presumed C     difficile colitis. 3. Acute renal failure. 4. Profound dehydration. 5. Recent non-ST-segment elevated MI with second set of troponins     elevated. 6. Hyperkalemia secondary to acute renal failure and dehydration. 7. Aortic stenosis. 8. Hyponatremia. 9. Radiology clarification.  DIAGNOSTICS: 1. A portable chest x-ray on February 27, 2010, which shows improving     perihilar and lower lobe opacities. 2. CT of the abdomen and pelvis with oral contrast on February 27, 2010,  that shows findings suggestive of acute mild diverticulitis     in the distal sigmoid colon with inflammatory changes.  No     perforation or pericolonic abscess, extensive diverticular disease     with regular areas of thickening throughout the descending and     sigmoid colon.  Correlation with recent colonoscopy is recommended.     The patient has not had a recent colonoscopy, then GI consultation     is recommended as malignancy cannot be excluded.  Bilateral renal     calculi. 3. Portable chest x-ray on February 28, 2010, shows appropriate     placement of PICC line,  cardiac enlargement and low lung volumes. 4. Two-view of abdominal x-ray on March 03, 2010, shows no free air,     diffuse gaseous distention of the bowel consistent with an ileus,     obstruction less likely. 5. A 2D echocardiogram from the previous admission on February 24, 2010, which shows mild concentric hypertrophy.  Systolic function     normal with an estimated ejection fraction of 60%-65%, hypokinesis     of the apical myocardium.  Doppler parameters consistent with grade     1 diastolic dysfunction.  Severe aortic stenosis with mild     regurgitation with Vmax of 0.83 cm2.  LABORATORY DATA: Urinalysis was consistent with a UTI with followup urine culture, positive for E coli, which was pansensitive, lactic acid at presentation was elevated at 5.6, gastric colonoscopy on February 27, 2010, was positive.  Followup lactic acid after hydration was down to 4.8 with procalcitonin of 2.17.  Lactic acid continued to trend down by February 28, 2010, was down to 1.4 with repeat procalcitonin of 2.02.  Cardiac markers at presentation with a normal troponin of less than 0.05.  Peak troponin of 0.47 and last checked February 28, 2010, was down to 0.35. No additional cardiac markers were ordered.  ABG on February 27, 2010, on 2 L oxygen, pH 7.406, pCO2 of 30.5, PO2 of 89.4, bicarb 18.7, pCO2 of 19.7, and base deficit 5.1.  Blood cultures from February 27, 2010, x2 showed no growth to date.  LDH on February 14, 2010, was 173 and most recent laboratory work from March 03, 2010, shows white count now down to 14,100, hemoglobin 11.5, hematocrit 35.7, platelets 224,000, and neutrophils 87%.  Sodium 139, potassium 3.8, chloride 107, CO2 of 22, glucose 93, BUN 7, creatinine 0.39, albumin 2.2, calcium 7.5, and phosphorus 1.5.  HOSPITAL COURSE: 1. Sepsis secondary to acute sigmoid diverticulitis.  The patient     presented with abdominal pain and diarrhea with shock and fever,     but was alert  and oriented.  By the time hospitalist service came     back to evaluate her within the first 24 hours of admission, she     had profoundly decompensated.  She was now lethargic.  She appeared     to be in frank shock.  Her extremities were cool.  She was quite     tachypneic and lab revealed metabolic acidosis, all consistent with     sepsis.  Aggressive fluid hydration was initiated. Since stool for     C diff.PCR was negative identification of source of sepsis was     begun.  On clinical exam, she had diffuse abdominal pain.  Concerns     initially were for ischemic bowel versus bowel perforation.  CT     scan with oral contrast was obtained  and showed acute sigmoid     diverticulitis without apparent perforation or abscess.  Aggressive     fluid resuscitation was begun.  She was continued on broad-spectrum     antibiotic coverage with Cipro, Flagyl, and vancomycin.     Eventually, blood cultures were negative.  Also, an NG tube was     placed for bowel decompression, since the patient's abdomen was     markedly distended and she had absent bowel sounds.  Since that     time, NG has been removed.  Abdomen is soft, less distended.  She     did develop issues related to bloating after removal of NG tube and     recent x-ray does show an ileus.  Therefore, she remains on a clear     liquid diet.  She continues to complain of postprandial early     satiety, so recommend slow advancement of diet with endpoint being     a low-residue diet for several weeks while the sigmoid colon     edema, and infectious process resolved.  Because there was no     indication for vancomycin, this was discontinued and she remains on     Cipro and Flagyl.  Her shock has also resolved and she is     normotensive.  Her lactic acid has returned to normal and as noted     in the laboratory values, her leukocytosis has markedly improved.     Most recent check was 14,000 on March 03, 2010. 2. E. coli urinary tract  infection.  This was pansensitive and she     remains on Cipro for this.  Her Foley is out. 3. Abdominal pain and diarrhea.  This was all secondary to acute     diverticulitis.  She still has some loose stools, but expect these     will improve as sigmoid colon inflammatory processes has resolved. 4. Acute renal failure with hyperkalemia.  This was related to sepsis,     shock, and low perfusion.  Baseline creatinine is 0.78.  The     patient's creatinine is now to normal. 5. Exacerbation of diastolic heart failure.  Over the past 24 hours,     the patient has become euvolemic and then now has third-space fluid     into the abdominal wall, intraabdominal organs, as well as into the     pulmonary space, and now has crackles on exam.  This is consistent     with volume overload due to aggressive fluid resuscitation while     septic.  Her IV fluids are now [facility] and she has been given a dose of     Lasix today with potassium. 6. Hypokalemia and hypophosphatemia.  The patient was previously     hyperkalemic at admission, received Kayexalate.  Since admission,     has had difficulties with hypokalemia.  This has been supplemented     with IV and oral route.  The patient's phosphorus also low at 1.5     yesterday.  We will repeat that stat today and if the phosphorus     remains low, she will receive phosphorus repletion today as well. 7. Heme-positive gastric ulcer secondary to stress gastritis.  With     placement of the NG tube, the patient had diluted, but frank red     blood returns.  She had no lower gastrointestinal bleeding.  I     suspect this is related to stress gastritis.  She was initially     placed on Protonix drip, later transitioned over to IV Protonix     q.12 h., and is now on p.o. Protonix.  Her hemoglobin is stable.     She has no further evidence of GI bleeding at this time period. 8. Hyperglycemia.  This is presumed related to acute illness and     steroids.  Her CBGs have  remained less than 200 for greater than 48     hours.  We have changed her CBG checks to a.c. an hour of sleep and     have continued sliding scale coverage. 9. Severe aortic stenosis.  Per recent echocardiogram, Vmax is 0.83.     Dr. Algie Coffer evaluated the patient on this admission.  He reports     that she is not a surgical candidate.  Therefore, treatment of the     acute sigmoid diverticulitis was with medical management only. 10.Gout on chronic low-dose prednisone.  The gout has remained     quiescent this admission, but because she is on chronic prednisone     and was set in sepsis with shock, she was given stress dose     steroids, which are now being tapered down to her usual home dose.  DISPOSITION: At the present time, the patient is clinically stable to transfer from the step-down unit to a telemetry floor.     Allison L. Rennis Harding, N.P.   ______________________________ Lonia Blood, M.D.    ALE/MEDQ  D:  03/04/2010  T:  03/04/2010  Job:  [id number]  Electronically Signed by Junious Silk N.P. on 03/06/2010 11:39:45 AM Electronically Signed by Lonia Blood M.D. on 03/11/2010 06:43:27 PM

## 2010-03-17 NOTE — Consult Note (Signed)
  Andrea Rubio              ACCOUNT NO.:  [id number]  MEDICAL RECORD NO.:  [id number]          PATIENT TYPE:  INP  LOCATION:  2010                         FACILITY:  [facility]  PHYSICIAN:  Mauro Kaufmann, MD         DATE OF BIRTH:  12/16/1925  DATE OF CONSULTATION: DATE OF DISCHARGE:                                CONSULTATION   REQUESTING PHYSICIAN:  Jeoffrey Massed, MD  REASON FOR CONSULTATION:  Establishing goals of care.  CHIEF COMPLAINT:  Dyspnea.  HISTORY OF PRESENT ILLNESS:  An 77 year old female with past medical of diastolic heart failure who has been on tolazamide, which was recently stopped by her primary care physician.  The patient says that after that she started to get short of breath and within 3 days, she became so short of breath that they had to bring her to the hospital.  The patient does have a history of asthma, also has a history of arthritis, though never diagnosed with rheumatoid arthritis, the patient has chronic pain though which is pretty well controlled with medications.  The patient is very functional.  She is able to walk in the house and also supervise her son.  The patient usually cooks the meals with her son and she is able to travel without any difficulty.  The patient's PPS score is 80%. During this hospitalization, the patient was found to have elevated BNP with elevated troponin as high as 1.28.  Chest x-ray showed cardiomegaly and pulmonary edema.  The patient's echocardiogram showed EF of 65% with grade 1 diastolic dysfunction and also severe aortic stenosis.  The patient has refused any surgical intervention and also has refused cardiac catheterization at this time.  The patient's dyspnea has improved after the right diuresis and currently is almost at her baseline.  At this time as per my discussion with family, they want to continue the current medical management and they do not want any aggressive interventions.  We also discussed  about the code status and at this time, the patient is DNR.  The patient plans to go home with home health PT.  ASSESSMENT: 1. Dyspnea and due to the diastolic dysfunction and asthma. 2. Osteoarthritis. 3. History of gout. 4. Aortic stenosis.  RECOMMENDATIONS: 1. The patient is DNR. 2. Continue current medical management. 3. No aggressive interventions, no cardiac catheterization, and no     surgical procedures. 4. The patient to go home with home health PT when medically stable.  Total time spent 65 minutes, More than 50% time spent in counselling and  coordination of care.    Mauro Kaufmann, MD     GL/MEDQ  D:  02/25/2010  T:  02/25/2010  Job:  [id number]  Electronically Signed by Sibyl Parr Javanni Maring  on 03/17/2010 10:53:09 AM

## 2010-05-01 ENCOUNTER — Telehealth: Payer: Self-pay | Admitting: Pulmonary Disease

## 2010-05-01 NOTE — Telephone Encounter (Signed)
Called and spoke with pt and her son---she never got the results of her labs done back in [place].  Reviewed these labs with her per SN, but explained to her that he wanted her to cont the demadex for increase fluid but pt has recently been in the hosp and nursing home, so she will need to follow her current meds now.  Pt voiced her understanding and pt has appt with SN in may and will keep this appt.

## 2010-05-06 ENCOUNTER — Telehealth: Payer: Self-pay | Admitting: Pulmonary Disease

## 2010-05-06 LAB — URINALYSIS, ROUTINE W REFLEX MICROSCOPIC
Glucose, UA: NEGATIVE mg/dL
Hgb urine dipstick: NEGATIVE
Ketones, ur: NEGATIVE mg/dL
Protein, ur: NEGATIVE mg/dL
pH: 7.5 (ref 5.0–8.0)

## 2010-05-06 LAB — COMPREHENSIVE METABOLIC PANEL
CO2: 23 mEq/L (ref 19–32)
Calcium: 9.7 mg/dL (ref 8.4–10.5)
GFR calc Af Amer: >60 mL/min (ref >60)
GFR calc non Af Amer: >60 mL/min (ref >60)
Glucose, Bld: 97 mg/dL (ref 70–99)
Potassium: 4.2 mEq/L (ref 3.5–5.1)
Sodium: 142 mEq/L (ref 135–145)
Total Bilirubin: 0.5 mg/dL (ref 0.3–1.2)
Total Protein: 6.7 g/dL (ref 6.0–8.3)

## 2010-05-06 LAB — DIFFERENTIAL
Basophils Absolute: 0 10*3/uL (ref 0.0–0.1)
Lymphocytes Relative: 12 % (ref 12–46)
Monocytes Absolute: 0.6 10*3/uL (ref 0.1–1.0)
Neutro Abs: 9.2 10*3/uL — ABNORMAL HIGH (ref 1.7–7.7)
Neutrophils Relative %: 82 % — ABNORMAL HIGH (ref 43–77)

## 2010-05-06 LAB — URINE CULTURE: Colony Count: 4000

## 2010-05-06 LAB — POCT CARDIAC MARKERS
CKMB, poc: 2.4 ng/mL (ref 1.0–8.0)
Myoglobin, poc: 345 ng/mL (ref 12–200)
Troponin i, poc: <0.05 ng/mL (ref 0.00–0.09)

## 2010-05-06 LAB — CBC
Hemoglobin: 13.7 g/dL (ref 12.0–15.0)
MCHC: 33.3 g/dL (ref 30.0–36.0)
RBC: 4.44 MIL/uL (ref 3.87–5.11)
RDW: 15.4 % (ref 11.5–15.5)

## 2010-05-06 NOTE — Telephone Encounter (Signed)
[id number]

## 2010-05-07 NOTE — Telephone Encounter (Signed)
Alinda Money calling to notify SN that he will be seeing this patient for physical therapy: 3 times weekly for 4 weeks, then w times weekly for 2 weeks. SN should received hardcopy from [place] stating this.

## 2010-05-07 NOTE — Telephone Encounter (Signed)
Ok thanks  Call if additional orders needed.

## 2010-05-09 LAB — BASIC METABOLIC PANEL
BUN: 14 mg/dL (ref 6–23)
BUN: 19 mg/dL (ref 6–23)
CO2: 22 mEq/L (ref 19–32)
Calcium: 8 mg/dL — ABNORMAL LOW (ref 8.4–10.5)
Calcium: 8.7 mg/dL (ref 8.4–10.5)
Chloride: 113 mEq/L — ABNORMAL HIGH (ref 96–112)
GFR calc Af Amer: >60 mL/min (ref >60)
GFR calc non Af Amer: 56 mL/min — ABNORMAL LOW (ref >60)
GFR calc non Af Amer: >60 mL/min (ref >60)
GFR calc non Af Amer: >60 mL/min (ref >60)
Glucose, Bld: 109 mg/dL — ABNORMAL HIGH (ref 70–99)
Glucose, Bld: 138 mg/dL — ABNORMAL HIGH (ref 70–99)
Potassium: 3.4 mEq/L — ABNORMAL LOW (ref 3.5–5.1)
Potassium: 3.7 mEq/L (ref 3.5–5.1)
Potassium: 4.4 mEq/L (ref 3.5–5.1)
Sodium: 138 mEq/L (ref 135–145)
Sodium: 140 mEq/L (ref 135–145)
Sodium: 142 mEq/L (ref 135–145)

## 2010-05-09 LAB — DIFFERENTIAL
Eosinophils Relative: 0 % (ref 0–5)
Eosinophils Relative: 0 % (ref 0–5)
Lymphocytes Relative: 10 % — ABNORMAL LOW (ref 12–46)
Lymphocytes Relative: 16 % (ref 12–46)
Lymphs Abs: 1.3 10*3/uL (ref 0.7–4.0)
Lymphs Abs: 1.4 10*3/uL (ref 0.7–4.0)
Monocytes Relative: 6 % (ref 3–12)
Neutro Abs: 11.2 10*3/uL — ABNORMAL HIGH (ref 1.7–7.7)

## 2010-05-09 LAB — CBC
HCT: 36.4 % (ref 36.0–46.0)
HCT: 37.4 % (ref 36.0–46.0)
HCT: 40.3 % (ref 36.0–46.0)
Hemoglobin: 11.9 g/dL — ABNORMAL LOW (ref 12.0–15.0)
Hemoglobin: 12.4 g/dL (ref 12.0–15.0)
Hemoglobin: 13.3 g/dL (ref 12.0–15.0)
MCV: 93.5 fL (ref 78.0–100.0)
Platelets: 177 10*3/uL (ref 150–400)
Platelets: 188 10*3/uL (ref 150–400)
RBC: 3.88 MIL/uL (ref 3.87–5.11)
RDW: 13.8 % (ref 11.5–15.5)
RDW: 14.2 % (ref 11.5–15.5)
WBC: 10.9 10*3/uL — ABNORMAL HIGH (ref 4.0–10.5)
WBC: 13.4 10*3/uL — ABNORMAL HIGH (ref 4.0–10.5)

## 2010-05-09 LAB — URINE MICROSCOPIC-ADD ON

## 2010-05-09 LAB — CULTURE, BLOOD (ROUTINE X 2): Culture: NO GROWTH

## 2010-05-09 LAB — URINALYSIS, ROUTINE W REFLEX MICROSCOPIC
Bilirubin Urine: NEGATIVE
Glucose, UA: NEGATIVE mg/dL
Specific Gravity, Urine: 1.013 (ref 1.005–1.030)
pH: 7 (ref 5.0–8.0)

## 2010-05-09 LAB — URIC ACID: Uric Acid, Serum: 3.4 mg/dL (ref 2.4–7.0)

## 2010-05-09 LAB — SEDIMENTATION RATE: Sed Rate: 52 mm/hr — ABNORMAL HIGH (ref 0–22)

## 2010-05-09 LAB — CK TOTAL AND CKMB (NOT AT ARMC): CK, MB: 1.9 ng/mL (ref 0.3–4.0)

## 2010-06-03 ENCOUNTER — Other Ambulatory Visit: Payer: Self-pay | Admitting: Dermatology

## 2010-06-06 ENCOUNTER — Other Ambulatory Visit: Payer: Self-pay | Admitting: Pulmonary Disease

## 2010-06-17 ENCOUNTER — Other Ambulatory Visit: Payer: Self-pay | Admitting: Pulmonary Disease

## 2010-06-17 NOTE — Discharge Summary (Signed)
Andrea Rubio, Andrea Rubio              ACCOUNT NO.:  [id number]   MEDICAL RECORD NO.:  [id number]          PATIENT TYPE:  INP   LOCATION:  1413                         FACILITY:  [facility]   PHYSICIAN:  Lonzo Cloud. Kriste Basque, MD     DATE OF BIRTH:  1925/11/17   DATE OF ADMISSION:  01/19/2007  DATE OF DISCHARGE:  01/31/2007                               DISCHARGE SUMMARY   FINAL DIAGNOSIS:  1. Admitted January 19, 2007 by the emergency room with acute      asthmatic bronchitis and bilateral lower lobe pneumonia - no      organism specified.  She was treated with broad-spectrum      antibiotics including IV Rocephin and Zithromax for community-      acquired pneumonia and improved.  2. Respiratory insufficiency, hypotension, and confusion requiring a      brief stay in intensive care unit.  The patient responded to      antibiotics and fluid resuscitation.  3. Hypertension - controlled on medications.  4. Arteriosclerotic peripheral vascular disease with decreased ankle      brachial indices.  5. Venous insufficiency with peripheral edema.  6. Hypercholesterolemia - controlled on diet and Pravachol.  7. Hiatus hernia and gastroesophageal reflux disease with history of      esophageal stricture.  8. History of diverticulosis.  9. History of ischemic colitis and gastrointestinal bleed in 2006.  10.History of urinary tract infections and urinary stress      incontinence.  11.History of degenerative arthritis and gout.  12.History of low back pain with previous lumbar laminectomy.  13.History of previous bilateral shoulder surgeries.  14.History of osteoporosis.  15.History of anxiety.  16.Anemia.  17.Allergy to allopurinol with rash.   BRIEF HISTORY AND PHYSICAL:  The patient is a 81-year white female with  multiple medical problems as noted above.  She presented to the  emergency room on January 19, 2007 with a 3-day history of cough  productive of small amount of thick yellow sputum without  hemoptysis.  She had low grade fever with chills and sweats.  She noted diffuse chest  discomfort with her cough and progressive dyspnea with evaluation by the  emergency room physician revealing bilateral lower lobe infiltrates and  a white count of 15,000.  She was referred for admission due to  pneumonia.   PAST MEDICAL HISTORY:  Past history includes a frail 77 year old lady  last seen in the office in October 2008 and last hospitalized in 2006  with a GI bleed secondary to ischemic colitis.  She has had a history of  failure to thrive and a generalized geriatric decline with 3 separate  admissions in 2006 and finally went to the nursing home.  She has been  back at the home with her family and home health support.  She has a  history of asthmatic bronchitis and takes 5 mg prednisone a day.  She  has a history of hypertension controlled on Avapro and verapamil.  She  has a history of arteriosclerotic peripheral vascular disease with  decreased ABIs.  She has a history of venous  insufficiency with  peripheral edema.  She has a history of hypercholesterolemia and  controls this on diet alone, previously she had been on Pravachol.  She  has a history of hiatus hernia and gastroesophageal reflux disease with  a stricture dilated in 2006 by Dr. Russella Dar.  She has a history of  diverticulosis and the above-mentioned episode of ischemic colitis in  2006.  She has a history of urinary tract infections and urinary stress  incontinence.  She has a history of degenerative arthritis and gout.  She has had previous lumbar laminectomy, bilateral shoulder  replacements, and a recent evaluation for back pain by Dr. Otelia Sergeant.  She  is allergic to allopurinol, which caused a rash.  She has osteoporosis.  She has moderate anxiety.  She has had a mild anemia.   PHYSICAL EXAMINATION:  GENERAL:  At the time of admission revealed an  elderly white female in mild distress.  VITAL SIGNS:  Blood pressure 80/40,  pulse 90 per minute and regular,  respirations 24 per minute not labored, temperature 102 degrees orally.  HEENT:  Negative, except for dry mucous membranes.  NECK: Showed jugular venous distention with the patient lying flat.  No  carotid bruits.  CHEST:  Revealed bilateral lower lobe rhonchi and rales, no signs of  consolidation.  CARDIAC:  Revealed a regular rhythm, grade 1/6 systolic ejection murmur  at the left sternal border without rubs or gallops heard.  ABDOMEN:  Distended, but soft and nontender without evidence  organomegaly or masses.  Bowel sounds were intact.  EXTREMITIES:  Showed some venous insufficiency, trace edema.  She had  thin skin with skin tear on her left leg.  NEUROLOGIC:  Revealed her to be awake, alert and cooperative.  She was  mentating slightly slower than baseline, but oriented x3.  There were no  focal abnormalities detected.   LABORATORY DATA:  EKG showed normal sinus rhythm and nonspecific ST-T  wave changes.   2-D echo on January 20, 2007 showed overall normal left ventricular  systolic function with ejection fraction estimated to be 65%; there were  no regional wall motion abnormalities seen; the aortic valve was  moderately thickened with some reduced leaflet excursion consistent with  mild to moderate aortic stenosis, and mild aortic regurgitation as well.  There were mild fibrocalcific changes in the aortic root and the mitral  annulus.   Chest x-ray revealed bilateral lower lobe infiltrates and atelectasis.  Serial films showed persistence of the opacities and small effusions.  Later the infiltrates were resolving.   Hemoglobin 10.7, hematocrit 31.3, white count 15,100 with a left shift.  Protime 17.1, INR 1.4, PTT 46 seconds.  Sodium 138, potassium 5.1,  chloride 103, CO2 26, BUN 36, creatinine 1.6, blood sugar 109, calcium  8.3, total protein 4.8, albumin 2.4, AST 54, ALT 27, alk phos 40, total  bilirubin 0.9, magnesium 2.2, phosphorus  4.1, amylase 430 and lipase 13,  lactic acid 1.5, CPK elevated at 2000 with negative MB and troponin of  0.19, TSH 1.12, serum iron 30, TIBC 150 for saturation of 20%,  B12  1012, ferritin elevated at 1800, urinalysis clear.  Blood type A+. Blood  cultures negative x2.  Urine culture negative and urine negative for  Legionella and strep antigens.   HOSPITAL COURSE:  The patient was admitted with community-acquired  pneumonia and due to her hypotension was admitted to the ICU for  intensive management.  She was placed on Rocephin and Zithromax  intravenously and  given fluid resuscitation.  With her elevated CPK and  troponin levels.  She was felt to have a possible non-ST elevation MI  and was treated accordingly.  She was given BiPAP to aid in respiration  and we were able to avoid intubation.  Her blood pressure improved.  She  stabilized on this regimen and gradually improved, was transferred out  of ICU on January 25, 2007.  At that point we were able to gently  diurese her and maintain her blood pressure.  Her BNPs improved and  fluid status stabilized.  She was seen by physical therapy, occupational  therapy and the care management team.  Decision was made to pursue home  care with home health and maximal DME support.  Arrangements were made  for physical therapy, occupational therapy, social worker and an Charity fundraiser to  come to the home.  She is felt to be MHB and ready for discharge on  January 31, 2007.   MEDICATIONS AT DISCHARGE:  1. Avapro 300 mg p.o. daily.  2. Verapamil 240 mg p.o. daily.  3. Demadex 20 mg p.o. q.a.m.Marland Kitchen  4. K 20 1 tablet daily.  5. Prednisone 5 mg tablets 2 tablets each a.m..  6. Prilosec 20 mg p.o. daily.  7. Singulair 10 mg p.o. daily.  8. Enteric coated aspirin 81 mg p.o. daily.  9. Advair 250/51 1 inhalation twice a day.  10.Mobic 7.5 mg p.o. daily as needed for arthritis pain.  11.Clonazepam 1 mg p.o. q.h.s. as needed for sleep.  12.Tylenol 2 tablets  every 4-6 hours as needed.   CONDITION ON DISCHARGE:  Improved.   DISPOSITION:  Patient being discharged with maximum home health support  and will call the office for followup appointment 2 weeks.   CONDITION ON DISCHARGE:  Improved.      Lonzo Cloud. Kriste Basque, MD  Electronically Signed     SMN/MEDQ  D:  03/24/2007  T:  03/25/2007  Job:  [id number]

## 2010-06-17 NOTE — Discharge Summary (Signed)
[name], Andrea Rubio              ACCOUNT NO.:  [id number]   MEDICAL RECORD NO.:  [id number]          PATIENT TYPE:  INP   LOCATION:  1433                         FACILITY:  [facility]   PHYSICIAN:  Lonzo Cloud. Kriste Basque, MD     DATE OF BIRTH:  December 20, 1925   DATE OF ADMISSION:  09/05/2007  DATE OF DISCHARGE:  09/10/2007                               DISCHARGE SUMMARY   FINAL DIAGNOSES:  1. Admitted September 05, 2007 with a 2-3 day history of upper respiratory      infection, cough and fever to 104 degrees.  Evaluation revealed      development of bilateral lower lobe infiltrates compatible with      pneumonia - no organism specified.  The patient responded to broad-      spectrum antibiotic coverage, intravenous Solu-Medrol, inhaled      bronchodilators and oxygen.  2. History of asthmatic bronchitis.  3. History of hypertension - controlled on medications.  4. History of congestive heart failure with mild aortic      stenosis/aortic insufficiency on previous 2D echo.  5. Arteriosclerotic peripheral vascular disease.  6. Venous insufficiency.  7. Hyperlipidemia.  8. Hiatus hernia and gastroesophageal reflux disease.  9. Diverticulosis.  10.History of urinary stress incontinence.  11.Degenerative arthritis and low back pain.  12.Osteoporosis on Fosamax.  13.History of anxiety.  14.History of anemia.   BRIEF HISTORY AND PHYSICAL:  An 77 year old white female known to me  with multiple problems as noted above.  She presented to emergency room  on September 05, 2007 with a temperature of 104 degrees.  Evaluation  revealed a 2-3 day history of upper respiratory tract infection with  cough, fever and progressive weakness.  She was unable to care for  herself at home and came to the ER for evaluation.  In the ER her chest  x-ray showed faint bilateral lower lobe infiltrates, normal white count  and serum chemistries, and she was admitted with concern for pneumonia  and put on respiratory isolation  with H1N1 swab sent (eventually  returned negative).   PAST MEDICAL HISTORY:  As noted she has a history of asthmatic  bronchitis and is controlled at home on prednisone, Advair and Xopenex  nebulizer treatments.  She has hypertension controlled on verapamil and  Avapro and Demadex with potassium.  She has a history of congestive  heart failure with 2D echo in 2006 showing mild AS/AI and moderate  increased aortic valve thickness and reduced leaflet excursion.  Her  ejection fraction at that time was 55% - 65%.  She has known  arteriosclerotic peripheral vascular disease with decreased ABIs  measuring 0.88 on the right and 0.95 on the left.  She has venous  insufficiency and follows a low-sodium diet and takes the Demadex.  She  has hypercholesterolemia which was previously controlled on Pravachol,  she currently takes fish oil supplements only per her ophthalmologist.  She has a history of hiatus hernia and gastroesophageal reflux disease.  She has a history of diverticulosis, her last colonoscopy in 2006 showed  diverticula and a suspicion for ischemic colitis.  She  has known urinary  stress incontinence and has seen Urology in the past.  She has  degenerative arthritis and low back pain.  She has osteoporosis and  takes Fosamax, calcium and vitamins.  She has a history of mild anemia,  required 2 unit transfusion during a 2006 hospitalization for ischemic  colitis.   PHYSICAL EXAMINATION:  Physical examination at time of admission  revealed an elderly white female in mild distress with dyspnea.  Blood  pressure 150/80, pulse 100 and regular, respirations 24 per minute  slightly shallow, temperature 104 degrees rectally.  HEENT:  Unremarkable.  She has slightly dry mucous membranes.  CHEST:  Revealed decreased in breath sounds bilaterally, a few basilar  rales and rhonchi, no signs of consolidation.  CARDIAC:  Revealed a grade 2/6 systolic murmur left sternal border.  No  rubs or  gallops heard.  ABDOMEN:  Soft, nontender without evidence of organomegaly or masses.  EXTREMITIES:  Showed degenerative arthritis, venous insufficiency, trace  edema and intact pulses.  NEUROLOGIC:  Intact without focal abnormalities detected.  SKIN:  Showed some ecchymoses, otherwise negative.   LABORATORY DATA:  Chest X-Ray:  Revealed bilateral humeral head  prostheses and she fluffed out bibasilar opacities greater on the  right, compatible with infiltrates and atelectasis.  CBC showed  hemoglobin 14.5, hematocrit 43.7, white count 6400 with 87% segs.  Chemistry studies showed a sodium 146, potassium 3.9, chloride 109, CO2  24, BUN 25, creatinine 0.9, blood sugar 92, calcium 9, total bilirubin  0.9, alk phos 35, SGOT 65, SGPT 28, total protein 5, albumin 2.5,  calcium 7.5, CPK 400 with negative MB and negative troponin.  Legionella  urinary antigen was negative.  Blood cultures grew 1 out of 2 positive  for Staph species, coag negative, believed to be a contaminant.  Legionella urinary antigen negative, BNP 100, H1N1 one screen negative,  no virus detected.   HOSPITAL COURSE:  She was admitted with an exacerbation of her asthmatic  bronchitis and bilateral lower lobe pneumonia.  Cultures were  unrevealing.  She was covered with broad-spectrum antibiotics including  IV Avelox and Rocephin from the admission.  She was given IV Solu-Medrol  and inhaled bronchodilators.  She was also placed on Tamiflu.  She  responded to this regimen with a decrease in her temperature very  quickly.  She started feeling better and was seen by physical therapy  and occupational therapy for mobility.  Her follow-up chest x-ray showed  the bilateral lower lobe infiltrates compatible with a community-  acquired pneumonia.  As she improved, her IV medicines were discontinued  in favor of oral Avelox.  When the negative H1N1 returned the Tamiflu  was discontinued.  Her IV Solu-Medrol was weaned back to  oral  prednisone.   The patient was seen by physical therapy, occupational therapy who felt  that Home Health PT and OT was indicated.  The patient wished to be  discharged home in the care her family as opposed to going to a nursing  home for rehab purposes.  She felt that she was safe for ambulation and  could participate in her self-care after improvement with the hospital  therapy.   MEDICATIONS AT DISCHARGE:  1. Prednisone 5 mg p.o. q.a.m.  2. Advair 250/50 one puff b.i.d.  3. Xopenex nebulizer treatments 1.25 mg t.i.d.  4. Avelox 400 mg p.o. daily for five more days till gone.  5. Avapro 300 mg tablets 1/2 tablet p.o. daily.  6. Verapamil 240 mg  tablets one tablet a day.  7. Torsemide 20 mg one p.o. q.a.m.  8. K 21 tablet daily.  9. Omeprazole 20 mg p.o. daily.  10.Reglan 10 mg p.o. nightly.  11.Fosamax 70 mg p.o. q. week.  12.Amitriptyline 25 mg p.o. nightly.  13.Singulair 10 mg p.o. daily.  14.She was also instructed to continue her calcium, vitamins and      glucosamine supplements.   DISPOSITION:  Patient being discharged home with visiting nurses, Home  Health, PT and OT.  She will return to see me on Thursday August 13 at  12 noon with a chest x-ray.   CONDITION ON DISCHARGE:  Improved.      Lonzo Cloud. Kriste Basque, MD  Electronically Signed     SMN/MEDQ  D:  09/10/2007  T:  09/10/2007  Job:  [id number]

## 2010-06-17 NOTE — H&P (Signed)
NAMEMAYCE, NOYES              ACCOUNT NO.:  [id number]   MEDICAL RECORD NO.:  [id number]          PATIENT TYPE:  EMS   LOCATION:  ED                           FACILITY:  [facility]   PHYSICIAN:  Lonzo Cloud. Kriste Basque, MD     DATE OF BIRTH:  1925-03-25   DATE OF ADMISSION:  09/05/2007  DATE OF DISCHARGE:                              HISTORY & PHYSICAL   CHIEF COMPLAINT:  Shortness of breath, fever, and weakness.   HISTORY OF PRESENT ILLNESS:  This is a very pleasant 77 year old female  who states she was in her usual state of health until September 03, 2007, at  which time she began to notice slow development of increased nasal  congestion, nasal drip, and cough associated with fever, and hot  flashes.  On September 04, 2007, she noted increasing frequency of cough,  sputum was noted to be more thick, and more productive.  She was unable  to qualify the color secondary to macular degeneration, and poor visual  acuity, however states he sputum was definitely more frequent and thick.  She continued to report fever, she began to notice exertional dyspnea,  particularly when getting up and ambulating around the house,  additionally had marked increase in fatigue and weakness.  This  progressed over the course of yesterday on September 04, 2007, then this  morning on September 05, 2007, she was so fatigued, and short of breath she  was unable to get out of bed.  Because of this, her son who lives with  her called EMS, and she was brought to the Emergency Room at [facility].  Upon pulmonary evaluation she denies chest pain, lower  extremity swelling or pain.  She has had some exertional wheezing.  She  did denies sick exposure, denies nausea, vomiting, reports her appetite  has been in the usual state up until today, and has had no unusual  travel or exposures.   PAST MEDICAL HISTORY:  1. Hypertension  2. Arteriosclerotic peripheral vascular disease.  3. Venous insufficiency.  4. Hyperlipidemia.  5. Hiatal hernia.  6. Gastroesophageal reflux disease.  7. History of esophageal stricture.  8. History of ischemic colitis.  9. History of GI bleed.  10.History of urinary tract infections, as well as stress      incontinence.  11.History of degenerative arthritis.  12.Gout.  13.Prior lumbar laminectomy.  14.Chronic back pain.  15.Chronic neck pain.  16.Osteoporosis.  17.Anxiety.  18.Anemia.  19.Macular degeneration.   FAMILY HISTORY:  Not applicable.   SOCIAL HISTORY:  Retired, never smoked, she is a widower, currently  lives with son.  She has assistance with ADLs secondary to visual  deficits.   ALLERGIES:  CONSIST OF ALLOPURINOL, CODEINE, CRESTOR, ADHESIVE TAPE,  MORPHINE, AND NITROFURANTOIN.   HOME MEDICATIONS:  As follows:  1. Amitriptyline 25 mg p.o. nightly.  2. Prilosec 20 mg once daily.  3. Verapamil 240 mg daily.  4. Avapro 300 mg daily.  5. Potassium chloride 20 mEq daily.  6. Prednisone 5 mg daily.  7. Torsemide 20 mg every Monday.  8. Fosamax 70 mg daily.  9. Reglan p.r.n.  10.Singulair 10 mg daily.   REVIEW OF SYSTEMS:  Per HPI.   PHYSICAL EXAMINATION:  Temperature 104.6 rectally.  Pulse rate 105-110  sinus rhythm, respirations 20s, blood pressure 150s/70s, saturations 95-  98% on 2 liters nasal cannula.  This is a ill/toxic appearing 77 year old female currently with  generalized weakness lying in the bed in the emergency room.  HEENT:  She is normocephalic.  She does have blotchy erythematic  markings, particularly on her face, and neck.  She has no JVD.  Her  posterior oropharynx is ischemic.  Both nares are erythematic, sclerae  are nonicteric.  She complains of mild adenopathy to palpation of the anterior and  posterior cervical lymph nodes.  Breath sounds are notable for occasional rhonchi.  These clear with  cough.  She has fine crackles in both bases.  CARDIAC:  Notable for a 3/6 systolic murmur, best heard in the left  heart border.  The  abdomen is soft, nontender and without organomegaly.  The bowel  sounds are present.  GU: She has a Foley catheter in place.  There is clear yellow urine  draining.  EXTREMITIES:  No significant edema, there are 2+ pulses.  Extremities  are warm to palpation with brisk cap refill.  SKIN:  Notable for erythematic blotchy rash, particularly over the face  and neck, she has scattered areas of ecchymosis over upper and lower  extremities.  NEUROLOGICALLY:  Grossly intact.   LABORATORY DATA:  White blood cell count 6.4, hemoglobin 14.5,  hematocrit 43.7, platelet count 209, neutrophils 87% lymphocytes, 4%  eosinophil 6%.  Sodium 146, potassium 3.9, chloride 109, CO2 of 24, BUN  25, creatinine 0.92, glucose 92.  Chest x-ray demonstrating question of  volume loss in the left lower lobe, otherwise no clear infiltrate.  The  film is rotated.   IMPRESSION:  1. Acute shortness of breath secondary to purulent bronchitis,      plus/minus community-acquired pneumonia.  We need to rule out      influenza versus community-acquired pneumonia in this setting.      Initially chest x-ray is without clear infiltrate however, I am      worried that this could evolve over the next 24 hours.  The plan at      this point will be to admit to the telemetry ward. Obtain sputum      culture, blood culture, influenza swab. We will empirically treat      via CMS guidelines with Avelox, and Rocephin.  Additionally, we      will empirically place on Tamiflu pending age H1N1 results.  We      will place on respiratory/droplet isolation.  Admit to the regular      ward.  Provide pulmonary hygiene in the form of scheduled      bronchodilators, and supplemental oxygen.  2. Systemic inflammatory response syndrome/sepsis secondary to problem      #1.  The plan at this point, no evidence of organ hypoperfusion, or      severe sepsis at this point.  We will check lactic acid fluid      evidence of occult organ  hypoperfusion, and administer antibiotics      as mentioned above, administer IV fluids, and watch closely in the      inpatient setting.  3. History of gastroesophageal reflux disease and hiatal hernia.  Plan      for this is to place on proton-pump-inhibitors.  4. History of chronic  pain in the setting of degenerative arthritis,      gout, and osteoporosis.  Plan for this is to provide p.r.n.      Tylenol.  5. History of hypertension.  Plan for this is to continue current home      medications.  6. Best practice.  We will place on subcu heparin for deep vein      thrombosis prophylaxis, we will check CBG is q.6h. and initiate      sliding-scale insulin if indicated, additionally will continue PPI      administration.      Zenia Resides, NP      Lonzo Cloud. Kriste Basque, MD  Electronically Signed    PB/MEDQ  D:  09/05/2007  T:  09/05/2007  Job:  [id number]

## 2010-06-18 ENCOUNTER — Encounter: Payer: Self-pay | Admitting: Pulmonary Disease

## 2010-06-19 ENCOUNTER — Other Ambulatory Visit: Payer: Self-pay | Admitting: *Deleted

## 2010-06-19 MED ORDER — METOCLOPRAMIDE HCL 10 MG PO TABS: 10.0000 mg | ORAL_TABLET | Freq: Every day | ORAL | Status: DC

## 2010-06-20 NOTE — H&P (Signed)
NAMEMARVELLE, Andrea Rubio              ACCOUNT NO.:  [id number]   MEDICAL RECORD NO.:  [id number]          PATIENT TYPE:  INP   LOCATION:  6737                         FACILITY:  [facility]   PHYSICIAN:  Lonzo Cloud. Kriste Basque, M.D. [facility] OF BIRTH:  04-05-1925   DATE OF ADMISSION:  07/04/2004  DATE OF DISCHARGE:                                HISTORY & PHYSICAL   CHIEF COMPLAINT:  Weak and dizzy x4 days.   HISTORY OF PRESENT ILLNESS:  This is a 77 year old white female patient of  Dr. Jodelle Green with multiple medical problems including steroid dependent  asthmatic bronchitis, hypertension, hyperlipidemia and arteriosclerotic  peripheral vascular disease who presented to the office yesterday with  complaint of feeling dizzy, weak and run down over the last 4 days.  Complained of increased light headedness with standing. Labs done yesterday  revealed a hemoglobin of 7.4. And on exam today she does have  guaiac  positive stools. The patient is on Prednisone 5 mg and also Relafen twice  daily for arthritis. The patient denies any chest pain, increased shortness  of breath, bloody stools, abdominal pain, nausea, vomiting.   PAST MEDICAL HISTORY:  1.  Asthmatic bronchitis. FEV1 of 0.96 liters which was 65% of the      predicted. Mid flows were reduced at 41% of the predicted normal.  2.  Hypertension.  3.  Arteriosclerotic peripheral vascular disease with decreased ankle      brachial indices.  4.  Venous insufficiency with edema.  5.  Hyperlipidemia.  6.  Hiatal hernia and gastroesophageal reflux.  7.  Recent hospitalization in March of this year. The patient had an      esophageal stricture and had a subsequent endoscopy with dilatation by      Dr. Russella Dar.  8.  Diverticulosis.  9.  Urinary stress incontinence.  10. Degenerative arthritis with previous lumbar laminectomy and bilateral      shoulder surgery.  11. Osteoporosis.  12. Hyperuricemia and gout.  13. Anxiety.  14. Congestive heart failure  with a 2-D echocardiogram in March revealing a      normal left ventricular systolic function and an ejection fraction of 55      to 65%, moderate mitral regurgitation.  15. Anemia.   CURRENT MEDICATIONS:  1.  Verapamil 240 mg daily.  2.  Avapro 300 mg daily.  3.  Labetalol 200 mg b.i.d.  4.  Torsemide 20 mg daily.  5.  K-Dur 20 mEq daily.  6.  Prednisone 5 mg daily.  7.  Prilosec 20 mg daily.  8.  Reglan 10 mg q.h.s.  9.  Amitriptyline 25 mg q.h.s.  10. Relafen 500 mg b.i.d.  11. Fosamax 70 mg q. weekly.  12. Xopenex nebulizer 1.25 mg per hand held nebulizer t.i.d.   ALLERGIES:  CODEINE, ALLOPURINOL AND MORPHINE.   SOCIAL HISTORY:  The patient is a widow with three children. She lives with  her son. She is retired and has never been a smoker.   FAMILY HISTORY:  Noncontributory.   REVIEW OF SYSTEMS:  The patient has multiple chronic illnesses. She  complains of  intermittent headaches. Weakness and multiple arthritic  complaints.   PHYSICAL EXAMINATION:  GENERAL:  The patient is a frail, elderly white  female in no acute distress.  VITAL SIGNS:  Temperature 98.2, blood pressure 112/64, pulse is regular at  76, O2 saturation 96% on room air. Weight is 137 pounds which is down 7  pounds.  HEENT:  Pupils are equal, round and reactive to light. Normocephalic,  atraumatic. Posterior pharynx is clear. Tympanic membranes are normal.  Conjunctivae pale.  NECK:  Supple without cervical adenopathy. No JVD. Carotids are equal with  positive upstrokes bilaterally without any bruits.  LUNGS:  Reveal diminished breath sounds at the bases, otherwise clear.  CARDIAC:  Regular rate and rhythm with a grade 1/6 systolic murmur.  ABDOMEN:  Soft with positive bowel sounds throughout all four quadrants. No  guarding or rebound, no hepatosplenomegaly.  RECTAL EXAM:  Reveals a positive occult stool.  EXTREMITIES:  Are warm without clubbing or cyanosis, there is trace to 1+  edema with venous  insufficiency changes. Patient has significant arthritic  changes along the hands.   DATA:  White blood cell count is 20,900, hemoglobin is 7.4, hematocrit 22.9,  sed rate 121. Potassium 5.7, BUN and creatinine 53 and 1.5. Iron level 14.  BNP 126. Urinalysis with moderate leukocytes.   IMPRESSION/PLAN:  1.  Severe anemia, probable gastrointestinal bleed secondary to      nonsteroidals and Prednisone.  2.  Will hold Fosamax and Relafen. A GI consult has been obtained. Amy      Monica Becton has evaluated this patient. She will be admitted for blood      transfusion including two units of packed red blood cells.   Labs are pending including KUB.     TP/MEDQ  D:  07/04/2004  T:  07/05/2004  Job:  [id number]

## 2010-06-20 NOTE — Op Note (Signed)
Andrea Rubio, Andrea Rubio                        ACCOUNT NO.:  [id number]   MEDICAL RECORD NO.:  [id number]                   PATIENT TYPE:  INP   LOCATION:  0011                                 FACILITY:  [facility]   PHYSICIAN:  Kerrin Champagne, M.D.                DATE OF BIRTH:  02-23-1925   DATE OF PROCEDURE:  01/15/2003  DATE OF DISCHARGE:                                 OPERATIVE REPORT   PREOPERATIVE DIAGNOSIS:  Left lateral recess stenosis, L4-5, secondary to  synovial cyst on the lateral recess, medial border of the facet, and  hypertrophic change involving the left L4-5 facet.  Mild left L3-4 lateral  recess stenosis also with an early synovial cyst forming here.   POSTOPERATIVE DIAGNOSIS:  Left lateral recess stenosis, L4-5, secondary to  synovial cyst on the lateral recess, medial border of the facet, and  hypertrophic change involving the left L4-5 facet.  Mild left L3-4 lateral  recess stenosis also with an early synovial cyst forming here.   PROCEDURE:  1. Left L4 hemilaminectomy.  2. Excision of left L4-5 medial facet synovial cyst impinging on the left     side of the thecal sac.  3. Left L3-4 lateral recess decompression.  4. Repair of dural leak secondary to synovial cyst erosion into the left L4     lateral portion of thecal sac.   SURGEON:  Kerrin Champagne, M.D.   ASSISTANT:  Wende Neighbors, P.A.   ANESTHESIA:  GOT, Hezzie Bump. Rose, M.D.   ESTIMATED BLOOD LOSS:  75 mL.   DRAINS:  None.   BRIEF CLINICAL HISTORY:  The patient is a 77 year old female with gradually  progressing worsening neurogenic claudication secondary to lateral recess  stenosis, quite severe, along the left side of L4-5 secondary to a  spondylolisthesis; however, a large synovial cyst involving the medial  aspect of the facet appears to be causing more significant nerve compromise  than any longstanding degenerative spondylolisthesis present here and at L5-  S1.  The patient has undergone  attempts at selective injection and injection  of the facet without any relief of pain.  She is brought to the operating  room to undergo left lateral recess decompression at the left L4-5 and L3-4  levels and a very mild synovial cyst noted at L3-4 with early lateral recess  stenosis at this segment.   INTRAOPERATIVE FINDINGS:  A very large synovial cyst impressing upon the  left side of the thecal sac causing compression of nearly 50% of the left  lateral recess at L4-5.  Affecting the left L5 nerve root.  This synovial  cyst eroded into the thecal sac.  Removal of the cyst demonstrating very  small punctate areas of opening through the arachnoid layer; however, the  patient's outer pia mater appeared already to have erosive changes and  opening.  This was repaired using a 4-0 nylon suture.  DESCRIPTION OF PROCEDURE:  After adequate general anesthesia, the patient in  knee-chest position, Andrews frame, standard preoperative antibiotics,  standard prep with Duraprep solution in the usual manner.  Iodine Vi-Drape  was used.  An incision at expected L3 and L4 levels through the skin and  subcutaneous layers in the midline after infiltration with Marcaine 0.5% and  1:200,000 epinephrine.  Incision carried sharply down to the spinous  processes of L3, L4.  Left-side attachments of the lumbodorsal fascia  incised off of the left side of the spinous process of L3 and L4, a clamp  placed over the spinous process of L3 and L4, intraoperative lateral  radiograph demonstrating the clamps on the L3 and L4 spinous process.  Cobbs  then used to carefully elevate the paralumbar muscles on the left side at L4  and L5, the McCullough retractor inserted.  Leksell rongeur used to remove  approximately 50% of the inferior aspect of the lamina on the left side at  the L4 level up to the insertion of the ligamentum flavum, 3 mm Kerrisons  then used to resect further, the superior aspect of the lamina on  the left  side at L4 up to the L3-4 interspace.  The ligamentum flavum here resected  and the Leksell rongeur used to remove a small portion of the inferior  aspect of the lamina on the left side at L3.  Ligamentum flavum resected  along the medial border of the L3-4 facet, decompressing the left L3-4  lateral recess and the L4 nerve root here.  Decompression carried up to the  insertion of the ligamentum flavum and the undersurface of the lamina of L3.  The operating room microscope then draped brought into the field sterilely.  Under the operating room microscope then the lateral recess on the left side  at L4-5 was decompressed, removing approximately 30% of the medial facet in  the left side, the inferior articular process of L4, superior articular  process of L5.  The spinal canal entered over the superior aspect of the  lamina of L5 and a foraminotomy performed over the left L5 nerve root.  This  then could carry out the lateral recess on the left side, resecting the  medial border of the facet here and freeing up a very large synovial cyst.  Beginning at the superior aspect of the synovial cyst using Penfield 4, then  synovial cyst carefully freed up off of the thecal sac.  It was then  resected bluntly using pituitary rongeur under careful examination using  microscope.  The cyst had been excised as well as ligamentum flavum.  Notable dural defect was noted along the left side.  The arachnoid layer  appeared to be scarred beneath this but intact with very small areas of  punctate CSF leakage through here.  Some areas of fronds of synovial tissue  were present, and these were resected using a fine pituitary rongeur, micro-  pituitary rongeur.  Care taken not to enter the sac.  The nerve hook used to  further free the small fronds of apparent synovial tissue here. Once this  was resected, then it was felt that the thecal sac should be reapproximated and the opening into the arachnoid  area imbricated in order to prevent  further development of a cyst in the interval between the arachnoid and the  thecal sac.  Nurolon 4-0 sutures were used to approximate the layers of the  arachnoid and both the margins anteriorly and posteriorly of  the thecal sac  epidural.  These were done interrupted.  Approximately five sutures were  necessary.  Once this was closed, the patient underwent Valsalva maneuver  demonstrating no evidence of leakage.  Tisseel fibrin glue was then mixed  and held until 20 minutes after prep.  This was then placed over the repair  site, providing a watertight seal.  A hockey stick neural probe could then  be passed out the neural foramen of L5 superiorly, L4 foramen inferiorly and  superiorly, and inferiorly to the L5 nerve root.  Gelfoam was then placed  over the posterior aspect of the laminotomy defect at L3-4 and superior  aspect of L4-5.  Irrigation.  Soft tissue was allowed to fall back into  place.  The lumbodorsal fascia approximated in the midline with interrupted  #1 Vicryl sutures, deep subcu layers with interrupted #1 and 0 Vicryl  sutures, and the skin closed with a running subcu stitch of 4-0 Vicryl,  following 2-0 Vicryl approximating the most superficial fatty layers.  Tincture of Benzoin and Steri-Strips applied, 4 x 4's fixed to the skin with  paper tape.  The patient then returned to the supine position, reactivated,  extubated, and returned to the recovery room in satisfactory condition.  All  instrument and sponge counts were correct.                                               Kerrin Champagne, M.D.    JEN/MEDQ  D:  01/15/2003  T:  01/15/2003  Job:  [id number]

## 2010-06-20 NOTE — Op Note (Signed)
Andrea Rubio, Andrea Rubio                        ACCOUNT NO.:  [id number]   MEDICAL RECORD NO.:  [id number]                   PATIENT TYPE:  INP   LOCATION:  X001                                 FACILITY:  [facility]   PHYSICIAN:  Ollen Gross, M.D.                 DATE OF BIRTH:  1925/04/15   DATE OF PROCEDURE:  06/12/2002  DATE OF DISCHARGE:                                 OPERATIVE REPORT   PREOPERATIVE DIAGNOSIS:  Osteoarthritis, left shoulder.   POSTOPERATIVE DIAGNOSIS:  Osteoarthritis, left shoulder.   PROCEDURES:  Left shoulder hemiarthroplasty.   SURGEON:  Gus Rankin. Aluisio, M.D.   ASSISTANT:  Alexzandrew L. Julien Girt, P.A.   ANESTHESIA:  General.   ESTIMATED BLOOD LOSS:  200.   DRAIN:  Hemovac x 1.   COMPLICATIONS:  None.   CONDITION:  Stable to recovery.   BRIEF CLINICAL NOTE:  Ms. Rankin is a 77 year old female, who has had  multiple rotator cuff surgeries on her left shoulder and now has developed  significant arthritis in the glenohumeral joint.  She has had rest pain as  well as activity-related pain.  She has had a previous successful shoulder  hemiarthroplasty on the right and now presents for the same procedure on the  left.   PROCEDURE IN DETAIL:  After the successful administration of general  anesthetic, the patient is placed in the supine position on the operating  table and head of bed elevated approximately 30 degrees, then knees flexed.  The left upper extremity is isolated from her trunk with plastic drapes and  prepped and draped in the usual sterile fashion.  A standard deltopectoral  incision line is drawn.  The subcutaneous tissue is injected with 20 mL of  1% Xylocaine with epinephrine.  Incision is then made with a 10 blade  through subcutaneous tissue, and the cephalic vein is identified.  The  deltopectoral interval is established and vein retracted laterally.  Then  released the superior one-third of the pec tendon to gain better exposure.  The clavipectoral fascia is dissected and then with the shoulder externally  rotated, I vertically incised the subscapularis tendon, tagging in the  medial aspect with Ethibond sutures.  We then dislocated the shoulder and  noted that there was significant cartilage loss on the humeral head.  With  the arm externally rotated about 30 degrees, the cutting guide is placed and  the cut made with the oscillating saw to allow for approximately 30 degrees  of retroversion.  I then broached up to a size 8 and placed the trial 44 x  18 centric head.  The shoulder is reduced, and there is excellent mobility  and stability.  I could translate approximately 50% posterior, 50% inferior.  I inspected the supraspinatus and infraspinatus, and those repairs were  intact.  Thus, it was felt we did not need to do a CTA head, and we can go  with a standard head.  I then removed all of the trials and placed the  global size 8 stem in 30 degrees retroversion.  I decided I had a very  stable fit, and then we placed the 44 x 18 centric head to cover the cut  bone surface.  The head is impacted and the shoulder reduced.  There is  excellent stability throughout.  It was then copiously irrigated with  antibiotic solution.  Prior to this, I inspected the glenoid, and it was  concentric with just slight cartilage loss.  The subscapularis is then  closed with the Ethibond for a very stable repair.  The wound is again  irrigated, Hemovac drain placed, and the deltopectoral interval closed with  #1 Vicryl, preserving the vein.  Subcu is closed with interrupted 2-0  Vicryl, subcuticular with running 4-0 Monocryl.  Incision is cleaned and  dried and Steri-Strips and a bulky sterile dressing applied.  She is placed  into a shoulder immobilizer, awakened, and transported to recovery in stable  condition.                                               Ollen Gross, M.D.    FA/MEDQ  D:  06/12/2002  T:  06/12/2002  Job:   [id number]

## 2010-06-20 NOTE — H&P (Signed)
NAMELILLIANA, Andrea Rubio                        ACCOUNT NO.:  [id number]   MEDICAL RECORD NO.:  [id number]                   PATIENT TYPE:  INP   LOCATION:  0451                                 FACILITY:  [facility]   PHYSICIAN:  Ollen Gross, M.D.                 DATE OF BIRTH:  [birth year]   DATE OF ADMISSION:  06/12/2002  DATE OF DISCHARGE:  06/15/2002                                HISTORY & PHYSICAL   CHIEF COMPLAINT:  Left shoulder pain.   HISTORY OF PRESENT ILLNESS:  The patient is a 77 year old female well known  to Ollen Gross, M.D.  She has previously undergone rotator cuff repair of  the left shoulder in the past.  However, has had continued left shoulder  pain and dysfunction.  She has problems moving her upper extremity and has  had increasing constant pain in that left shoulder for some time now.  She  is seen in the office and found to have significant degenerative changes  with joint space narrowing and also spur formation in and about the  shoulder.  She has been discussed at length with her in the past concerning  she possibly would require hemiarthroplasty.  Due to her continued pain and  lack of function, patient would like to proceed with surgical intervention.  Risks and benefits discussed and the patient has elected to proceed with  surgery.   ALLERGIES:  CODEINE causes hallucinations, MORPHINE.   CURRENT MEDICATIONS:  1. Verapamil 240 mg daily.  2. Demadex 20 mg daily.  3. Labetalol 200 mg b.i.d.  4. Altace 10 mg daily.  5. K tablets 10 mEq b.i.d.  6. Premarin 1.25 mg daily.  7. Prednisone 5 mg daily.  8. Quivron T/SR tablets b.i.d.  9. Ventolin inhaler q.i.d.  10.      Flovent inhaler b.i.d.  11.      Prilosec 20 mg q.a.m.  12.      Metoclopramide 10 mg at night.  13.      Fosamax 70 mg weekly.  14.      Crestor 10 mg daily.  15.      Mobic 7.5 mg b.i.d.  16.      Amitriptyline 25 mg at night.  17.      Rhinocort inhaler b.i.d.  18.      Allergy  shots weekly.   PAST MEDICAL HISTORY:  1. History of asthma.  2. Bronchitis.  3. Hypertension.  4. Hiatal hernia.  5. Reflux disease.  6. Urinary incontinence.  7. History of anemia.  8. Squamous cell skin cancer.  9. Right ankle fracture.  10.      Osteoarthritis.  11.      Hyperlipidemia.   PAST SURGICAL HISTORY:  1. Right shoulder rotator cuff repair in 1991.  2. Right shoulder hemiarthroplasty 2001.  3. Left rotator cuff repair in 1996 and a second time in 2003.  4. She  has undergone four cesarean sections.  5. Sinus surgery 1986.  6. Gallbladder surgery 1990.  7. She has also undergone bladder tack procedure.   SOCIAL HISTORY:  Widowed.  Retired Engineer, site.  Nonsmoker.  No alcohol.  Has three children.  Sister will be helping with her care after surgery.   FAMILY HISTORY:  Mother deceased age 51 with hypertension.  Father deceased  age 82 with hypertension.  Her mother also had kidney problems.   REVIEW OF SYSTEMS:  GENERAL:  No fevers, chills, night sweats.  NEUROLOGIC:  No seizures, syncope, paralysis.  RESPIRATORY:  She has had an intermittent  cough.  She does have history of asthma with bronchitis.  No shortness of  breath.  No hemoptysis.  CARDIOVASCULAR:  No chest pain, angina, orthopnea.  She does have a little bit of shortness of breath on exertion.  No shortness  of breath at rest.  GASTROINTESTINAL:  Hiatal hernia with reflux.  No  nausea, vomiting, diarrhea, constipation.  GENITOURINARY:  She does have  some urinary incontinence.  No dysuria, hematuria, discharge.  MUSCULOSKELETAL:  Pertinent to that of the shoulder found in the history of  present illness.   PHYSICAL EXAMINATION:  VITAL SIGNS:  Pulse 68, respirations 16, blood  pressure 138/68.  GENERAL:  The patient is a 77 year old female short in stature white female,  no acute distress.  She is alert, oriented, cooperative.  HEENT:  Normocephalic, atraumatic.  Pupils round, reactive.  NECK:   Supple.  Only faint carotid bruit on the right.  CHEST:  Clear to auscultation anterior/posterior chest walls.  No rhonchi,  rales, or wheezing.  HEART:  Regular rate and rhythm.  No murmur.  S1, S2 noted.  ABDOMEN:  Soft, nontender.  Bowel sounds present.  RECTAL:  Not done.  Not pertinent to present illness.  BREASTS:  Not done.  Not pertinent to present illness.  GENITALIA:  Not done.  Not pertinent to present illness.  EXTREMITIES:  Pertinent to that of the right upper extremity.  Actively she  has forward elevation of 70 degrees, abduction of 40, and external rotation  of 20.  Passively can get her up to 120 degrees of flexion, 100 degrees of  abduction, and 40 degrees of external rotation.  There is also crepitus with  motion.   IMPRESSION:  1. Osteoarthritis left shoulder.  2. Asthma.  3. Bronchitis.  4. Hypertension.  5. Hiatal hernia.  6. Reflux disease.  7. Urinary incontinence.  8. History of anemia.  9. Squamous skin cell cancer.  10.      Right ankle fracture.  11.      Osteoarthritis.   PLAN:  The patient will be admitted to [facility] to undergo a  left shoulder hemiarthroplasty.  Surgery will be performed by Ollen Gross,  M.D.     Alexzandrew L. Julien Girt, P.A.              Ollen Gross, M.D.    ALP/MEDQ  D:  07/27/2002  T:  07/27/2002  Job:  [id number]

## 2010-06-20 NOTE — Discharge Summary (Signed)
Andrea Rubio, Andrea Rubio                        ACCOUNT NO.:  [id number]   MEDICAL RECORD NO.:  [id number]                   PATIENT TYPE:  INP   LOCATION:  0451                                 FACILITY:  [facility]   PHYSICIAN:  Loanne Drilling, M.D.             DATE OF BIRTH:  1925-08-03   DATE OF ADMISSION:  06/12/2002  DATE OF DISCHARGE:  06/15/2002                                 DISCHARGE SUMMARY   ADMITTING DIAGNOSES:  1. Osteoarthritis left shoulder.  2. Asthma.  3. Bronchitis.  4. Hypertension.  5. Hiatal hernia.  6. Reflux disease.  7. Urinary incontinence with cystitis.  8. History of anemia.  9. Squamous skin cell cancers.  10.      History of right ankle fracture.  11.      Osteoarthritis.   DISCHARGE DIAGNOSES:  1. Osteoarthritis left shoulder; status post left shoulder hemiarthroplasty.  2. Asthma.  3. Bronchitis.  4. Hypertension.  5. Hiatal hernia.  6. Reflux disease.  7. Urinary incontinence with cystitis.  8. History of anemia.  9. Squamous cell skin cancer.  10.      History of right ankle fracture.  11.      Osteoarthritis.   PROCEDURE:  The patient was taken to the OR on Jun 12, 2002.  Underwent a  left shoulder hemiarthroplasty.  Surgeon:  Gus Rankin. Rubio, M.D.  Assistant:  Alexzandrew L. Perkins, P.A.-C.  Surgery under general  anesthesia.  Estimated blood loss 200 mL.  Hemovac drain x1.   BRIEF HISTORY:  The patient is a 77 year old female well known to Dr. Homero Fellers  Rubio.  She has a known history of osteoarthritis.  She has previously  undergone rotator cuff repair on that shoulder twice now.  She has had  continued problems with progressive pain and dysfunction.  It is felt she  would benefit from undergoing hemiarthroplasty.  Risks and benefits  discussed.  Subsequently admitted to the hospital.   LABORATORY DATA:  CBC:  On admission, hemoglobin 12.9, hematocrit 38.7,  white cell count 9.5, red cell count 4.38, differential showed  elevated  neutrophils of 81.  Remaining differential within normal limits.  PT/PTT on  admission 13.0 and 27, respectively.  INR 0.9.  Chemistry panel:  Minimally  elevated BUN 25 and minimally decreased level of ALT to 37; otherwise,  chemistry panel within normal limits.  Urinalysis on admission negative.  Blood group type O+.   EKG dated Jun 06, 2002:  Normal sinus rhythm, nonspecific ST abnormalities.  Since last tracing May 05, 2002, no significant change.  Confirmed by Dr.  Lillia Mountain.   Chest x-ray dated Jun 06, 2002:  Postoperative changes, no active disease,  mild cardiomegaly is stable.   HOSPITAL COURSE:  The patient was admitted to [facility], taken to  the OR, and underwent the above-stated procedure without complications.  The  patient tolerated the procedure well.  Allowed to  transfer to recovery room,  then to orthopedic floor for continued postoperative care.  Vital signs were  followed.  The patient was placed on PCA and p.o. analgesia for pain control  following surgery.  She has also been on long-term prednisone.  She  presently on prednisone taper.  She was given 24 hours of IV antibiotics,  given antiemetics and also muscle relaxants.  Home medications were  restarted.  Hemovac drain placed at the time of surgery was pulled on  postoperative day #1.   By postoperative day #2, the patient was doing much better and started  getting up in the room.  Postoperative occupational therapy was ordered for  ADLs.  She was working with occupational therapy; however, getting up a  little bit slow; therefore, her discharge was held.  Dressing change  initiated.  On postoperative day #2, the incision was healing well.  No  signs of infection.  By day #3, the patient was doing much better and  tolerating p.o. medications.  Was up moving around a little bit easier.  Was  ready to go home.   DISCHARGE PLAN:  The patient was discharged home on Jun 15, 2002.    DISCHARGE DIAGNOSES:  Please see above.   DISCHARGE MEDICATIONS:  1. Demerol.  2. Robaxin.  3. Prednisone.   DIET:  As tolerated.   ACTIVITY:  Occupational therapy for home ADLs and pendulums, passive range  of motion with flexion maximum of 100 degrees, maximum abduction 100  degrees, maximum external rotation 10 degrees.   FOLLOWUP:  Follow up next Tuesday.  Call for an appointment [phone].   DISPOSITION:  Home.   CONDITION ON DISCHARGE:  Improved.     Alexzandrew Tessie Fass, P.A.-C           Loanne Drilling, M.D.    ALP/MEDQ  D:  07/27/2002  T:  07/27/2002  Job:  [id number]

## 2010-06-20 NOTE — Discharge Summary (Signed)
NAMEJERMIA, RIGSBY              ACCOUNT NO.:  [id number]   MEDICAL RECORD NO.:  [id number]          PATIENT TYPE:  INP   LOCATION:  3731                         FACILITY:  [facility]   PHYSICIAN:  Lonzo Cloud. Kriste Basque, M.D. [facility] OF BIRTH:  04-05-1925   DATE OF ADMISSION:  04/06/2004  DATE OF DISCHARGE:  04/10/2004                                 DISCHARGE SUMMARY   DISCHARGE DIAGNOSES:  1.  Admitted on April 06, 2004, complaining of my throat is closing up and      dyspnea.  Evaluation revealed mild congestive heart failure and the      patient was diuresed.  She was also quite anxious and was reassured.  2.  History of failure to thrive with recent nursing home stay for      rehabilitation purposes.  She had just spent 6 weeks at [facility] and was discharged 1 week prior to this admission.  During the      stay at [facility], she was diagnosed as having a severe dermatitis related      to Allopurinol, a reaction to codeine causing hallucinations,      intolerance to Statin drugs and questionable intolerance to Macrodantin      as well.  3.  History of asthmatic bronchitis which is steroid-dependent.  4.  History of hypertension, controlled on medications.  5.  History of arteriosclerotic peripheral vascular disease with decreased      ankle branchial indices.  6.  History of venous insufficiency with peripheral edema.  7.  History of hypercholesterolemia, currently treated with diet alone,      previously treated with Pravachol.  8.  History of hiatus hernia and gastroesophageal reflux disease; evaluation      this admission with a barium swallow showing a stricture formation in      the distal esophagus and subsequent endoscopy by Dr. Russella Dar with      dilatation.  9.  History of diverticulosis.  10. History of urinary stress incontinence.  11. History of degenerative arthritis with previous lumbar laminectomy and      bilateral shoulder surgery.  12. History of  osteoporosis on Fosamax.  13. History of hyperuricemia and gout.  The patient is allergic to      Allopurinol as noted above.  14. History of anxiety.   HISTORY OF PRESENT ILLNESS:  The patient is a 77 year old, white female  known to [place] from previous hospitalization office followup.  She was  hospitalized at [facility] from February 06, 2004, through February 15, 2004, and discharged to [facility] as noted.  She spent the  next 6 weeks at [facility] facility under the care of Dr. Kevan Ny.  While at  [facility], Dr. Kevan Ny diagnosed her with a severe dermatitis  secondary to Allopurinol, allergy to codeine due to hallucination,  intolerance to statin drugs that cause leg weakness and questionable allergy  to Macrodantin as well.  Since her return home, she reported feeling poorly.  She presented to the emergency room on April 06, 2004, with acute shortness  of breath and a feeling that her throat was closing up.  She was given Lasix  and oxygen in the emergency room with some improvement.  She was referred  for admission because of her multiple medical comorbidities.   PAST MEDICAL HISTORY:  As noted above and on the previous discharge summary.   PHYSICAL EXAMINATION:  GENERAL:  Elderly, white female in mild distress with  anxiety and dyspnea.  VITAL SIGNS:  Blood pressure 160/60, pulse 108 and regular, respirations 26  per minute and slightly shallow, temperature 101, O2 saturations 97% on 2 L.  HEENT:  Unremarkable except for dry mucous membranes.  NECK:  No jugular venous distention, no carotid bruits, no thyromegaly or  lymphadenopathy.  CHEST:  Decreased air movement, but no wheezing.  CARDIAC:  Regular rhythm with grade 1/6 systolic ejection murmur at the left  sternal border.  No rubs or gallops detected.  ABDOMEN:  Obese, but soft and nontender without evidence of organomegaly or  masses.  EXTREMITIES:  Peripheral edema and degenerative arthritis.   NEUROLOGIC:  Intact without focal abnormalities detected.  The patient was  somewhat nervous.  SKIN:  Negative.   LABORATORY DATA AND X-RAY FINDINGS:  EKG showed sinus tachycardia and  nonspecific ST and T wave changes.  Subsequent EKG showed normal sinus  rhythm and nonspecific ST and T wave changes.  A 2D echocardiogram was  performed revealing overall normal left ventricular systolic function with  ejection fraction estimated being 55-65%; the study was inadequate for the  evaluation of regional wall motion, however; mitral valve thickness mildly  increased with some mildly reduced aortic valve leaflet excursion; mild  aortic valve regurgitation; moderate mitral annular calcification and mild  mitral regurgitation.  Chest x-ray showed mild cardiac enlargement with some  perihilar interstitial opacities suggesting early edema.  A CT angiogram  showed no evidence of pulmonary embolism, small bilateral effusions,  cardiomegaly and probably early congestive heart failure, small hiatus  hernia, aberrant, right subclavian artery.  Followup chest x-ray showed  possible early edema.  She had a barium swallow showing stricture formation  in the region of the GE junction.  Endoscopy by Dr. Russella Dar on April 09, 2004,  showed hiatus hernia and esophageal stricture which was dilated with a  Savory dilator.  Pulmonary function studies showed FEC 1.40 L (64% of  predicted), FEV-1 0.96 L (65% of predicted), FEV-1/FVC ratio of 69%.  Mid  flows were reduced at 41% of the predicted normal.  Lung volumes could not  be obtained.  Diffusion capacity was normal when corrected for the alveolar  ventilation.  These studies were interpreted as showing mild to moderate air  flow obstruction with possibility of superimposed restriction.   Hemoglobin 11.6, hematocrit 34.8, white count 13,200 with 85% segs.  Protime  13.4, INR 1.0, PTT 32 seconds.  Sodium 141, potassium 3.2, chloride 109, CO2 22, BUN 17, creatinine  0.7, blood sugar 106, calcium 8.0.  Total protein  6.2, albumin 2.7, AST 60, ALT 54, Alk phos 107, total bilirubin 0.7.  CPK 45  with negative MB and negative troponin.  B-type natriuretic peptide at 218  with a repeat of 198.  TSH 3.7.  Urinalysis clear.  Two blood cultures  negative.  Urine culture negative.   HOSPITAL COURSE:  The patient was admitted with mild congestive heart  failure, dyspnea of uncertain etiology and a low-grade fever with a white  count of 13,000.  She was placed on Rocephin and Zithromax by Dr. Shelle Iron.  She responded nicely with resolution of her fever and slow overall  improvement.  She was diuresed with oral Demodex.  CT scan failed to reveal  any pulmonary emboli or other etiology.  A 2D echocardiogram showed  preserved left ventricular systolic function, but regional wall motion  abnormalities could not be ascertained.  Pulmonary function studies revealed  evidence of some air flow obstruction and superimposed restriction as noted.  The patient was seen by physical therapy and occupational therapy and the  discharge planners.  She was slowly ambulated on the ward and medications  were adjusted.  She had a barium swallow that showed stricture and she was  seen by GI and Dr. Russella Dar performed endoscopy with dilatation.  She continued  to improve nicely on this regimen and was felt to be MHB and ready for  discharge on April 10, 2004.   DISCHARGE MEDICATIONS:  1.  Home nebulizer with Xopenex 1.25 mg use three times a day and extra as      needed.  2.  Prednisone 10 mg p.o. q.d.  3.  Prilosec 20 mg p.o. q.d.  4.  Metoclopramide 5 mg p.o. q.i.d. a.c. and h.s.  5.  Demodex 20 mg p.o. q.a.m.  6.  K-Dur 20 mEq one tablet p.o. q.a.m.  7.  Verapamil 240 mg p.o. q.d.  8.  Avapro 300 mg p.o. q.d.  9.  Labetalol 200 mg p.o. b.i.d.  10. Amitriptyline 25 mg p.o. q.h.s.  11. Fosamax 70 mg p.o. each week.  12. Os-Cal 500 mg p.o. b.i.d.  13. Multivitamin daily.  14.  Niferex iron 150 mg p.o. q.d.  15. Tylenol as needed for pain.   DIET:  No salt diet.  Take nutritional supplements in addition to her  regular meals.   FOLLOW UP:  She will follow up with Dr. Kriste Basque in the office on Tuesday,  April 15, 2004, at 1:45 p.m.   CONDITION ON DISCHARGE:  Improved.      SMN/MEDQ  D:  04/10/2004  T:  04/10/2004  Job:  [id number]

## 2010-06-20 NOTE — Discharge Summary (Signed)
Andrea Rubio, Andrea Rubio              ACCOUNT NO.:  [id number]   MEDICAL RECORD NO.:  [id number]          PATIENT TYPE:  INP   LOCATION:  0448                         FACILITY:  [facility]   PHYSICIAN:  Lonzo Cloud. Kriste Basque, M.D. [facility] OF BIRTH:  18-Dec-1925   DATE OF ADMISSION:  02/06/2004  DATE OF DISCHARGE:  02/15/2004                                 DISCHARGE SUMMARY   FINAL DIAGNOSES:  1.  Admitted on February 06, 2004 via the emergency room with weakness,      malaise, and failure to thrive at home.  The patient had a nonspecific      viral syndrome superimposed on her numerous medical problems listed      below and required hospitalization, intravenous fluids, physical      therapy, and eventual nursing home placement for rehabilitation      purposes.  2.  History of asthmatic bronchitis which has been steroid-dependent.  3.  History of hypertension - controlled on medications.  4.  History of arteriosclerotic peripheral vascular disease with decreased      ankle-brachial indices.  5.  History of venous insufficiency with peripheral edema.  6.  History of hypercholesterolemia - controlled on Pravachol.  7.  History of hiatus hernia and gastroesophageal reflux disease.  8.  History of diverticulosis.  9.  History of urinary stress incontinence.  10. History of degenerative arthritis with previous lumbar laminectomy and      bilateral shoulder surgery.  11. Osteoporosis on Fosamax.  12. History of hyperuricemia and gout.  13. History of anxiety.   BRIEF HISTORY AND PHYSICAL:  The patient is a 77 year old white female known  to me with multiple medical problems, as listed above.  She presented to the  emergency room via EMS on February 06, 2004 with a one-week history of feeling  bad and I have the flu.  She noted diffuse aching and soreness and feeling  bad starting about six days prior to admission.  She noted slight congestion  and drainage.  She had minimal cough without sputum  production.  She had  diffuse aching in her muscles and soreness.  She noted a low-grade  temperature as well.  She had some nausea with dry heaves and slight loose  bowel movements.  There was no sore throat or sinus infection.  There was no  discolored sputum production.  She denied dysuria.  She developed increasing  weakness, and family felt she was dehydrated and brought her to the  emergency room via EMS for admission.  She was last hospitalized in 1996 by  Dr. Otelia Sergeant for shoulder surgery.   PAST MEDICAL HISTORY:  1.  She has a history of asthmatic bronchitis and is steroid-dependent on      Prednisone 5 mg a day along with Quibron, Ventolin, Flovent, and      Rhinocort.  She has been stable on these medications.  2.  She had a fractured left 10th rib after a fall in 1999.  3.  History of hypertension controlled on verapamil, Demadex, labetalol, and      Altace.  4.  Arteriosclerotic  peripheral vascular disease with known decreased ankle-      brachial indices of 0.85 on the right and 0.89 on the left in 2001.  She      is quite sedentary and has not had any claudication symptoms.  5.  History of venous insufficiency with edema and has been on Demadex and      potassium supplementation.  She had negative venous Dopplers in 2002.  6.  History of hypercholesterolemia, previously on Crestor which was stopped      because of light weakness.  She had been on diet and more recently      Pravachol 40 mg a day.  7.  History of hiatus hernia and gastroesophageal reflux disease.  She had      an endoscopy in 1993 by Dr. Victorino Dike that showed a 3-cm hernia with a      stricture that was dilated.  There was also some esophagitis and      gastritis.  8.  History of diverticulosis.  Last flexible sigmoidoscopy was in 1989.  9.  History of urinary stress incontinence with urologic evaluation by Dr.      Etta Grandchild and surgery in 1995 with a Raz urethropexy.  10. History of degenerative arthritis,  low back pain, and previous lumbar      laminectomy by Dr. Otelia Sergeant.  11. Bilateral shoulder surgery.  12. Osteoporosis and has been taking Fosamax, calcium, and vitamins.  13. History of hyperuricemia and gout with tophi in her fingers.  14. History of anxiety.   PAST SURGICAL HISTORY:  1.  Previous cataract surgery.  2.  Gallbladder surgery.  3.  Sinus surgery.  4.  Two C-sections.   PHYSICAL EXAMINATION:  GENERAL:  Physical examination revealed a 77 year old  white female in no acute distress.  She is chronically ill-appearing.  VITAL SIGNS:  Blood pressure 100/60, pulse 64 per minute and regular,  respirations 18 per minute and nonlabored, temperature 100 degrees orally.  HEENT:  Dry mucous membranes.  Otherwise negative.  NECK:  No jugular venous distension, no carotid bruits, no thyromegaly or  lymphadenopathy.  RESPIRATORY:  Decrease in breath sounds but clear, except for a few  scattered rhonchi.  There were no wheezes, rales, or signs of consolidation.  CARDIAC:  Regular rhythm, grade 1/6 systolic ejection murmur in the left  sternal border.  No rubs or gallops heard.  ABDOMEN:  Soft, essentially nontender, with normal bowel sounds.  There was  no evidence of organomegaly or masses.  EXTREMITIES:  Degenerative arthritis.  Evidence of previous lumbar  laminectomy and bilateral shoulder operations.  Tophaceous deposits in some  of the finger joints.  NEUROLOGIC:  Intact, without focal abnormalities detected.   LABORATORY DATA:  EKG showed normal sinus rhythm and was within normal  limits.  Chest x-ray revealed a heart size in the upper limits of normal.  There was some hyperinflation and underlying COPD.  Bilateral shoulder  prostheses were noted.  No acute changes.   Hemoglobin 12.5, hematocrit 37.7, white count 9800.  Hemoglobin 10.8 after  hydration.  Sed rate 52.  Protime 12.9, INR 1.0, PTT 30 seconds.  Sodium 137, potassium 4.1, chloride 105, CO2 25, BUN 13, creatinine  0.8, blood  sugar 89, calcium 9.1, total protein 6.2, albumin 2.6 which dropped to 2.0  during the hospitalization.  AST 27, ALT 20, alkaline phosphatase 61, total  bilirubin 0.7, uric acid 4.7.  TSH 2.25 with alpha level less than 2.  Urinalysis clear.  Blood cultures negative x2.  Influenza viral titers  showed negative IgG titers and positive IgM titers for both influenza A and  B.   HOSPITAL COURSE:  The patient was admitted with a viral syndrome, weakness,  and failure to thrive.  Results of initial laboratory data turned up nothing  specific.  She was placed on IV fluids.  Her 5 mg Prednisone was boosted to  10 mg a day, and she was given hand-held nebulizer treatments.  Her  hypertensive medicine was continued, and her blood pressure was followed  carefully.  These were adjusted during her hospitalization.  The patient  improved slowly with the fluid hydration, and the IV fluids were  discontinued in favor of her diet.  She was encouraged to ambulate with  physical therapy and occupational therapy, but they felt that she was too  unsteady and weak to be safely discharged home.  She was evaluated by [facility]  who felt she was not vigorous enough for their rehabilitation unit.  Therefore, requests were sent out for nursing home placement for  rehabilitation purposes.  A bed is being secured at the __________ facility  for ongoing rehabilitation.   DISCHARGE MEDICATIONS:  1.  Hand-held nebulizer with triple combo medications (albuterol 2.5 mg,      Atrovent 0.5 mg, and budesonide 0.25 mg).  Use in nebulizer t.i.d. with      extra treatment p.r.n.  2.  Prilosec 20 mg p.o. daily.  3.  Metoclopramide 10 mg p.o. q.h.s.  4.  Verapamil 240 mg p.o. daily.  5.  Altace 5 mg p.o. daily.  6.  Labetalol 200 mg p.o. b.i.d.  7.  Etodolac 400 mg p.o. b.i.d. with food.  8.  Colchicine 0.6 mg p.o. daily.  9.  Prednisone 5 mg p.o. daily.  10. Multivitamin daily.  11. Niferex iron 150 mg p.o.  daily.  12. Os-Cal 500 one p.o. b.i.d.  13. Ensure supplements, one can daily.  14. Fosamax 70 mg p.o. weekly.  15. Amitriptyline 25 mg p.o. q.h.s.  16. Allopurinol 300 mg p.o. daily.   ALLERGIES:  CODEINE AND INTOLERANT TO CRESTOR.   DISPOSITION:  The patient is being transferred to the __________ facility  for inpatient rehabilitation.   CONDITION ON DISCHARGE:  Improved.      SMN/MEDQ  D:  02/15/2004  T:  02/15/2004  Job:  [id number]   cc:   Nursing Home

## 2010-06-20 NOTE — Discharge Summary (Signed)
Andrea Rubio, Andrea Rubio              ACCOUNT NO.:  [id number]   MEDICAL RECORD NO.:  [id number]          PATIENT TYPE:  INP   LOCATION:  6737                         FACILITY:  [facility]   PHYSICIAN:  Lonzo Cloud. Kriste Basque, M.D. [facility] OF BIRTH:  07-Sep-1925   DATE OF ADMISSION:  07/04/2004  DATE OF DISCHARGE:  07/09/2004                                 DISCHARGE SUMMARY   FINAL DIAGNOSIS:  1.  Admitted 07/04/2004 with anemia.  Hemoglobin 7.4 requiring 2 units      packed cell transfusion and 1000 milligrams of intravenous iron dextran      this admission. This anemia is an acute blood loss anemia secondary to      ischemic colitis.  2.  Ischemic colitis seen on colonoscopy 07/05/2004 by Dr. Elnoria Howard covering for      [facility]. She was treated conservatively and had a short course of      Asacol therapy.  3.  Failure to thrive and generalized geriatric decline.  The patient has      had two previous admissions this year, one in  January and one in March,      and spent several months at the nursing home because of weakness.      Currently, she is back in her own home with maximum home health support.  4.  History of asthmatic bronchitis, steroid dependent.  5.  History of hypertension. Medications adjusted this admission.  6.  History of arteriosclerotic peripheral vascular disease with decrease in      ankle brachial indices.  7.  History of venous insufficiency with peripheral edema.  8.  History of hypercholesterolemia, currently treated with diet alone,      previously on Pravachol.  9.  History of hiatal hernia and gastroesophageal reflux disease, evaluation      in March of 2006 showing esophageal stricture which was dilated by Dr.      Russella Dar.  10. History of diverticulosis.  11. History of urinary stress incontinence.  12. History of degenerative arthritis and gout. She has also had a previous      lumbar laminectomy and bilateral shoulder surgeries.  13. History of osteoporosis on  Fosamax.  14. History of hyperuricemia and gout. The patient is allergic to      allopurinol with rash.  15. History of anxiety.   BRIEF HISTORY AND PHYSICAL:  The patient is a 77 year old white female known  to me with multiple problems as noted above. As noted, she has two previous  admissions this year, one in January and one in March and spent 6 weeks at  the nursing center for rehab purposes. She has been home for the last month  or so and presented to the office with weakness and dizziness of several  days' duration. She complained of some lightheadedness on standing.  Laboratory data showed a hemoglobin of 7.4 and stool guaiac was positive.  She had been on prednisone 5 milligrams a day for a long time for her  asthmatic bronchitis and her severe arthritis. She also takes Relafen twice  a day for her arthritis. She  is admitted for transfusion and further  evaluation for what appeared to be an acute blood loss anemia.   PAST MEDICAL HISTORY:  Please see above problem list and two previous  discharge summaries from January and March 2006.   PHYSICAL EXAMINATION:  GENERAL:  Elderly white female chronically ill-  appearing.  VITAL SIGNS: Blood pressure 112/64, pulse 76 and regular, respirations 20  per minute and not labored, O2 sat 96% on room air. Weight 37 pounds.  Temperature was 92.  HEENT: Unremarkable.  NECK: No jugular distension, no carotid bruits, no thyromegaly, or  lymphadenopathy.  CHEST: Decrease in breath sounds and a few scattered rhonchi.  CARDIAC: Regular rate and rhythm, grade 1/6 systolic ejection murmur in the  left sternal border, no rubs or gallops heard.  ABDOMEN:  Soft with intact bowel sounds, no guarding or rebound. No evidence  of hepatosplenomegaly. Stool was  heme test positive. Abdomen was somewhat  distended.  EXTREMITIES: Gouty changes and degenerative arthritis, venous insufficiency  with trace edema nerves.  NEUROLOGIC: Intact without focal  abnormalities detected.   LABORATORY DATA:  Chest x-ray showed cardiomegaly and no active lung  disease. Bilateral shoulder replacements were seen. Flat plate of the  abdomen showed a nonspecific bowel gas pattern and surgical clips from prior  cholecystectomy. Thoracolumbar scoliosis with degenerative changes noted.  Hemoglobin 7.0, hematocrit 21.5, white count 19,600 with 91% segs. The  patient was transfused 2 units of packed cells. Hemoglobin improved to 9.6  and stabilized at 9.3. White count improved to 13,000. Sed rate 96. Stool  for local blood positive x3, sodium 139, potassium 6.0 initially. This  improved to 3.7. Chloride 109 and CO2 19. This improved to 26.  BUN 49, this  improved to 20. Creatinine 1.3 and this improved to 0.8. Blood sugar 103,  calcium 6.0. This improved to 7. Total protein 5.6, albumin 2.0, AST 19, ALT  29, alk phos 76, total bilirubin 0.6. Beta natruretic peptide 242. TSH is  0.39, iron 11, TIBC 173 for a saturation of 6%. B12 level 860. Urinalysis:  Clear. Blood type A+. Urine culture negative.   HOSPITAL COURSE:  The patient was admitted with the heme-positive stools. GI  consult was immediately obtained and the patient was seen by Dr. Arlyce Dice. He  arranged for colonoscopy to be done on 07/05/2004 and this was done by Dr.  Elnoria Howard covering for [facility]. He found evidence of ischemic colitis and  performed a biopsy which showed active mucosal colitis associated with  fibrinopurulent exudate and mucosal erosion/ulceration. He recommended brief  course of Asacol and this was discontinued by Dr. Arlyce Dice prior to discharge.  The patient was treated conservatively with transfusion, IV fluids, and  management of her blood pressure. She improved steadily and her diet was  advanced. She was felt to be MHB and ready for discharge on 07/09/2004. Dr.  Arlyce Dice did not recommend any additional workup or treatment course.  The patient was seen by physical therapy,  occupational therapy, and the care  management team. They made recommendations for home management with home  health PT, home health RN, and any DME needs were met. The patient was  discharged on 07/09/2004.   MEDICATIONS AT DISCHARGE:  1.  Avapro 300 milligrams p.o. q.d.  2. Verapamil 240 milligrams p.o. q.d.Marland Kitchen      3. Demadex 20 milligrams p.o. q.d.  2.  K-Dur 21 tablets p.o. q.d.  5. Prednisone 5 milligrams p.o. q.d.  6.  Prilosec 20 milligrams p.o. q.d.  7. Reglan 10 milligrams p.o. q.h.s.      8. Nebulizer therapy with Xopenex three times a day.  9. Fosamax 70      milligrams p.o. weekly.  10. Calcium supplementation daily.  11.      Multivitamin daily.  12 Extra Strength Tylenol 1-2 tablets every 4 hours      as needed for pain.   DISCHARGE INSTRUCTIONS:  The patient was instructed on a low sodium diet and  instructed to stop her previous labetalol therapy for the time being. She  will follow-up in the office of Dr. Kriste Basque on Tuesday, 07/15/2004, at noon at  which time we will check her blood count level.   CONDITION ON DISCHARGE:  Improved.       SMN/MEDQ  D:  07/09/2004  T:  07/09/2004  Job:  [id number]   cc:   Lonzo Cloud. Kriste Basque, M.D. [facility]

## 2010-06-20 NOTE — Consult Note (Signed)
NAMEMARGARETHE, Rubio              ACCOUNT NO.:  [id number]   MEDICAL RECORD NO.:  [id number]          PATIENT TYPE:  INP   LOCATION:  0448                         FACILITY:  [facility]   PHYSICIAN:  Lemmie Evens, M.D.DATE OF BIRTH:  07/18/1925   DATE OF CONSULTATION:  02/11/2004  DATE OF DISCHARGE:                                   CONSULTATION   Andrea Rubio is a 77 year old white female patient of [name]. Kriste Basque,  M.D., who was recently hospitalized for an evaluation of fever and failure  to thrive at home.  The patient had been having persistent cough, some  shortness of breath, low grade fever and difficulty walking.  The patient  does attribute the difficulty walking to flare up of gout.  Apparently she  has had gout in the past involving the ankles, feet and hands.  In fact she  has had evidence of tophaceous deposits in the right fourth finger over the  course of the past few months.   The patient is having difficulty with ambulation.  She is complaining of  bilateral ankle and foot pain and some discomfort in the right knee.  She  does have previous history of osteoarthritis in the knees particularly the  right knee.   The patient has been taking allopurinol 300 mg daily for the past month.  She has not been on colchicine.   The patient does have a past medical history of bilateral rotator cuff tears  with subsequent reconstructive surgery of both shoulders, hypertension,  asthma with COPD.   MEDICATIONS:  1.  Torsemide.  2.  Verapamil.  3.  Altace 10 mg daily.  4.  Labetalol.  5.  Demadex.  6.  Potassium supplement.  7.  Prednisone 5 mg daily.  8.  Premarin.  9.  Prilosec.  10. Ventolin inhaler.  11. Metoclopramide.  12. Fosamax 70 mg weekly.  13. Amitriptyline 25 mg before bed.  14. Rhinocort.   ALLERGIES:  No known drug allergies.   The patient does not smoke cigarettes or drink alcohol.   LABORATORY DATA:  Laboratory studies in the hospital revealed  a hemoglobin  of 12.5, white cell count 9800.  Creatinine 0.8, calcium 9.1, SGPT 20,  alkaline phosphatase 61.  She has been told in the past that her uric acid  has been elevated in the past.   PHYSICAL EXAMINATION:  GENERAL APPEARANCE:  The patient appears to be  healthy and well-nourished.  EXTREMITIES:  Decreased hand grip bilaterally.  Hypertrophic changes are  noted at the IP and PIP joints consistent with osteoarthritis.  In addition,  there are tophaceous deposits in the area of the right fourth PIP joint.  There are no areas of erythema in the region of that joint.  Wrist and elbow  examination revealed good mobility.  Shoulder examination revealed inability  to forward flex or adduct either shoulder beyond about 45 degrees.  The  ankle and foot examination reveals marked crepitus in the right knee without  effusion.  Left knee examination appeared normal.  Ankle and foot  examination reveals tenderness on palpation over the  first MTP joint  bilaterally as well as difficulty with motion of either ankle joint.  Muscle  strength test revealed good strength in the proximal muscle groups of the  hip and shoulder.   ASSESSMENT:  The patient has a history of gout.  Certainly she does have  tophaceous deposits developing in the finger joints.  I did recommend that  she maintain the Allopurinol at 300 mg daily.  Perhaps an increase of  prednisone to 15 mg daily for a week followed by 10 mg for another week and  then on to the 5 mg dose that she has been taking for a number of years.  Colchicine at 0.6 mg daily could be helpful to reduce pain and inflammation  that can occur over the first few months of treatment with allopurinol.  There is a shedding effect that could go on meaning that she could deposit  crystals into the joint while the uric acid levels are maintained at a lower  level.  Tophaceous deposits can last up to a year.  The patient did seem to  understand the  recommendations.  Also recommended that she maintain a good  fluid intake at all times.  She does have fairly advanced osteoarthritis of  the right knee.  I suspect this is a significant problem for her, although  the options for treating this are limited.  She may eventually require knee  replacement surgery if she is medically able to do this.      JJZ/MEDQ  D:  02/12/2004  T:  02/12/2004  Job:  [id number]

## 2010-06-20 NOTE — Op Note (Signed)
[facility]  Patient:    Andrea Rubio, Andrea Rubio Visit Number: [id number] MRN: [id number]          Service Type: SUR Location: 4W 0448 01 Attending Physician:  Loanne Drilling Dictated by:   Ollen Gross, M.D. Proc. Date: 05/11/01 Admit Date:  05/11/2001                             Operative Report  PREOPERATIVE DIAGNOSIS:   Left rotator cuff tear.  POSTOPERATIVE DIAGNOSIS:  Left rotator cuff tear.  OPERATION:  Left shoulder acromioplasty and rotator cuff repair.  SURGEON:  Ollen Gross, M.D.  ASSISTANT:  __________  ANESTHESIA:  General interscalene.  ESTIMATED BLOOD LOSS:  Minimal.  DRAINS:  None.  COMPLICATIONS:  None.  CONDITION:  Stable to recovery.  INDICATIONS:  Mr. Ronning is a 77 year old female who has had a marked increase in left shoulder pain and dysfunction in the past year or so.  She had a rotator cuff repair done several years ago and did well up until about one year ago.  She had a scan showing recurrent tear.  Given her significant pain and dysfunction, she presents now for debridement versus repair of her rotator cuff.  DESCRIPTION OF PROCEDURE:  After the successful administration of interscalene block and general anesthetic, the patient was placed sitting upright in a beach chair positioner and left upper extremity and shoulder girdle are isolated from her trunk with plastic drapes and prepped and draped in the usual sterile fashion.  She had a prior transverse incision coming from the [facility] joint toward the middle deltoid.  I have reutilized this incision with a 10 blade cutting through the skin to the subcutaneous tissue to the level of the deltoid fascia and then subcu flaps were elevated in all directions.  We then utilized the electrocautery to make an incision from the distal clavicle coursing across the anterior edge of the acromion down to the deltoid.  The deltoid fibers were split for about 1.5 cm in the  lateral edge of the acromion.  The entire soft tissue flat was subperiosteally elevated anteriorly and then posteriorly we just elevated over the clavicle.  The area was inspected and she had an excellent distal clavicle resection from her previous surgery.  There was regrowth of a small subacromial spur and we utilized the oscillating saw to create a flat undersurface of the acromion.  The rotator cuff was then visualized.  She had about a 1 x 2 cm tear of the supraspinatus. Old sutures are identified and removed.  There was some bursae overlying the substance of the tendon, and this was removed.  The overall substance appeared fairly normal.  There was some thinning of the fibers most distally and I excised these with a 15 blade.  I then created a trough with a rongeur and placed two Mitek super anchors into the trough.  The free ends of the sutures were then passed through the edge of the tendon, and the tendon was advanced down into the trough.  The sutures were tied over the tendon and we had a very stable repair.  We then cut the ends of the sutures and copiously irrigated the subacromial space.  Deltoid was reattached to the acromion through drill holes with #1 Ethibond.  The fascia over the clavicle was then imbricated with the Ethibond and the split in the deltoid also closed with the Ethibond.  The subcutaneous tissues were  then closed with interrupted 2-0 Vicryl and subcuticular running 4-0 Monocryl.  The incisions were clean and dry. Steri-Strips and a bulky sterile dressing were applied.  She was placed in the shoulder immobilizer, awakened and transported to recovery in stable condition. Dictated by:   Ollen Gross, M.D. Attending Physician:  Loanne Drilling DD:  05/11/01 TD:  05/12/01 Job: [id number]

## 2010-06-20 NOTE — H&P (Signed)
[facility]  Patient:    Andrea Rubio, TRIVETT Visit Number: [id number] MRN: [id number]          Service Type: SUR Location: 4W 0448 01 Attending Physician:  Loanne Drilling Dictated by:   Ottie Glazier Wynona Neat, P.A.-C. Admit Date:  05/11/2001 Discharge Date: 05/13/2001                           History and Physical  DATE OF BIRTH:  02/25/1925. SOCIAL SECURITY NO. [id number].  CHIEF COMPLAINT:  Left shoulder pain.  HISTORY OF PRESENT ILLNESS:  Ms. Andrea Rubio is a 77 year old white female with a long history of left shoulder pain that worsens with overhead activities.  The patient is status post a left rotator cuff repair in 1996 and, unfortunately, approximately three years ago she sustained fall and since that time has had progressing pain in the left shoulder.  She has been seen and evaluated by Dr. Lequita Halt and found to have a recurrent left rotator cuff tear.  Due to these findings, a decision is made to proceed with operative intervention in the form of an left shoulder open SADD, RCR, and possible restoration patch.  ALLERGIES:  CODEINE, which causes hallucinations; MORPHINE causes hallucinations as well.  MEDICATIONS:  1. Verapamil 240 mg one p.o. q.d.  2. Demadex 20 mg one p.o. q.d.  3. Labetalol 200 mg b.i.d.  4. Altace 10 mg one p.o. q.d.  5. K-Tabs 10 mEq two q.d.  6. Premarin 1.25 mg q.d.  7. Prednisone 5 mg one p.o. q.d.  8. Quibron T/SR tablets two times daily.  9. Ventolin inhaler four times daily. 10. Flovent inhaler two times daily. 11. Prilosec 20 mg one p.o. q.a.m. 12. Metoclopramide 10 mg one p.o. q.h.s. 13. Fosamax 70 mg once weekly. 14. Mobic 7.5 mg one in the a.m. and one at night. 15. Amitriptyline 25 mg two times daily. 16. Rhinocort inhaler as needed. 17. She takes a weekly allergy shot.  PAST MEDICAL HISTORY:  Significant for hypertension, osteoarthritis, asthmatic bronchitis, hyperlipidemia resolving with  current medications, seasonal allergies, a history of hiatal hernia with reflux.  PAST SURGICAL HISTORY:  She has had a right shoulder rotator cuff repair in 1991 as well as a right shoulder hemiarthroplasty in 2001.  She has had a left rotator cuff in 1996.  She has had a cesarean section x4 with a hysterectomy. She has had a cholecystectomy and also had sinus surgery.  SOCIAL HISTORY:  She was recently widowed.  Unfortunately, her husband deceased approximately one month ago secondary to cancer, and this is why the patient has had to reschedule her surgery.  She is a retired Runner, broadcasting/film/video.  Denies any tobacco or alcohol abuse.  Lives in a one-level home.  She has three children.  FAMILY HISTORY:  There is hypertension, coronary artery disease, and a history of strokes in the family.  REVIEW OF SYSTEMS:  GENERAL:  The patient denies any recent weight loss, night sweats, fever, or chills.  HEENT:  Denies any headache, ringing of the ears, runny nose, sore throat.  CHEST:  She denies any chronic or productive cough, no hemoptysis.  CARDIOVASCULAR:  She denies any shortness of breath, chest pains, irregular heartbeats.  GASTROINTESTINAL/GENITOURINARY:  Denies any history of diarrhea, constipation, melena, bright red stools per rectum, dysuria, frequency, or urgency.  EXTREMITIES:  Please see HPI.  NEUROLOGIC: Denies any history of seizures or strokes.  FAMILY PHYSICIAN:  Lonzo Cloud.  Kriste Basque, M.D.  PHYSICAL EXAMINATION:  VITAL SIGNS:  Her blood pressure is 140/70, pulse is 68, respirations 12.  GENERAL:  This is an extremely pleasant 77 year old white female in no acute distress.  HEENT:  She does wear corrective lenses.  Otherwise head is atraumatic, normocephalic.  NECK:  Supple without masses or carotid bruits.  CHEST:  Clear to auscultation bilaterally.  No wheezing or rhonchi.  BREASTS:  Deferred, not pertinent to present illness.  CARDIAC:  Regular rate and rhythm, S1,  S2.  ABDOMEN:  Soft, nontender, bowel sounds are positive x4 quadrants.  No guarding, no rebound.  GENITOURINARY:  Deferred, not pertinent to present illness.  EXTREMITIES:  The patient has active _____ to approximately 150 degrees, abduction to 140, external rotation to 50.  Strength is 4/5.  There is noted crepitus and pain with any overhead motion.  SKIN:  No suspicious lesions.  LABORATORY DATA:  AP and axillary of the right show that the prosthesis is in excellent position with no periprosthetic abnormalities.  The left shoulder shows adequate distal clavicle resection and acromioplasty.  There is no high-riding humeral head, no arthrosis noted.  IMPRESSION: 1. A left rotator cuff tear, recurrent in nature. 2. History of hypertension. 3. Osteoarthritis. 4. Hyperlipidemia. 5. Allergies. 6. Hiatal hernia with gastroesophageal reflux disease.  PLAN:  Patient will be admitted to [facility] to undergo a left shoulder open subacromial decompression, distal clavicle resection, rotator cuff repair, and possible restore patch per Dr. Lequita Halt on April 2 at 4:15 p.m.  The patient has had a preoperative evaluation per Dr. Alroy Dust and has been approved as a surgical candidate.  Due to the fact the patient has been on chronic steroids, she will need a stress dose preoperatively and subsequently a tapered dose postoperatively. Dictated by:   Ottie Glazier. Wynona Neat, P.A.-C. Attending Physician:  Loanne Drilling DD:  05/19/01 TD:  05/20/01 Job: [id number]

## 2010-06-25 ENCOUNTER — Ambulatory Visit (INDEPENDENT_AMBULATORY_CARE_PROVIDER_SITE_OTHER): Payer: Medicare Other | Admitting: Pulmonary Disease

## 2010-06-25 ENCOUNTER — Other Ambulatory Visit (INDEPENDENT_AMBULATORY_CARE_PROVIDER_SITE_OTHER): Payer: Medicare Other

## 2010-06-25 ENCOUNTER — Ambulatory Visit (INDEPENDENT_AMBULATORY_CARE_PROVIDER_SITE_OTHER)
Admission: RE | Admit: 2010-06-25 | Discharge: 2010-06-25 | Disposition: A | Discharge status: Home / Self Care | Payer: Medicare Other | Source: Ambulatory Visit | Attending: Pulmonary Disease | Admitting: Pulmonary Disease

## 2010-06-25 ENCOUNTER — Encounter: Payer: Self-pay | Admitting: Pulmonary Disease

## 2010-06-25 DIAGNOSIS — E785 Hyperlipidemia, unspecified: Secondary | ICD-10-CM

## 2010-06-25 DIAGNOSIS — F411 Generalized anxiety disorder: Secondary | ICD-10-CM

## 2010-06-25 DIAGNOSIS — M1990 Unspecified osteoarthritis, unspecified site: Secondary | ICD-10-CM

## 2010-06-25 DIAGNOSIS — J209 Acute bronchitis, unspecified: Secondary | ICD-10-CM

## 2010-06-25 DIAGNOSIS — I10 Essential (primary) hypertension: Secondary | ICD-10-CM

## 2010-06-25 DIAGNOSIS — M810 Age-related osteoporosis without current pathological fracture: Secondary | ICD-10-CM

## 2010-06-25 DIAGNOSIS — I359 Nonrheumatic aortic valve disorder, unspecified: Secondary | ICD-10-CM

## 2010-06-25 DIAGNOSIS — I509 Heart failure, unspecified: Secondary | ICD-10-CM

## 2010-06-25 DIAGNOSIS — K219 Gastro-esophageal reflux disease without esophagitis: Secondary | ICD-10-CM

## 2010-06-25 DIAGNOSIS — I739 Peripheral vascular disease, unspecified: Secondary | ICD-10-CM

## 2010-06-25 DIAGNOSIS — M109 Gout, unspecified: Secondary | ICD-10-CM

## 2010-06-25 DIAGNOSIS — R0609 Other forms of dyspnea: Secondary | ICD-10-CM

## 2010-06-25 DIAGNOSIS — D649 Anemia, unspecified: Secondary | ICD-10-CM

## 2010-06-25 DIAGNOSIS — I872 Venous insufficiency (chronic) (peripheral): Secondary | ICD-10-CM

## 2010-06-25 DIAGNOSIS — K5730 Diverticulosis of large intestine without perforation or abscess without bleeding: Secondary | ICD-10-CM

## 2010-06-25 LAB — CBC WITH DIFFERENTIAL/PLATELET
Eosinophils Relative: 0.2 % (ref 0.0–5.0)
HCT: 41.5 % (ref 36.0–46.0)
Monocytes Relative: 5.4 % (ref 3.0–12.0)
Neutrophils Relative %: 74.5 % (ref 43.0–77.0)
Platelets: 160 10*3/uL (ref 150.0–400.0)
WBC: 7 10*3/uL (ref 4.5–10.5)

## 2010-06-25 LAB — HEPATIC FUNCTION PANEL
ALT: 23 U/L (ref 0–35)
AST: 28 U/L (ref 0–37)
Albumin: 3.8 g/dL (ref 3.5–5.2)

## 2010-06-25 LAB — BASIC METABOLIC PANEL
BUN: 27 mg/dL — ABNORMAL HIGH (ref 6–23)
Creatinine, Ser: 0.8 mg/dL (ref 0.4–1.2)
GFR: 70.71 mL/min (ref >60.00)
Glucose, Bld: 169 mg/dL — ABNORMAL HIGH (ref 70–99)
Potassium: 4.1 mEq/L (ref 3.5–5.1)

## 2010-06-25 LAB — IBC PANEL: Saturation Ratios: 14.6 % — ABNORMAL LOW (ref 20.0–50.0)

## 2010-06-25 LAB — TSH: TSH: 0.74 u[IU]/mL (ref 0.35–5.50)

## 2010-06-25 LAB — BRAIN NATRIURETIC PEPTIDE: Pro B Natriuretic peptide (BNP): 146 pg/mL — ABNORMAL HIGH (ref 0.0–100.0)

## 2010-06-25 MED ORDER — FLUTICASONE-SALMETEROL 250-50 MCG/DOSE IN AEPB: 1.0000 | INHALATION_SPRAY | Freq: Two times a day (BID) | RESPIRATORY_TRACT | Status: DC

## 2010-06-25 NOTE — Progress Notes (Signed)
Subjective:    Patient ID: Andrea Rubio, female    DOB: 10/16/1925, 77 y.o.   MRN: [id number]  HPI 77 y/o WF here for a follow up visit... she has multiple medical problems as noted below...   ~  May11:  she saw TP 3/11 w/ skin tear left forearm from near fall- treated in wound care clinic & improved... labs showed Hg=12.5 but Fe=22  therefore started on FeSO4 Bid but she still feels as though her iron is low, she says> discussed checking labs again today (Hg=13, Fe=57, sat=23%)... otherw stable- breathing is OK using nebs, Advair, Pred... BP controlled on meds; no ch in dizziness, denies syncope etc;  severe arthritic changes and able to make it at home w/ help of her son... ~  Sep11:  she has severe DJD/ Gout on Uloric40 w/ Uric=3.2 May11... c/o bilat leg pain & known lumbar disc dis followed by Susann Givens & needs f/u appt & reassessment (prev shots in back neck knees w/o much help)... she doesn't want Codeine/ Vicodin so we will Rx w/ Tramadol/ Tylenol in the interim... otherw stable & managing at home w/ son's help- breathing stable w/o exac;  BP controlled on meds;  OK Flu shot today.  ~  February 19, 2010:  4 mo ROV- c/o incr urination on the Torsemide 20mg /d so she cut it back on her own to 1/2 tab daily but notes incr swelling & BNP= 1445> we discussed need to incr back to 20mg /d... also c/o "hard feeling" in abd- denies n/v, d/c, or blood & we discussed need for further eval w/ XRay Abd & poss CT vs eval by GI if nec (XRay shows stool throughout colon- needs lax Rx first w/ Miralax & Senakot-S daily)... Labs show Hg= 13.0, MCV= 96, but Fe= 13 (TIBC=140, Sat=7%) & we will refer to GI for further eval...  ~  Jun 25, 2010:  52mo ROV & post hosp check> she was Opp x2 in Jan2012 by [facility] w/ Diverticulitis, Dehydration (renal insuffic, incr K+, UTI), severe AS & DiastolicCHF>  They consulted DrKadakia for Cards & she has been to his office "he said everything was fine"...  She went to a NH for rehab x33mo  & has been home x37mo now> ambulating w/ walker & having difficulty w/ legs giving out- getting home therapy thru [place]... [facility] records reviewed in detail; we don't have notes from [place]... She requests a new rolling walker- OK...  CXR today shows cardiomeg, sm right effusion, no edema, bilat shoulder arthroplasties, NAD... Labs look pretty good> see below:         Problem List:  MACULAR DEGENERATION (ICD-362.50)  SINUSITIS, CHRONIC (ICD-473.9) - she is s/p bliat [facility] 1986 by DrKraus... ~  CT Sinus 11/09 showed chr sinusitis changes in the max & sphenoid regions...  ASTHMATIC BRONCHITIS, ACUTE (ICD-466.0) - on PRED 5mg /d, ADVAIR 250Bid, & ALBUTEROL NEBS Tid... stable- min cough; w/o sputum, change in DOE, etc... ~  PFT's 3/06 showed FVC= 1.49 (68%), FEV1= 1.12 (75%), %1sec=75, mid-flows= 43%pred... ~  [facility] 8/09 w/ bilat pneumonia... see DC Summary. ~  CXR 10/09 showed stable cardiomeg, tort Ao, clear lungs, bilat shoulder arthroplasties... ~  CT Angio Chest 11/09 showed neg for PE, fluid/ mucus plugs in depend LL bronchi... ~  CXR 10/10 showed stable cardiomeg, bilat humeral head prostheses, NAD.Marland Kitchen. ~  CXR 1/12 showed COPD, chr changes, ?right basilar opacity ==> subsequent films w/ incr edema. ~  CXR 5/12 showed cardiomeg, sm right effusion,  no edema, bilat shoulder arthroplasties, NAD...  HYPERTENSION (ICD-401.9) - on LISINOPRIL 5mg /d, FUROSEMIDE 40mg /d & K20/d...  her BP's have stabilized at home and measures 102/68 here today... she denies CP, palpit, syncope, ch in edema...  AORTIC STENOSIS (ICD-424.1)   << followed by [id number] for Cards >> Hx of CHF (ICD-428.0)  ~  2DEcho 3/06 showed mild AS/AI w/ mod incr AoV thickness & reduced leaflet excursion, mild MR, norm EF=55-65%...  ~  2DEcho 12/08 showed mod inc AoV thickness, mod reduced AV leaflet excursion, c/w mod AS, mild AI & mean grad= ;  norm LVF w/ EF= 65% and no wall motion abn etc... ~  2DEcho 1/12 in  [place] showed severeAS & mildAI, mild conc LVH, norm sys func w/ EF=60-65%, HK at apex, grI DD... ~  EKG 1/12 showed NSR, poor R progression, NSSTTWA...  PERIPHERAL VASCULAR DISEASE (ICD-443.9) - on ASA 81mg /d... she can't walk enough to elicit discomfort, no rest pain or lesions... ~  ABI's 12/01 showed .88 on R, and .95 on L... ~  MRI showed small vessel dis & small infarct noted...  VENOUS INSUFFICIENCY (ICD-459.81) - on low sodium diet, Lasix, KCl...  HYPERLIPIDEMIA (ICD-272.4) - now on ZOCOR 40mg  per DrKadakia (prev on Pravachol, but stated intol to other Statins)... takes Fish Oil ~2000mg /d rec by her ophthalmologist... ~  FLP 9/05 ?on Prav40 showed TChol 176, TG 190, HDL 55, LDL 83... ~  FLP 4/10 on diet alone showed TChol 211, TG 153, HDL 44, LDL 128... she refuses med Rx- continue diet efforts. ~  1/12:  Hosp [facility] & consulted DrKadakia who started her on ZOCOR40mg /d> he is checking her labs.  HIATAL HERNIA (ICD-553.3) - on PRILOSEC 20mg /d & METACLOPRAMIDE 10mg Qhs... GERD (ICD-530.81) - EGD 3/06 by DrStark showed 3cmHH, mild esophagitis...  DIVERTICULOSIS, COLON (ICD-562.10) - last colonoscopy was 6/06 by [facility] showed divertics and ischemic colitis...  ~  Adm 1/12 by [facility] w/ Diverticulitis, treated & improved, she has not followed up w/ GI; she take 1/2 capful of MIRALAX daily.  STRESS INCONTINENCE (ICD-788.39) - she has hx UTI's on & off... ~  10/10: urine c/s +EColi & Rx'd w/ Cipro.  DEGENERATIVE JOINT DISEASE (ICD-715.90) - she has severe DJD & Gout w/ deformities- Foot XRay showed Charcot foot deformity, severe degenerative dis, etc...  added TRAMADOL + TYLENOL Prn... GOUT, UNSPECIFIED (ICD-274.9) - labs 4/10 showed Uric Acid level= 7.9.Marland KitchenMarland Kitchen she has hx of gout & hyperuricemia but allergic to Allopurinol w/ rash... therefore on ULORIC 40mg /d... Uric Acid level 5/11 on Uloric40 = 3.2 LOW BACK PAIN (ICD-724.2) NECK PAIN (ICD-723.1) - eval by Susann Givens ==> DrLewitt 2010 w/ MRI & Rx  w/  Lidoderm, Tylenol, Lyrica- now on NEURONTIN... OSTEOPOROSIS (ICD-733.00) - prev on Fosamax (stopped 1/12 in hosp "my kidneys can't handle it"); on Ca++, Vits, & Vit D... ~  labs 4/10 showed Vit D level = 28... rec> start Vit D OTC 1000 u daily. ~  labs 5/11 showed Vit D level = 32... rec incr Vit D to 2000 u daily.  ANXIETY (ICD-300.00) - off prev Alpraz Rx & takes AMITRIPTYLINE 25mg  Qhs... under stress w/ 72 y/o son had MI & stent in 2010.  Hx of ANEMIA (ICD-285.9) - hx anemia req 2U transfusion 6/06 hosp for ischemic colitis...  ~  Hg= 12 - 13 over 2008-9. ~  labs 8/09 hosp showed Hg= 14 ~  labs 4/10 showed Hg= 12.2 ~  labs 3/11 showed Hg= 12.5, MCV= 92, Fe= 22 (9%sat)... start FeSO4  Bid. ~  labs 5/11 showed = 13.0, MCV= 94, Fe= 57 (23%)... continue Fe supplement.  DERM>  She has skin cancer removed from the right side of her face by DrDanJones [phone])...   Past Surgical History  Procedure Date  . Lumbar laminectomy   . Shoulder surgery     bilateral    Outpatient Encounter Prescriptions as of 06/25/2010  Medication Sig Dispense Refill  . albuterol (PROVENTIL) (2.5 MG/3ML) 0.083% nebulizer solution Take 2.5 mg by nebulization 3 (three) times daily as needed.        Marland Kitchen amitriptyline (ELAVIL) 25 MG tablet Take 25 mg by mouth at bedtime.        Marland Kitchen aspirin 81 MG tablet Take 81 mg by mouth daily.        . Calcium Carbonate-Vitamin D (CALCIUM 600+D) 600-400 MG-UNIT per tablet Take 1 tablet by mouth 2 (two) times daily.        . Cholecalciferol (VITAMIN D) 1000 UNITS capsule Take 1,000 Units by mouth daily.        . clotrimazole-betamethasone (LOTRISONE) cream Apply topically 2 (two) times daily.        . febuxostat (ULORIC) 40 MG tablet Take 40 mg by mouth daily.        . ferrous gluconate (FERGON) 325 MG tablet Take 325 mg by mouth 2 (two) times daily with a meal.        . Fluticasone-Salmeterol (ADVAIR DISKUS) 250-50 MCG/DOSE AEPB Inhale 1 puff into the lungs every 12 (twelve)  hours.  60 each  11  . furosemide (LASIX) 40 MG tablet Take 40 mg by mouth daily.        Marland Kitchen gabapentin (NEURONTIN) 100 MG capsule Take 100 mg by mouth as directed. By Dr Vela Prose       . ipratropium (ATROVENT) 0.06 % nasal spray 2 sprays by Nasal route 3 (three) times daily.        Marland Kitchen lisinopril (PRINIVIL,ZESTRIL) 5 MG tablet TAKE 1 TABLET ONCE DAILY.  30 tablet  11  . metoCLOPramide (REGLAN) 10 MG tablet Take 1 tablet (10 mg total) by mouth at bedtime.  30 tablet  1  . Multiple Vitamin (MULTIVITAMIN) capsule Take 1 capsule by mouth daily.        Marland Kitchen omeprazole (PRILOSEC) 20 MG capsule Take 20 mg by mouth daily.        . polyethylene glycol powder (GLYCOLAX/MIRALAX) powder Take 17 g by mouth daily.        . potassium chloride SA (K-DUR,KLOR-CON) 20 MEQ tablet Take 20 mEq by mouth daily.        . predniSONE (DELTASONE) 5 MG tablet Take 5 mg by mouth daily.        . simvastatin (ZOCOR) 40 MG tablet Take 40 mg by mouth at bedtime.        . traMADol (ULTRAM) 50 MG tablet Take 50 mg by mouth every 6 (six) hours as needed. (May take with Tylenol)         Allergies  Allergen Reactions  . Allopurinol     REACTION: rash  . Codeine   . Hydrocodone     REACTION: hallucinations  . Morphine     REACTION: hallucinations    Review of Systems        See HPI - all other systems neg except as noted... The patient complains of dyspnea on exertion, peripheral edema, abdominal pain, severe indigestion/heartburn, muscle weakness, and difficulty walking.  The patient denies anorexia, fever, weight loss, weight  gain, vision loss, decreased hearing, hoarseness, chest pain, syncope, prolonged cough, headaches, hemoptysis, melena, hematochezia, hematuria, incontinence, suspicious skin lesions, transient blindness, depression, unusual weight change, abnormal bleeding, enlarged lymph nodes, and angioedema.     Objective:   Physical Exam    WD, Petite, sl overweight, 77 y/o WF in NAD... she is <5' tall, weight  140# GENERAL:  Alert & oriented; pleasant & cooperative... HEENT:  [place]/AT, EACs-clear, TMs-wnl, NOSE-clear, THROAT-clear & wnl... known macular degen per ophthal... NECK:  Supple w/ decrROM; no JVD; normal carotid impulses w/o bruits; no thyromegaly or nodules palpated; no lymphadenopathy. CHEST:  Decr BS bilat, Coarse BS, no wheezing or rhonchi... HEART:  Regular Rhythm;  gr 1-2/6 SEM without rubs or gallops detected... ABDOMEN:  Soft & nontender; normal bowel sounds; no organomegaly or masses palpated... EXT: +hand deformities, w/ tophi & mod arthritic changes & contractures... right knee arthritis & severe foot deformities as well. no varicose veins/+venous insuffic/ tr edema. NEURO:  CN's intact x reduced vision... diffusely weak, without focal changes... +gait abn... DERM:  no lesions- ecchymoses, thin skin, etc...   Assessment & Plan:   AB>  Breathing is stable & she has been able to avoid URIs etc; continue Pred, Advair, Nebs.  HBP & Diastolic CHF>  Controlled on meds- Lisinopril & Lasix; continue same.  AS>  Followed by [facility] for her severe AS, not a surg candidate...  HYPERLIPID>  On Simva40 per Cards & they follow her labs...  GI>  Hx HH, GERD, Divertics, Colitis>  On Prilosec, Reglan 10mg  Qhs (she does not want to stop this med), & Miralax as noted...  DJD/ Gout/ Neck&Back Pain>  As noted- on Tramadol, Tylenol, Uloric...  Anxiety>  She is glad to be back home, has [place] home health help.Marland KitchenMarland Kitchen

## 2010-06-25 NOTE — Patient Instructions (Signed)
Today we updated your med list in EPIC...    Continue your current meds the same for now...  Today we did your follow up CXR & blood work...    Please call the PHONE TREE in a few days for your results...    Dial [id number] & when prompted enter your patient number followed by the # symbol...    Your patient number is:  [id number]#  Continue your home therapy... We wrote a new Rx for a rolling walker... Call for any questions... Let's plan a follow up visit in 3 months, sooner for any problems.Marland KitchenMarland Kitchen

## 2010-06-29 ENCOUNTER — Encounter: Payer: Self-pay | Admitting: Pulmonary Disease

## 2010-07-04 ENCOUNTER — Telehealth: Payer: Self-pay | Admitting: Pulmonary Disease

## 2010-07-04 MED ORDER — PREDNISONE 5 MG PO TABS: 5.0000 mg | ORAL_TABLET | Freq: Every day | ORAL | Status: DC

## 2010-07-04 NOTE — Telephone Encounter (Signed)
Pt advised she needs refills on Prednisone 5mg . I sent Rx to [facility] electronically.

## 2010-07-14 ENCOUNTER — Telehealth: Payer: Self-pay | Admitting: Pulmonary Disease

## 2010-07-14 NOTE — Telephone Encounter (Signed)
noted 

## 2010-07-28 ENCOUNTER — Telehealth: Payer: Self-pay | Admitting: Pulmonary Disease

## 2010-07-28 MED ORDER — CIPROFLOXACIN HCL 250 MG PO TABS: 250.0000 mg | ORAL_TABLET | Freq: Two times a day (BID) | ORAL | Status: AC

## 2010-07-28 NOTE — Telephone Encounter (Signed)
Pt advised of recs per TP. Rx sent. Carron Curie, CMA

## 2010-07-28 NOTE — Telephone Encounter (Signed)
Pt called back again. Wants to hear from nurse asap- [facility]. Hazel Sams

## 2010-07-28 NOTE — Telephone Encounter (Signed)
Spoke with pt. She is c/o urinary frequency, painful urination since noon today. Denies any fever, low back pain or pelvic pain. Would like SN to call in abx. Please advise, thanks Allergies  Allergen Reactions  . Allopurinol     REACTION: rash  . Codeine   . Hydrocodone     REACTION: hallucinations  . Morphine     REACTION: hallucinations

## 2010-07-28 NOTE — Telephone Encounter (Signed)
>>  last UTI was 02/2010 w/ ecoli that was pansensitive.   Increase fluid intake  Cipro 250mg  Twice daily  For 7days #14 , no refills  Go over healthy urinary hygiene.  Please contact office for sooner follow up if symptoms do not improve or worsen or seek emergency care

## 2010-07-30 ENCOUNTER — Other Ambulatory Visit: Payer: Self-pay | Admitting: Pulmonary Disease

## 2010-07-31 NOTE — Telephone Encounter (Signed)
Andrea Rubio CALLED REGARDING PRESCRIPTION AND WANTS TO KNOW WHY IT HAS NOT BEEN FILLED.  PLEASE CALL BACK AT [phone]

## 2010-07-31 NOTE — Telephone Encounter (Signed)
This rx has been sent back to the pharmacy this morning.  thanks

## 2010-07-31 NOTE — Telephone Encounter (Signed)
I spoke to pharmacists because pt has refills for prednisone and they state they that they gave her 2 months at las refill so she should not be out, so I called the pt and she states that Dr. Kriste Basque told her she could take 2 daily for increased chest tightness or wheezing so she has had to do that at times which is why she is running out. So I advised the pharmacy of this and they will provide pt with a refill. Carron Curie, CMA

## 2010-07-31 NOTE — Telephone Encounter (Signed)
Spouse called back- says rx hadn't been called in to [facility] yet (for prednisone). Andrea Rubio

## 2010-08-04 ENCOUNTER — Other Ambulatory Visit: Payer: Self-pay | Admitting: Pulmonary Disease

## 2010-08-07 ENCOUNTER — Telehealth: Payer: Self-pay | Admitting: Pulmonary Disease

## 2010-08-07 ENCOUNTER — Other Ambulatory Visit (INDEPENDENT_AMBULATORY_CARE_PROVIDER_SITE_OTHER): Payer: Medicare Other

## 2010-08-07 DIAGNOSIS — N390 Urinary tract infection, site not specified: Secondary | ICD-10-CM

## 2010-08-07 LAB — URINALYSIS, ROUTINE W REFLEX MICROSCOPIC
Bilirubin Urine: NEGATIVE
Hgb urine dipstick: NEGATIVE
Nitrite: NEGATIVE
pH: 7 (ref 5.0–8.0)

## 2010-08-07 MED ORDER — CIPROFLOXACIN HCL 250 MG PO TABS: 250.0000 mg | ORAL_TABLET | Freq: Two times a day (BID) | ORAL | Status: DC

## 2010-08-07 NOTE — Telephone Encounter (Signed)
Called, spoke with pt.  She was informed of SN's recs and verbalized understanding of this.  States she has to come to town this evening with her son for an appt -- she will come by lab around 3pm to leave urine speciman. She is aware we will call back with these results.

## 2010-08-07 NOTE — Telephone Encounter (Signed)
Per SN----we can send in cipro 250mg   #14   1 po bid .  Called and spoke with pt and she is aware of this.

## 2010-08-07 NOTE — Telephone Encounter (Signed)
Called and spoke with pt and she stated that her urine was a bright yellow and lots of burning when she goes.  She has to go to the bathroom every 20-30 minutes. Pt is requesting that something be called in today for her.  Please advise. thanks

## 2010-08-07 NOTE — Telephone Encounter (Signed)
Called, spoke with pt.  She called in on 07/28/10 for urinary frequency and painful urination.  She was told to:   Increase fluid intake  Cipro 250mg  Twice daily For 7days #14 , no refills  Go over healthy urinary hygiene.  Please contact office for sooner follow up if symptoms do not improve or worsen or seek emergency care   Pt states she did start the cipro but stopped it after 2-3 days bc symptoms improved. However, after stopping the cipro, symptoms returned so pt finished taking the abx but after finishing it, she is still having urinary frequency and tingling/buring when urinating.  Pt states she tried to refill the cipro at the pharm but was told she would need to make an appt and was told she had one here on tomorrow at 3pm.  I advised pt I do not see anything in the system regarding an appt here tomorrow.  She states she is unable to make it in tomorrow anyway d/t transportation issues and requesting to see someone this evening.  Pls advise.  Thanks!

## 2010-08-07 NOTE — Telephone Encounter (Signed)
Per SN----please have her son bring in a clean catch urine asap for ua and culture.   thanks

## 2010-08-08 LAB — URINE CULTURE: Colony Count: 10000

## 2010-08-11 ENCOUNTER — Telehealth: Payer: Self-pay | Admitting: Pulmonary Disease

## 2010-08-11 NOTE — Progress Notes (Signed)
Quick Note:  Called spoke with patient, advised of lab results / recs as stated by SN. Pt will continue the cipro given on 7.5.12 and increase fluids. Pt verbalized her understanding and denied any questions. ______

## 2010-08-11 NOTE — Telephone Encounter (Signed)
OV note from 02/2010 faxed. Elease Hashimoto aware.

## 2010-08-11 NOTE — Telephone Encounter (Signed)
lmomtcb x 1 for Andrea Rubio

## 2010-08-11 NOTE — Telephone Encounter (Signed)
Andrea Rubio FROM [facility] RETURNED CALL.  PLEASE CALL BACK AT [phone]

## 2010-08-12 ENCOUNTER — Telehealth: Payer: Self-pay | Admitting: *Deleted

## 2010-08-12 NOTE — Telephone Encounter (Signed)
Called, spoke with pt. States the prednisone tablets are 5 mg.  She is taking 2 tablets once daily.  States she has been taking it like this "for a long time." TP, pls advise.  Thanks!

## 2010-08-12 NOTE — Telephone Encounter (Signed)
A fax was received for Prednisone 5 mg tabs with a question on how the pt takes this medication.  On Sn last OV note on 06/25/2010, under asthmatic bronchitis, it says once daily.  Pt has been getting #60 and taking 2 of the 5 mg tabs daily.  Pls help clarify how much Prednisone pt should be taking daily.

## 2010-08-12 NOTE — Telephone Encounter (Signed)
Cont w/ current rx of prednisone 5mg   2 po daily #60 , 5 refills

## 2010-08-12 NOTE — Telephone Encounter (Signed)
I called the pharmacy and clarified rx. Carron Curie, CMA

## 2010-08-12 NOTE — Telephone Encounter (Signed)
ATC pt's home # - line busy x 3.  WCB.

## 2010-08-12 NOTE — Telephone Encounter (Signed)
Please call pt and clarify what her dose is and if it matches Dr. Kriste Basque 's note  And then we will decide

## 2010-08-13 ENCOUNTER — Telehealth: Payer: Self-pay | Admitting: Pulmonary Disease

## 2010-08-13 DIAGNOSIS — N390 Urinary tract infection, site not specified: Secondary | ICD-10-CM

## 2010-08-13 NOTE — Telephone Encounter (Signed)
Spoke with pt. She states that despite taking cipro with only 2 doses left, still having painful and frequent urination. I advised will need ov with labs before appt. She is sched to see TP tomorrow at 11:15 am and I advised that she arrive 15 min prior and go to the lab to leave urine sample. Pt verbalized understanding and denied any questions.

## 2010-08-14 ENCOUNTER — Encounter: Payer: Self-pay | Admitting: Adult Health

## 2010-08-14 ENCOUNTER — Other Ambulatory Visit (INDEPENDENT_AMBULATORY_CARE_PROVIDER_SITE_OTHER): Payer: Medicare Other

## 2010-08-14 ENCOUNTER — Other Ambulatory Visit: Payer: Self-pay | Admitting: Pulmonary Disease

## 2010-08-14 ENCOUNTER — Ambulatory Visit (INDEPENDENT_AMBULATORY_CARE_PROVIDER_SITE_OTHER): Payer: Medicare Other | Admitting: Adult Health

## 2010-08-14 VITALS — BP 110/72 | HR 74 | Temp 98.0°F | Ht 59.0 in | Wt 132.0 lb

## 2010-08-14 DIAGNOSIS — N390 Urinary tract infection, site not specified: Secondary | ICD-10-CM

## 2010-08-14 DIAGNOSIS — R7309 Other abnormal glucose: Secondary | ICD-10-CM

## 2010-08-14 DIAGNOSIS — R739 Hyperglycemia, unspecified: Secondary | ICD-10-CM

## 2010-08-14 LAB — URINALYSIS, ROUTINE W REFLEX MICROSCOPIC
Hgb urine dipstick: NEGATIVE
Nitrite: NEGATIVE
Urobilinogen, UA: 0.2 (ref 0.0–1.0)

## 2010-08-14 NOTE — Assessment & Plan Note (Signed)
Recurrent UTI  Will send urine cx today  Will refer to urology for consult   Plan:  Avoid baths  Use hypoallergenic soaps.  Cotton underwear.  Fluids Empty bladder completely  Wipe front to back.  We are referring you to Urology to evaluate  Please contact office for sooner follow up if symptoms do not improve or worsen or seek emergency care

## 2010-08-14 NOTE — Progress Notes (Signed)
Subjective:    Patient ID: Andrea Rubio, female    DOB: [birth year], 77 y.o.   MRN: [id number]  HPI 77 yo WF with known hx of Asthmatic bronchitis , HTN, PVD, and severe DJD   ~ May11: she saw TP 3/11 w/ skin tear left forearm from near fall- treated in wound care clinic & improved... labs showed Hg=12.5 but Fe=22 therefore started on FeSO4 Bid but she still feels as though her iron is low, she says> discussed checking labs again today (Hg=13, Fe=57, sat=23%)... otherw stable- breathing is OK using nebs, Advair, Pred... BP controlled on meds; no ch in dizziness, denies syncope etc; severe arthritic changes and able to make it at home w/ help of her son...   ~ Sep11: she has severe DJD/ Gout on Uloric40 w/ Uric=3.2 May11... c/o bilat leg pain & known lumbar disc dis followed by Susann Givens & needs f/u appt & reassessment (prev shots in back neck knees w/o much help)... she doesn't want Codeine/ Vicodin so we will Rx w/ Tramadol/ Tylenol in the interim... otherw stable & managing at home w/ son's help- breathing stable w/o exac; BP controlled on meds; OK Flu shot today.   ~ February 19, 2010: 4 mo ROV- c/o incr urination on the Torsemide 20mg /d so she cut it back on her own to 1/2 tab daily but notes incr swelling & BNP= 1445> we discussed need to incr back to 20mg /d... also c/o "hard feeling" in abd- denies n/v, d/c, or blood & we discussed need for further eval w/ XRay Abd & poss CT vs eval by GI if nec (XRay shows stool throughout colon- needs lax Rx first w/ Miralax & Senakot-S daily)... Labs show Hg= 13.0, MCV= 96, but Fe= 13 (TIBC=140, Sat=7%) & we will refer to GI for further eval...   ~08/14/2010 Acute OV  PT complains of frequent UTI over last 6 months. Has had 4 UTI , increased urinary symptoms with incontinence, urgency and dysuria over last several months. She has been treated 3 times with cipro in last 6 months . Complains of  increased urination and frequency. Was called in round of  cipro  last week for UTI like symptoms. Finished this this am with some help. Symptoms went away but this am when she urinated it burned with urination. No back pain or fever. Urine data reviewed . Once culture did return with 65k e. Coli which was pan sensiitive. 2 other cultures showed no significant growth. We have sent urine cx today . NO polydipsia.  Blood sugars in past have been borderline.     Current Problems:  MACULAR DEGENERATION (ICD-362.50)   SINUSITIS, CHRONIC (ICD-473.9) - she is s/p bliat [facility] surg 1986 by DrKraus...  ~ CT Sinus 11/09 showed chr sinusitis changes in the max & sphenoid regions...   ASTHMATIC BRONCHITIS, ACUTE (ICD-466.0) - on PRED 5mg /d, ADVAIR 250Bid, & XOPENEX NEBS Tid... s ~ PFT's 3/06 showed FVC= 1.49 (68%), FEV1= 1.12 (75%), %1sec=75, mid-flows= 43%pred...  ~ [facility] 8/09 w/ bilat pneumonia... see DC Summary.  ~ CXR 10/09 showed stable cardiomeg, tort Ao, clear lungs, bilat shoulder arthroplasties...  ~ CT Angio Chest 11/09 showed neg for PE, fluid/ mucus plugs in depend LL bronchi...  ~ CXR 10/10 showed stable cardiomeg, bilat humeral head prostheses, NAD.Marland Kitchen.  ~ CXR 1/12 showed COPD, chr changes, ?right basilar opacity ==> subsequent films w/ incr edema.   HYPERTENSION (ICD-401.9) - on VERAPAMIL 240mg /d, DEMEDEX 20mg /d & K20/d... she was prev on Avapro but it  was too $$$ and she stopped it- BP's have been OK off this med... her BP's have stabilized at home and measures 110/60 here today... she denies CP, palpit, syncope, ch in edema...   AORTIC STENOSIS (ICD-424.1) & Hx of CHF (ICD-428.0)- weight = 140#, stable... VI w/ chr edema 1+...  ~ 2DEcho 3/06 showed mild AS/AI w/ mod incr AoV thickness & reduced leaflet excursion, mild MR, norm EF=55-65%...  ~ 2DEcho 12/08 showed mod inc AoV thickness, mod reduced AV leaflet excursion, c/w mod AS, mild AI & mean grad= ; norm LVF w/ EF= 65% and no wall motion abn etc...   PERIPHERAL VASCULAR DISEASE (ICD-443.9)  - on ASA 81mg /d... she can't walk enough to elicit discomfort, no rest pain or lesions...  ~ ABI's 12/01 showed .88 on R, and .95 on L...  ~ MRI showed small vessel dis & small infarct noted...   VENOUS INSUFFICIENCY (ICD-459.81) - on low sodium diet, Demedex, KCl...   HYPERLIPIDEMIA (ICD-272.4) - on diet alone... prev on Pravachol, but intol to other Statins... takes Fish Oil ~2000mg /d rec by her ophthalmologist...  ~ FLP 9/05 ?on Prav40 showed TChol 176, TG 190, HDL 55, LDL 83...  ~ FLP 4/10 showed TChol 211, TG 153, HDL 44, LDL 128... she refuses med Rx- continue diet efforts.  ~ it's hard to get her in for fasting labs...   HIATAL HERNIA (ICD-553.3) - on PRILOSEC 20mg /d & METACLOPRAMIDE 10mg Qhs...   GERD (ICD-530.81) - EGD 3/06 by DrStark showed 3cmHH, mild esophagitis...   DIVERTICULOSIS, COLON (ICD-562.10) - last colonoscopy was 6/06 by [facility] showed divertics and ischemic colitis...   STRESS INCONTINENCE (ICD-788.39) - she has hx UTI's on & off...  ~ 10/10: urine c/s +EColi & Rx'd w/ Cipro.   DEGENERATIVE JOINT DISEASE (ICD-715.90) - she has severe DJD & Gout w/ deformities- Foot XRay showed Charcot foot deformity, severe degenerative dis, etc... added TRAMADOL + TYLENOL Prn...   GOUT, UNSPECIFIED (ICD-274.9) - labs 4/10 showed Uric Acid level= 7.9.Marland KitchenMarland Kitchen she has hx of gout & hyperuricemia but allergic to Allopurinol w/ rash... therefore on ULORIC 40mg /d... Uric Acid level 5/11 on Uloric40 = 3.2   LOW BACK PAIN (ICD-724.2)   NECK PAIN (ICD-723.1) - eval by Susann Givens ==> DrLewitt 2010 w/ MRI & Rx w/ Lidoderm, Tylenol, Lyrica- now on NEURONTIN...   OSTEOPOROSIS (ICD-733.00) - on FOSAMAX 70mg /wk, Ca++, Vits, & Vit D...  ~ labs 4/10 showed Vit D level = 28... rec> start Vit D OTC 1000 u daily.  ~ labs 5/11 showed Vit D level = 32... rec incr Vit D to 2000 u daily.   ANXIETY (ICD-300.00) - on ALPRAZOLAM 0.25mg  Tid Prn... under stress w/ 53 y/o son had MI & stent in 2010.   Hx of ANEMIA  (ICD-285.9) - hx anemia req 2U transfusion 6/06 hosp for ischemic colitis...  ~ Hg= 12 - 13 over 2008-9.  ~ labs 8/09 hosp showed Hg= 14  ~ labs 4/10 showed Hg= 12.2  ~ labs 3/11 showed Hg= 12.5, MCV= 92, Fe= 22 (9%sat)... start FeSO4 Bid.  ~ labs 5/11 showed = 13.0, MCV= 94, Fe= 57 (23%)... continue Fe supplement.     Review of Systems Constitutional:   No  weight loss, night sweats,  Fevers, chills, fatigue, or  lassitude.  HEENT:   No headaches,  Difficulty swallowing,  Tooth/dental problems, or  Sore throat,                No sneezing, itching, ear ache, nasal congestion, post  nasal drip,   CV:  No chest pain,  Orthopnea, PND, swelling in lower extremities, anasarca, dizziness, palpitations, syncope.   GI  No heartburn, indigestion, abdominal pain, nausea, vomiting, diarrhea, change in bowel habits, loss of appetite, bloody stools.   Resp: No shortness of breath with exertion or at rest.  No excess mucus, no productive cough,  No non-productive cough,  No coughing up of blood.  No change in color of mucus.  No wheezing.  No chest wall deformity  Skin: no rash or lesions.  GU: + dysuria, change in color of urine, + urgency / frequency.  No flank pain, no hematuria   MS:  No joint  swelling.    Psych:  No change in mood or affect. No depression or anxiety.  No memory loss.         Objective:   Physical Exam GEN: A/Ox3; pleasant , NAD, frail and elderly in wheelchair   HEENT:  [place]/AT,  EACs-clear, TMs-wnl, NOSE-clear, THROAT-clear, no lesions, no postnasal drip or exudate noted.   NECK:  Supple w/ fair ROM; no JVD; normal carotid impulses w/o bruits; no thyromegaly or nodules palpated; no lymphadenopathy.  RESP  Coarse BS  w/o, wheezes/ rales/ or rhonchi.no accessory muscle use, no dullness to percussion  CARD:  RRR, no m/r/g  , no peripheral edema, pulses intact, no cyanosis or clubbing.  GI:   Soft & nt; nml bowel sounds; no organomegaly or masses detected. Neg CVA  tenderness   Musco: Warm bil, no deformities or joint swelling noted. Severe arthritic changes of the hand   Neuro: alert, no focal deficits noted.    Skin: Warm, no lesions or rashes         Assessment & Plan:

## 2010-08-14 NOTE — Patient Instructions (Signed)
Avoid baths  Use hypoallergenic soaps.  Cotton underwear.  Fluids Empty bladder completely  Wipe front to back.  We are referring you to Urology to evaluate  Please contact office for sooner follow up if symptoms do not improve or worsen or seek emergency care

## 2010-08-15 ENCOUNTER — Other Ambulatory Visit: Payer: Self-pay | Admitting: Pulmonary Disease

## 2010-08-15 LAB — URINE CULTURE

## 2010-08-19 ENCOUNTER — Other Ambulatory Visit: Payer: Self-pay | Admitting: Pulmonary Disease

## 2010-09-09 ENCOUNTER — Other Ambulatory Visit: Payer: Self-pay | Admitting: Pulmonary Disease

## 2010-09-17 ENCOUNTER — Other Ambulatory Visit: Payer: Self-pay | Admitting: Pulmonary Disease

## 2010-09-24 ENCOUNTER — Ambulatory Visit (INDEPENDENT_AMBULATORY_CARE_PROVIDER_SITE_OTHER): Payer: Medicare Other | Admitting: Pulmonary Disease

## 2010-09-24 ENCOUNTER — Encounter: Payer: Self-pay | Admitting: Pulmonary Disease

## 2010-09-24 DIAGNOSIS — J209 Acute bronchitis, unspecified: Secondary | ICD-10-CM

## 2010-09-24 DIAGNOSIS — J329 Chronic sinusitis, unspecified: Secondary | ICD-10-CM

## 2010-09-24 DIAGNOSIS — I359 Nonrheumatic aortic valve disorder, unspecified: Secondary | ICD-10-CM

## 2010-09-24 DIAGNOSIS — I872 Venous insufficiency (chronic) (peripheral): Secondary | ICD-10-CM

## 2010-09-24 DIAGNOSIS — N39498 Other specified urinary incontinence: Secondary | ICD-10-CM

## 2010-09-24 DIAGNOSIS — I10 Essential (primary) hypertension: Secondary | ICD-10-CM

## 2010-09-24 DIAGNOSIS — I739 Peripheral vascular disease, unspecified: Secondary | ICD-10-CM

## 2010-09-24 DIAGNOSIS — E785 Hyperlipidemia, unspecified: Secondary | ICD-10-CM

## 2010-09-24 DIAGNOSIS — M545 Low back pain, unspecified: Secondary | ICD-10-CM

## 2010-09-24 DIAGNOSIS — M1990 Unspecified osteoarthritis, unspecified site: Secondary | ICD-10-CM

## 2010-09-24 DIAGNOSIS — K219 Gastro-esophageal reflux disease without esophagitis: Secondary | ICD-10-CM

## 2010-09-24 DIAGNOSIS — F411 Generalized anxiety disorder: Secondary | ICD-10-CM

## 2010-09-24 DIAGNOSIS — M810 Age-related osteoporosis without current pathological fracture: Secondary | ICD-10-CM

## 2010-09-24 DIAGNOSIS — I509 Heart failure, unspecified: Secondary | ICD-10-CM

## 2010-09-24 DIAGNOSIS — K5730 Diverticulosis of large intestine without perforation or abscess without bleeding: Secondary | ICD-10-CM

## 2010-09-24 MED ORDER — IPRATROPIUM BROMIDE 0.06 % NA SOLN: 2.0000 | Freq: Four times a day (QID) | NASAL | Status: DC

## 2010-09-24 NOTE — Progress Notes (Signed)
Subjective:    Patient ID: Andrea Rubio, female    DOB: 17-Oct-1925, 77 y.o.   MRN: [id number]  HPI 77 y/o WF here for a follow up visit... she has multiple medical problems as noted below...   ~  May11:  she saw TP 3/11 w/ skin tear left forearm from near fall- treated in wound care clinic & improved... labs showed Hg=12.5 but Fe=22  therefore started on FeSO4 Bid but she still feels as though her iron is low, she says> discussed checking labs again today (Hg=13, Fe=57, sat=23%)... otherw stable- breathing is OK using nebs, Advair, Pred... BP controlled on meds; no ch in dizziness, denies syncope etc;  severe arthritic changes and able to make it at home w/ help of her son... ~  Sep11:  she has severe DJD/ Gout on Uloric40 w/ Uric=3.2 May11... c/o bilat leg pain & known lumbar disc dis followed by Susann Givens & needs f/u appt & reassessment (prev shots in back neck knees w/o much help)... she doesn't want Codeine/ Vicodin so we will Rx w/ Tramadol/ Tylenol in the interim... otherw stable & managing at home w/ son's help- breathing stable w/o exac;  BP controlled on meds;  OK Flu shot today.  ~  February 19, 2010:  4 mo ROV- c/o incr urination on the Torsemide 20mg /d so she cut it back on her own to 1/2 tab daily but notes incr swelling & BNP= 1445> we discussed need to incr back to 20mg /d... also c/o "hard feeling" in abd- denies n/v, d/c, or blood & we discussed need for further eval w/ XRay Abd & poss CT vs eval by GI if nec (XRay shows stool throughout colon- needs lax Rx first w/ Miralax & Senakot-S daily)... Labs show Hg= 13.0, MCV= 96, but Fe= 13 (TIBC=140, Sat=7%) & we will refer to GI for further eval...  ~  Jun 25, 2010:  737mo ROV & post hosp check> she was [place] x2 in Jan2012 by [facility] w/ Diverticulitis, Dehydration (renal insuffic, incr K+, UTI), severe AS & DiastolicCHF>  They consulted DrKadakia for Cards & she has been to his office "he said everything was fine"...  She went to a NH for rehab x62mo  & has been home x42mo now> ambulating w/ walker & having difficulty w/ legs giving out- getting home therapy thru [place]... [facility] records reviewed in detail; we don't have notes from [place]... She requests a new rolling walker- OK...  CXR today shows cardiomeg, sm right effusion, no edema, bilat shoulder arthroplasties, NAD... Labs look pretty good> see below:  ~  September 24, 2010:  4mo ROV & she is stable, holding her own & doing ok at home w/ her son there to help> in fact her CC today is just some nasal drainage, dripping & we discussed trial Atrovent nasal spray for this... She had a UTI 7/12 & was treated, then saw Urology & they found postmenopausal atrophic vaginitis w/ topical estrogen cream Rx & this has helped she reports...  States breathing is OK, BP well controlled on meds & she still sees [place] for Cards> she reports doing well, seen 37mo ago, no ch in meds...          Problem List:  MACULAR DEGENERATION (ICD-362.50)  SINUSITIS, CHRONIC (ICD-473.9) - she is s/p bliat [facility] surg 1986 by DrKraus... ~  CT Sinus 11/09 showed chr sinusitis changes in the max & sphenoid regions... ~  8/12: she is c/o drippy nose, trial Atrovent nasal spray.Marland KitchenMarland Kitchen  ASTHMATIC BRONCHITIS, ACUTE (ICD-466.0) - on PRED 5mg /d, ADVAIR 250Bid, & ALBUTEROL NEBS Tid... stable- min cough, w/o sputum, no change in DOE, etc... ~  PFT's 3/06 showed FVC= 1.49 (68%), FEV1= 1.12 (75%), %1sec=75, mid-flows= 43%pred... ~  [facility] 8/09 w/ bilat pneumonia... see DC Summary. ~  CXR 10/09 showed stable cardiomeg, tort Ao, clear lungs, bilat shoulder arthroplasties... ~  CT Angio Chest 11/09 showed neg for PE, fluid/ mucus plugs in depend LL bronchi... ~  CXR 10/10 showed stable cardiomeg, bilat humeral head prostheses, NAD.Marland Kitchen. ~  CXR 1/12 showed COPD, chr changes, ?right basilar opacity ==> subsequent films w/ incr edema. ~  CXR 5/12 showed cardiomeg, sm right effusion, no edema, bilat shoulder arthroplasties,  NAD...  HYPERTENSION (ICD-401.9) - on LISINOPRIL 5mg /d, FUROSEMIDE 40mg /d & K20/d...  her BP's have stabilized at home and measures 112/68 here today... she denies CP, palpit, syncope, ch in edema...  AORTIC STENOSIS (ICD-424.1)   << followed by [id number] for Cards >> Hx of CHF (ICD-428.0)  ~  2DEcho 3/06 showed mild AS/AI w/ mod incr AoV thickness & reduced leaflet excursion, mild MR, norm EF=55-65%...  ~  2DEcho 12/08 showed mod inc AoV thickness, mod reduced AV leaflet excursion, c/w mod AS, mild AI & mean grad= ;  norm LVF w/ EF= 65% and no wall motion abn etc... ~  2DEcho 1/12 in [place] showed severeAS & mildAI, mild conc LVH, norm sys func w/ EF=60-65%, HK at apex, grI DD... ~  EKG 1/12 showed NSR, poor R progression, NSSTTWA...  PERIPHERAL VASCULAR DISEASE (ICD-443.9) - on ASA 81mg /d... she can't walk enough to elicit discomfort, no rest pain or lesions... ~  ABI's 12/01 showed .88 on R, and .95 on L... ~  MRI showed small vessel dis & small infarct noted...  VENOUS INSUFFICIENCY (ICD-459.81) - on low sodium diet, Lasix, KCl...  HYPERLIPIDEMIA (ICD-272.4) - now on ZOCOR 40mg  per DrKadakia (prev on Pravachol, but stated intol to other Statins)... takes Fish Oil ~2000mg /d rec by her ophthalmologist... ~  FLP 9/05 ?on Prav40 showed TChol 176, TG 190, HDL 55, LDL 83... ~  FLP 4/10 on diet alone showed TChol 211, TG 153, HDL 44, LDL 128... she refuses med Rx- continue diet efforts. ~  1/12:  Hosp by [facility] & consulted DrKadakia who started her on ZOCOR40mg /d> he is checking her labs now...  HIATAL HERNIA (ICD-553.3) - on PRILOSEC 20mg /d & METACLOPRAMIDE 10mg Qhs... GERD (ICD-530.81) - EGD 3/06 by DrStark showed 3cmHH, mild esophagitis...  DIVERTICULOSIS, COLON (ICD-562.10) - last colonoscopy was 6/06 by [facility] showed divertics and ischemic colitis...  ~  Adm 1/12 by [facility] w/ Diverticulitis, treated & improved, she has not followed up w/ GI; she take 1/2 capful of MIRALAX daily.  STRESS  INCONTINENCE (ICD-788.39) - she has hx UTI's on & off... ~  10/10: urine c/s +EColi & Rx'd w/ Cipro.  DEGENERATIVE JOINT DISEASE (ICD-715.90) - she has severe DJD & Gout w/ deformities- Foot XRay showed Charcot foot deformity, severe degenerative dis, etc...  added TRAMADOL + TYLENOL Prn... GOUT, UNSPECIFIED (ICD-274.9) - labs 4/10 showed Uric Acid level= 7.9.Marland KitchenMarland Kitchen she has hx of gout & hyperuricemia but allergic to Allopurinol w/ rash... therefore on ULORIC 40mg /d... Uric Acid level 5/11 on Uloric40 = 3.2 LOW BACK PAIN (ICD-724.2) NECK PAIN (ICD-723.1) - eval by Susann Givens ==> DrLewitt 2010 w/ MRI & Rx  w/ Lidoderm, Tylenol, Lyrica- now on NEURONTIN... OSTEOPOROSIS (ICD-733.00) - prev on Fosamax (stopped 1/12 in hosp "my kidneys can't handle it"); on Ca++, Vits, &  Vit D... ~  labs 4/10 showed Vit D level = 28... rec> start Vit D OTC 1000 u daily. ~  labs 5/11 showed Vit D level = 32... rec incr Vit D to 2000 u daily.  ANXIETY (ICD-300.00) - off prev Alpraz Rx & takes AMITRIPTYLINE 25mg  Qhs... under stress w/ 33 y/o son had MI & stent in 2010.  Hx of ANEMIA (ICD-285.9) - hx anemia req 2U transfusion 6/06 hosp for ischemic colitis...  ~  Hg= 12 - 13 over 2008-9. ~  labs 8/09 hosp showed Hg= 14 ~  labs 4/10 showed Hg= 12.2 ~  labs 3/11 showed Hg= 12.5, MCV= 92, Fe= 22 (9%sat)... start FeSO4 Bid. ~  labs 5/11 showed = 13.0, MCV= 94, Fe= 57 (23%)... continue Fe supplement.  DERM>  She has skin cancer removed from the right side of her face by DrDanJones [phone])...   Past Surgical History  Procedure Date  . Lumbar laminectomy   . Shoulder surgery     bilateral    Outpatient Encounter Prescriptions as of 09/24/2010  Medication Sig Dispense Refill  . ADVAIR DISKUS 250-50 MCG/DOSE AEPB USE 1 PUFF TWICE DAILY.  60 each  11  . albuterol (PROVENTIL) (2.5 MG/3ML) 0.083% nebulizer solution Take 2.5 mg by nebulization 3 (three) times daily as needed.        Marland Kitchen amitriptyline (ELAVIL) 25 MG tablet TAKE  ONE TABLET AT BEDTIME.  30 tablet  5  . aspirin 81 MG tablet Take 81 mg by mouth daily.        . Calcium Carbonate-Vitamin D (CALCIUM 600+D) 600-400 MG-UNIT per tablet Take 1 tablet by mouth daily.       . Cholecalciferol (VITAMIN D) 1000 UNITS capsule Take 1,000 Units by mouth daily.        . febuxostat (ULORIC) 40 MG tablet Take 40 mg by mouth daily.        . ferrous gluconate (FERGON) 325 MG tablet Take 325 mg by mouth 2 (two) times daily with a meal.        . furosemide (LASIX) 40 MG tablet Take 40 mg by mouth daily.        Marland Kitchen gabapentin (NEURONTIN) 100 MG capsule Take 100 mg by mouth as directed. By Dr Vela Prose       . ipratropium (ATROVENT) 0.06 % nasal spray 2 sprays by Nasal route 3 (three) times daily.        Marland Kitchen K-DUR 20 MEQ tablet TAKE 1 TABLET ONCE DAILY.  30 each  11  . lisinopril (PRINIVIL,ZESTRIL) 5 MG tablet TAKE 1 TABLET ONCE DAILY.  30 tablet  11  . Multiple Vitamin (MULTIVITAMIN) capsule Take 1 capsule by mouth daily.        Marland Kitchen omeprazole (PRILOSEC) 20 MG capsule Take 20 mg by mouth daily.        . polyethylene glycol powder (GLYCOLAX/MIRALAX) powder Take 17 g by mouth daily.        . predniSONE (DELTASONE) 5 MG tablet Take 1 tablet (5 mg total) by mouth daily.  60 tablet  5  . PRILOSEC OTC 20 MG tablet TAKE 1 TABLET ONCE DAILY.  42 each  11  . REGLAN 10 MG tablet TAKE ONE TABLET AT BEDTIME.  30 each  11  . simvastatin (ZOCOR) 40 MG tablet Take 40 mg by mouth at bedtime.        . traMADol (ULTRAM) 50 MG tablet Take 50 mg by mouth every 6 (six) hours as needed. (May take  with Tylenol)         Allergies  Allergen Reactions  . Allopurinol     REACTION: rash  . Codeine   . Hydrocodone     REACTION: hallucinations  . Morphine     REACTION: hallucinations    Review of Systems        See HPI - all other systems neg except as noted... The patient complains of dyspnea on exertion, peripheral edema, abdominal pain, severe indigestion/heartburn, muscle weakness, and difficulty  walking.  The patient denies anorexia, fever, weight loss, weight gain, vision loss, decreased hearing, hoarseness, chest pain, syncope, prolonged cough, headaches, hemoptysis, melena, hematochezia, hematuria, incontinence, suspicious skin lesions, transient blindness, depression, unusual weight change, abnormal bleeding, enlarged lymph nodes, and angioedema.     Objective:   Physical Exam    WD, Petite, sl overweight, 77 y/o WF in NAD... she is <5' tall, weight 140# GENERAL:  Alert & oriented; pleasant & cooperative... HEENT:  [place]/AT, EACs-clear, TMs-wnl, NOSE-clear, THROAT-clear & wnl... known macular degen per ophthal... NECK:  Supple w/ decrROM; no JVD; normal carotid impulses w/o bruits; no thyromegaly or nodules palpated; no lymphadenopathy. CHEST:  Decr BS bilat, Coarse BS, no wheezing or rhonchi... HEART:  Regular Rhythm;  gr 1-2/6 SEM without rubs or gallops detected... ABDOMEN:  Soft & nontender; normal bowel sounds; no organomegaly or masses palpated... EXT: +hand deformities, w/ tophi & mod arthritic changes & contractures... right knee arthritis & severe foot deformities as well. no varicose veins/+venous insuffic/ tr edema. NEURO:  CN's intact x reduced vision... diffusely weak, without focal changes... +gait abn... DERM:  no lesions- ecchymoses, thin skin, etc...   Assessment & Plan:   AB>  Breathing is stable & she has been able to avoid URIs etc; continue Pred, Advair, Nebs.  HBP & Diastolic CHF>  Controlled on meds- Lisinopril & Lasix; continue same.  AS>  Followed by [id number] for her severe AS, not a surg candidate...  HYPERLIPID>  On Simva40 per Cards & they follow her labs...  GI>  Hx HH, GERD, Divertics, Colitis>  On Prilosec, Reglan 10mg  Qhs (she does not want to stop this med), & Miralax as noted...  DJD/ Gout/ Neck&Back Pain>  As noted- on Tramadol, Tylenol, Uloric...  Anxiety>  She is glad to be back home, has [place] home health help.Marland KitchenMarland Kitchen

## 2010-09-24 NOTE — Patient Instructions (Signed)
Today we updated your med list in EPIC...    We wrote a prescription for ATROVENT NASAL SPRAY 0.06%>        spray 2 puffs in each nostril up to 4 times daily as needed...  Call for any questions...  Let's plan a follow up visit in 4 months, sooner if needed for problems.Marland KitchenMarland Kitchen

## 2010-10-04 ENCOUNTER — Encounter: Payer: Self-pay | Admitting: Pulmonary Disease

## 2010-10-08 ENCOUNTER — Other Ambulatory Visit: Payer: Self-pay | Admitting: Pulmonary Disease

## 2010-10-16 ENCOUNTER — Telehealth: Payer: Self-pay | Admitting: Pulmonary Disease

## 2010-10-16 NOTE — Telephone Encounter (Signed)
Called and spoke with pt. Pt is wanting to know what strength and directions are on her Iron.  Per her med list, informed her Iron 325mg  1 po bid with meals.  Pt verbalized understanding.  Nothing further needed.

## 2010-10-28 ENCOUNTER — Other Ambulatory Visit: Payer: Self-pay | Admitting: Dermatology

## 2010-10-31 LAB — GLUCOSE, CAPILLARY
Glucose-Capillary: 122 — ABNORMAL HIGH
Glucose-Capillary: 131 — ABNORMAL HIGH
Glucose-Capillary: 133 — ABNORMAL HIGH
Glucose-Capillary: 166 — ABNORMAL HIGH
Glucose-Capillary: 178 — ABNORMAL HIGH
Glucose-Capillary: 72

## 2010-10-31 LAB — COMPREHENSIVE METABOLIC PANEL
AST: 65 — ABNORMAL HIGH
Albumin: 2.5 — ABNORMAL LOW
Alkaline Phosphatase: 35 — ABNORMAL LOW
Chloride: 112
Creatinine, Ser: 0.97
GFR calc Af Amer: >60
Potassium: 3.9
Total Bilirubin: 0.9

## 2010-10-31 LAB — CBC
HCT: 34.3 — ABNORMAL LOW
Hemoglobin: 11.6 — ABNORMAL LOW
MCHC: 33.2
MCV: 94
MCV: 95.9
Platelets: 133 — ABNORMAL LOW
Platelets: 209
RBC: 3.65 — ABNORMAL LOW
WBC: 5.3
WBC: 5.4
WBC: 6.4

## 2010-10-31 LAB — DIFFERENTIAL
Basophils Absolute: 0
Basophils Relative: 0
Eosinophils Absolute: 0
Eosinophils Absolute: 0.4
Eosinophils Relative: 0
Lymphocytes Relative: 14
Lymphs Abs: 0.3 — ABNORMAL LOW
Lymphs Abs: 0.8
Monocytes Absolute: 0.5
Monocytes Relative: 9
Neutrophils Relative %: 87 — ABNORMAL HIGH

## 2010-10-31 LAB — STREP PNEUMONIAE URINARY ANTIGEN: Strep Pneumo Urinary Antigen: NEGATIVE

## 2010-10-31 LAB — BASIC METABOLIC PANEL
BUN: 25 — ABNORMAL HIGH
BUN: 27 — ABNORMAL HIGH
Chloride: 109
Chloride: 111
Creatinine, Ser: 0.92
GFR calc Af Amer: >60
Potassium: 3.5
Sodium: 142

## 2010-10-31 LAB — URINALYSIS, ROUTINE W REFLEX MICROSCOPIC
Bilirubin Urine: NEGATIVE
Ketones, ur: NEGATIVE
Nitrite: NEGATIVE
Specific Gravity, Urine: 1.012
Urobilinogen, UA: 0.2

## 2010-10-31 LAB — URINE CULTURE

## 2010-10-31 LAB — CARDIAC PANEL(CRET KIN+CKTOT+MB+TROPI)
CK, MB: 2
Total CK: 400 — ABNORMAL HIGH

## 2010-10-31 LAB — CULTURE, BLOOD (ROUTINE X 2)

## 2010-10-31 LAB — H1N1 SCREEN (PCR)

## 2010-11-06 IMAGING — CT CT ANGIO CHEST
1 of 2 series · 18 of 31 positions shown · IV contrast (agent unspecified)
Comparison: 04/06/2004
COMPARISON: None

CLINICAL DATA: Short of breath.  Chronic cough.  Sinus congestion.
Previous sinus surgery.

CT ANGIOGRAPHY CHEST
TECHNIQUE: Multidetector CT imaging of the chest was performed
using the standard protocol during bolus administration of
intravenous contrast. Multiplanar CT image reconstructions
including MIPs were obtained to evaluate the vascular anatomy.
Contrast: 80 ml [id number]
TECHNIQUE: Multidetector CT images of the paranasal sinuses were
obtained in a single plane without intravenous contrast.

[Series 3: ltd sinus 3.0 h30s · axial · 0.29mm/px · z∈[-204,-103]mm · 18 of 27 slices shown]
[im 2/27  lung]
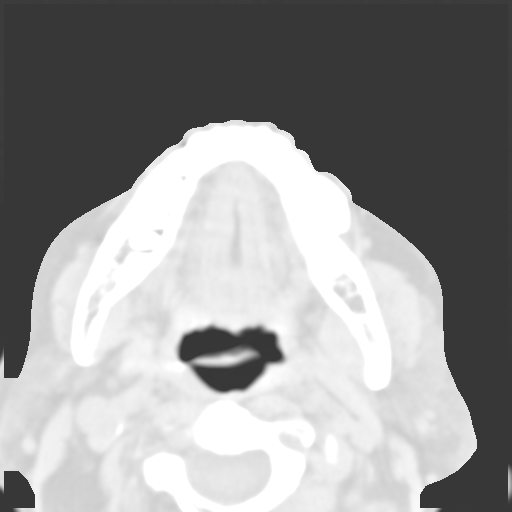
[im 4/27  mediastinal]
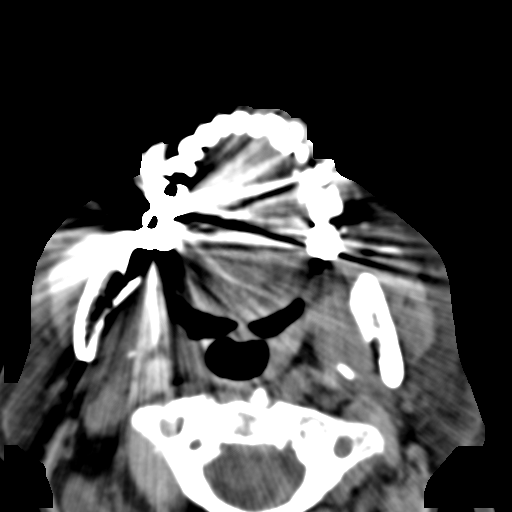
[im 5/27  lung]
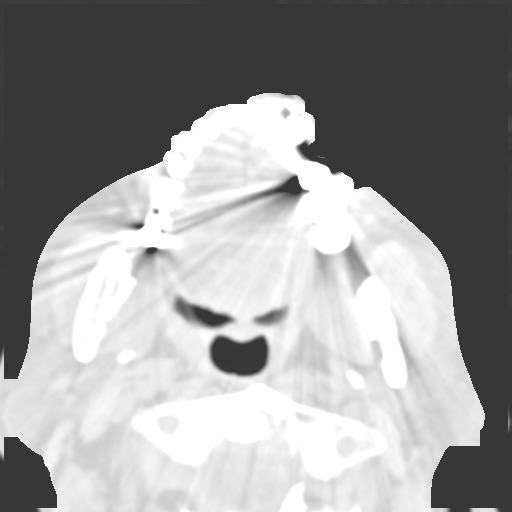
[im 7/27  mediastinal]
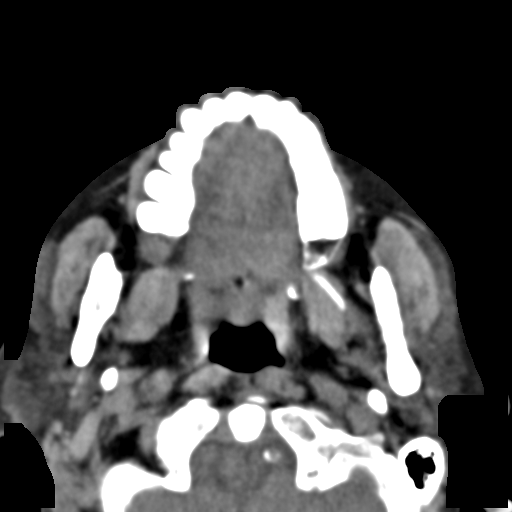
[im 8/27  lung]
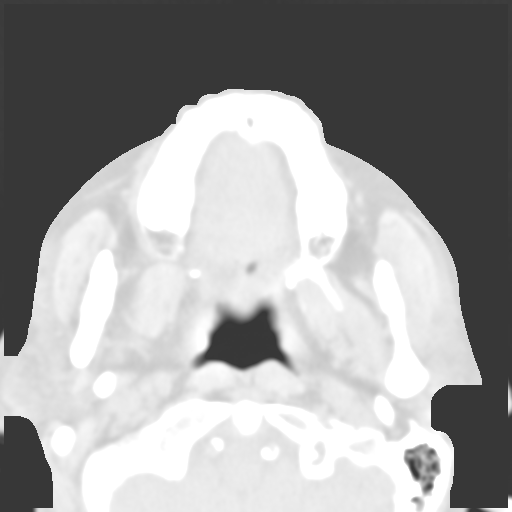
[im 9/27  mediastinal]
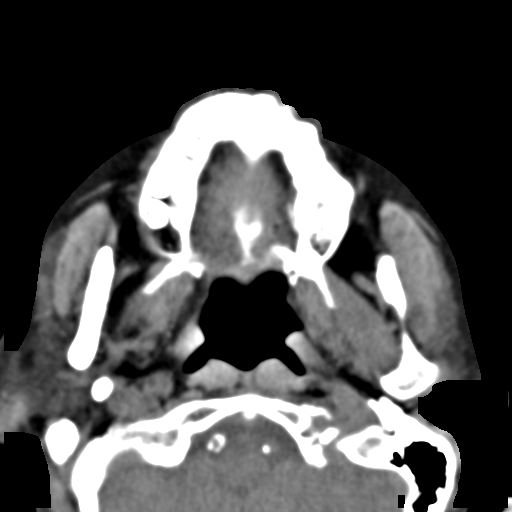
[im 10/27  lung]
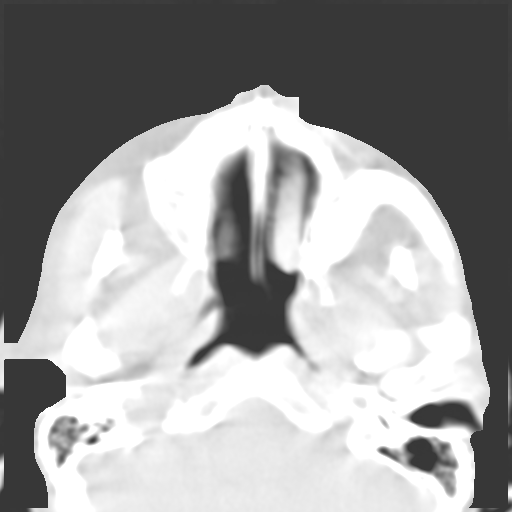
[im 11/27  mediastinal]
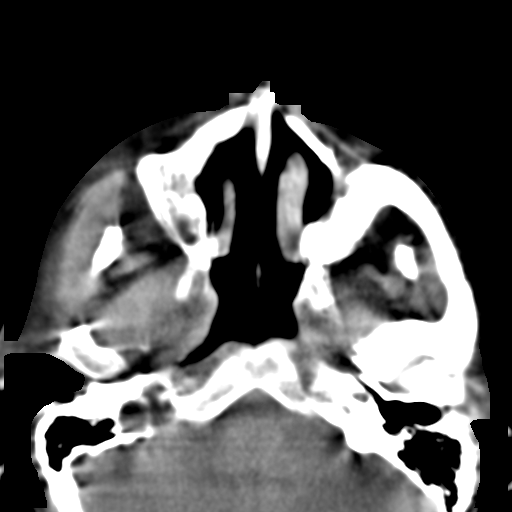
[im 12/27  lung]
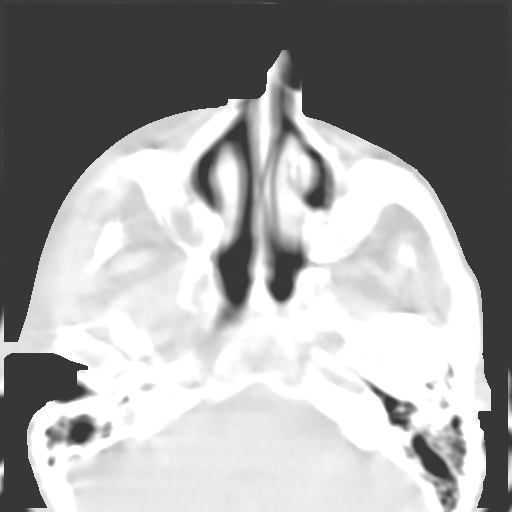
[im 14/27  mediastinal]
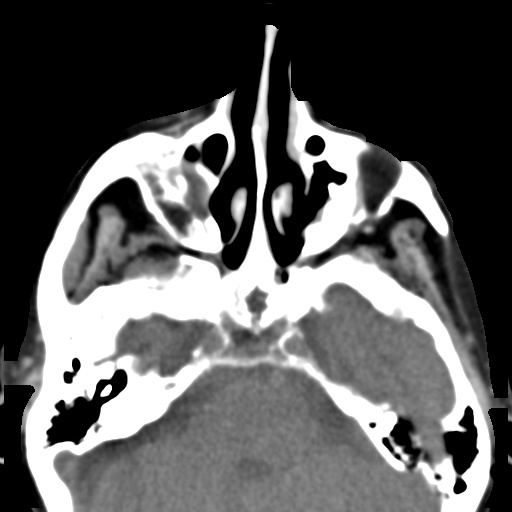
[im 16/27  lung]
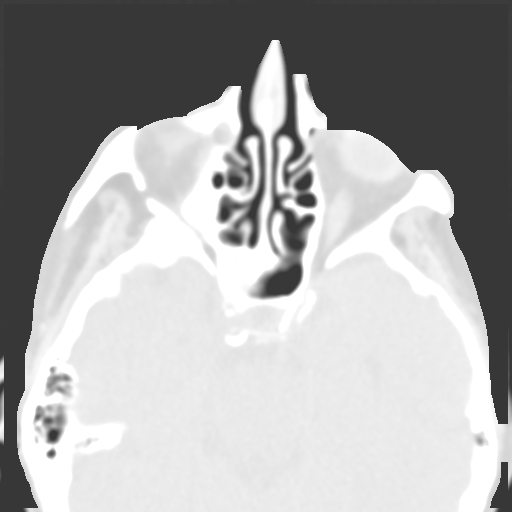
[im 17/27  mediastinal]
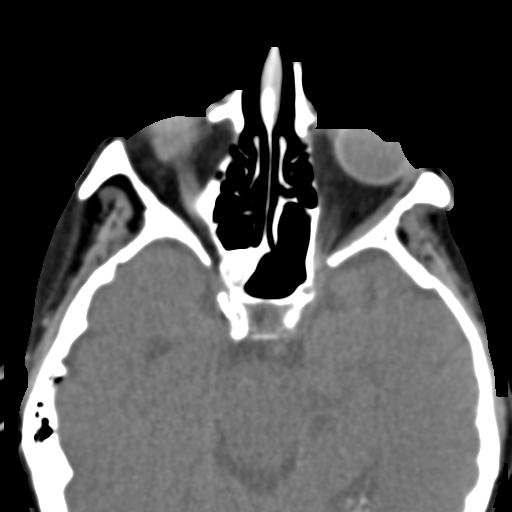
[im 18/27  lung]
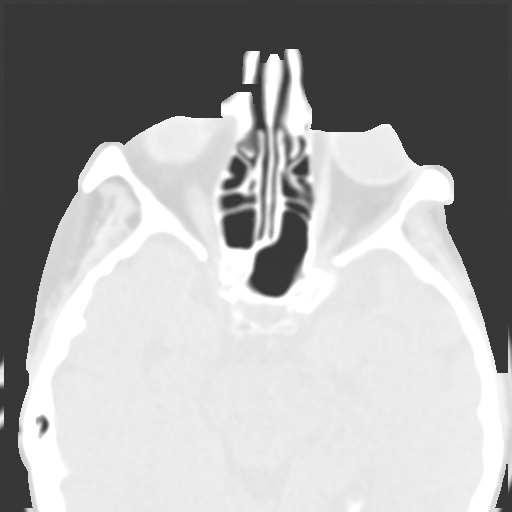
[im 19/27  mediastinal]
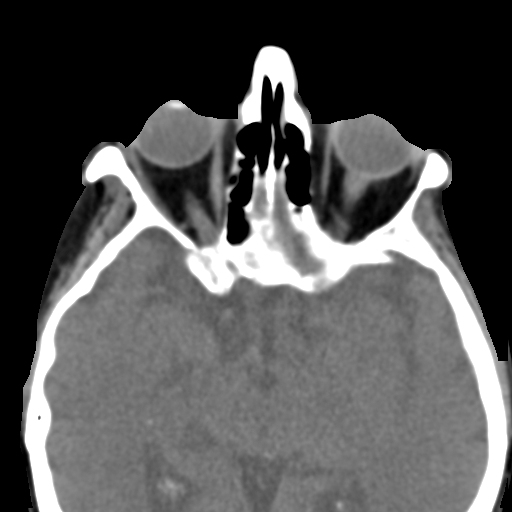
[im 20/27  lung]
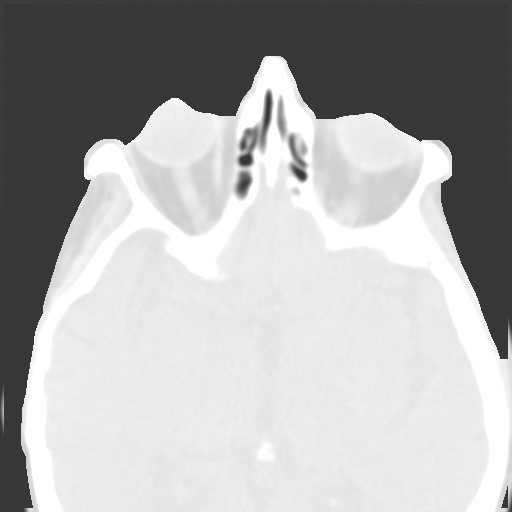
[im 22/27  mediastinal]
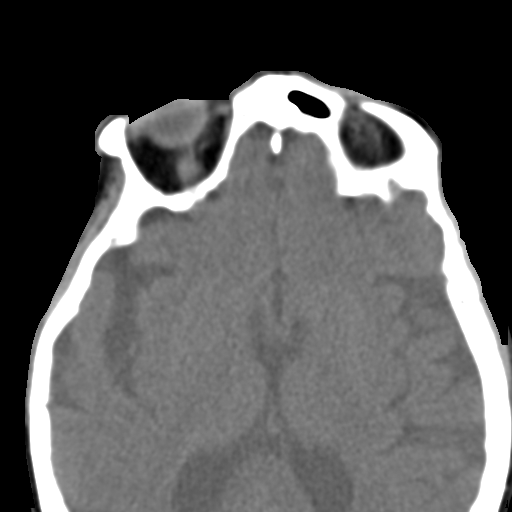
[im 23/27  lung]
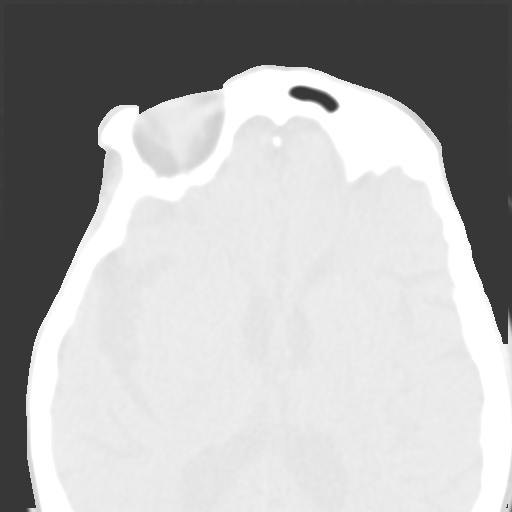
[im 25/27  mediastinal]
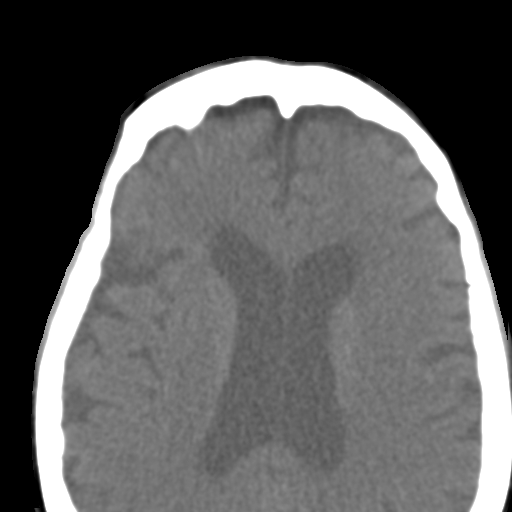

[18 of 31 positions shown; findings below may reference images not displayed]

FINDINGS: Satisfactory opacification of the pulmonary arteries is
seen, and there is no evidence of acute pulmonary embolism.  There
is no evidence of thoracic aortic aneurysm or dissection.
Incidental note is made of an aberrant origin of the right
subclavian artery, which is a normal anatomic variant.  There is no
evidence of hilar or mediastinal soft tissue masses, and no
adenopathy is seen elsewhere within the thorax.

There is no evidence of pleural or pericardial effusion.  Fluid is
noted in the dependent lower lobe segmental and subsegmental
bronchi in both lower lobes, which may represent uncleared
secretions or aspiration.  There is no evidence of pneumonia.  No
suspicious pulmonary nodules or masses are identified.

A small hiatal hernia is seen.  Heart size is at the upper limits
of normal.
IMPRESSION: 1.  No evidence of acute pulmonary embolism.
2.  Fluid noted in the dependent bilateral lower lobe bronchi,
which may be due to uncleared secretions or aspiration; clinical
correlation is recommended.  No evidence of pneumonia.
3.  Small hiatal hernia.

CT PARANASAL SINUS WITHOUT CONTRAST
FINDINGS: Mucosal thickening is seen involving the maxillary
sinuses bilaterally, and both maxillary sinuses are very small in
size with marked chronic bony thickening of the sinus walls.
Additionally, the sphenoid sinus is opacified very small in size
with adjacent chronic thickening of the sinus walls.  These
findings are consistent with chronic sinusitis. Differential
diagnosis includes fibrous dysplasia, however this is considered
unlikely given the bilateral and symmetric bony involvement of the
maxillary sinuses and sphenoid and adjacent skull base.

The ethmoid and frontal sinuses remain clear.  There is no evidence
of sinus air fluid levels.
IMPRESSION: Chronic mucosal thickening and opacification of both maxillary and
sphenoid sinuses.  Marked thickening of the bony sinus walls is
also seen involving both maxillary and sphenoid sinuses.  This is
all likely due to chronic sinusitis, with fibrous dysplasia
considered much less likely given the symmetric and bilateral
pattern of involvement.

## 2010-11-07 LAB — URINE CULTURE
Colony Count: NO GROWTH
Special Requests: NEGATIVE

## 2010-11-07 LAB — CBC
HCT: 23.2 — ABNORMAL LOW
HCT: 24.2 — ABNORMAL LOW
HCT: 24.3 — ABNORMAL LOW
HCT: 24.8 — ABNORMAL LOW
HCT: 25 — ABNORMAL LOW
HCT: 26.8 — ABNORMAL LOW
HCT: 30.3 — ABNORMAL LOW
HCT: 33.8 — ABNORMAL LOW
HCT: 38.2
HCT: 39.4
Hemoglobin: 10.4 — ABNORMAL LOW
Hemoglobin: 12.4
Hemoglobin: 13.4
Hemoglobin: 8 — ABNORMAL LOW
Hemoglobin: 8.4 — ABNORMAL LOW
Hemoglobin: 8.6 — ABNORMAL LOW
MCHC: 33.2
MCHC: 33.7
MCHC: 34
MCHC: 34
MCHC: 34.1
MCHC: 34.2
MCHC: 34.2
MCHC: 34.4
MCHC: 34.4
MCHC: 34.4
MCV: 94.1
MCV: 94.1
MCV: 94.5
MCV: 95.5
MCV: 95.5
MCV: 96
MCV: 96.2
MCV: 96.3
MCV: 96.4
MCV: 96.5
MCV: 96.7
MCV: 96.7
Platelets: 166
Platelets: 181
Platelets: 199
Platelets: 210
Platelets: 213
Platelets: 255
Platelets: 274
RBC: 2.36 — ABNORMAL LOW
RBC: 2.4 — ABNORMAL LOW
RBC: 2.58 — ABNORMAL LOW
RBC: 2.6 — ABNORMAL LOW
RBC: 2.77 — ABNORMAL LOW
RBC: 3.23 — ABNORMAL LOW
RBC: 3.54 — ABNORMAL LOW
RBC: 4.19
RBC: 4.23
RDW: 14.9
RDW: 15.3
RDW: 16 — ABNORMAL HIGH
RDW: 16.7 — ABNORMAL HIGH
RDW: 17 — ABNORMAL HIGH
RDW: 17.1 — ABNORMAL HIGH
RDW: 17.3 — ABNORMAL HIGH
WBC: 10.2
WBC: 10.9 — ABNORMAL HIGH
WBC: 11.8 — ABNORMAL HIGH
WBC: 12.6 — ABNORMAL HIGH
WBC: 13.1 — ABNORMAL HIGH
WBC: 13.5 — ABNORMAL HIGH
WBC: 13.7 — ABNORMAL HIGH
WBC: 14.3 — ABNORMAL HIGH
WBC: 15.1 — ABNORMAL HIGH
WBC: 9.7

## 2010-11-07 LAB — BASIC METABOLIC PANEL
BUN: 12
BUN: 13
BUN: 23
BUN: 24 — ABNORMAL HIGH
BUN: 33 — ABNORMAL HIGH
BUN: 36 — ABNORMAL HIGH
CO2: 20
CO2: 23
CO2: 24
CO2: 28
CO2: 31
Calcium: 7.4 — ABNORMAL LOW
Calcium: 8.4
Chloride: 103
Chloride: 114 — ABNORMAL HIGH
Chloride: 120 — ABNORMAL HIGH
Chloride: 98
Creatinine, Ser: 0.64
Creatinine, Ser: 0.76
Creatinine, Ser: 0.79
Creatinine, Ser: 0.87
Creatinine, Ser: 0.94
Creatinine, Ser: 1.65 — ABNORMAL HIGH
GFR calc Af Amer: 36 — ABNORMAL LOW
GFR calc Af Amer: >60
GFR calc Af Amer: >60
GFR calc non Af Amer: 30 — ABNORMAL LOW
GFR calc non Af Amer: >60
GFR calc non Af Amer: >60
Glucose, Bld: 113 — ABNORMAL HIGH
Glucose, Bld: 125 — ABNORMAL HIGH
Glucose, Bld: 86
Glucose, Bld: 89
Glucose, Bld: 98
Potassium: 2.5 — CL
Potassium: 2.6 — CL
Potassium: 3.5
Potassium: 3.9
Potassium: 4
Potassium: 5.1
Sodium: 141
Sodium: 146 — ABNORMAL HIGH
Sodium: 147 — ABNORMAL HIGH

## 2010-11-07 LAB — COMPREHENSIVE METABOLIC PANEL
ALT: 27
ALT: 76 — ABNORMAL HIGH
AST: 22
AST: 54 — ABNORMAL HIGH
Albumin: 2.2 — ABNORMAL LOW
Albumin: 2.4 — ABNORMAL LOW
Albumin: 2.7 — ABNORMAL LOW
BUN: 35 — ABNORMAL HIGH
CO2: 24
Calcium: 7.5 — ABNORMAL LOW
Calcium: 8.9
Chloride: 101
Chloride: 111
Creatinine, Ser: 0.75
Creatinine, Ser: 1.54 — ABNORMAL HIGH
Creatinine, Ser: 1.64 — ABNORMAL HIGH
GFR calc Af Amer: 37 — ABNORMAL LOW
GFR calc Af Amer: >60
GFR calc Af Amer: >60
GFR calc non Af Amer: 30 — ABNORMAL LOW
Glucose, Bld: 91
Potassium: 3.6
Sodium: 140
Sodium: 141
Total Bilirubin: 0.8
Total Bilirubin: 0.8
Total Protein: 4.8 — ABNORMAL LOW
Total Protein: 5.4 — ABNORMAL LOW
Total Protein: 5.9 — ABNORMAL LOW

## 2010-11-07 LAB — PHOSPHORUS
Phosphorus: 4.1
Phosphorus: 5.1 — ABNORMAL HIGH

## 2010-11-07 LAB — BLOOD GAS, ARTERIAL
Acid-base deficit: 2.5 — ABNORMAL HIGH
Acid-base deficit: 3.6 — ABNORMAL HIGH
Bicarbonate: 19.1 — ABNORMAL LOW
Drawn by: 275301
Drawn by: 295031
FIO2: 0.35
O2 Content: 2
O2 Content: 3
O2 Saturation: 98.1
PEEP: 5
TCO2: 17.7
TCO2: 17.7
pCO2 arterial: 24.4 — ABNORMAL LOW
pCO2 arterial: 27.2 — ABNORMAL LOW
pCO2 arterial: 31.1 — ABNORMAL LOW
pH, Arterial: 7.399
pO2, Arterial: 111 — ABNORMAL HIGH
pO2, Arterial: 113 — ABNORMAL HIGH

## 2010-11-07 LAB — CARDIAC PANEL(CRET KIN+CKTOT+MB+TROPI)
CK, MB: 10.2 — ABNORMAL HIGH
CK, MB: 8.6 — ABNORMAL HIGH
Relative Index: 0.5
Relative Index: 0.8
Total CK: 1119 — ABNORMAL HIGH
Total CK: 1595 — ABNORMAL HIGH
Total CK: 2059 — ABNORMAL HIGH
Troponin I: 0.14 — ABNORMAL HIGH
Troponin I: 0.19 — ABNORMAL HIGH

## 2010-11-07 LAB — AMYLASE: Amylase: 433 — ABNORMAL HIGH

## 2010-11-07 LAB — DIFFERENTIAL
Basophils Absolute: 0
Basophils Relative: 0
Eosinophils Absolute: 0 — ABNORMAL LOW
Eosinophils Absolute: 0.2
Eosinophils Relative: 0
Lymphocytes Relative: 10 — ABNORMAL LOW
Lymphocytes Relative: 4 — ABNORMAL LOW
Lymphs Abs: 1.4
Monocytes Absolute: 0.9
Monocytes Relative: 2 — ABNORMAL LOW
Monocytes Relative: 7
Neutro Abs: 11.5 — ABNORMAL HIGH
Neutrophils Relative %: 82 — ABNORMAL HIGH
Neutrophils Relative %: 94 — ABNORMAL HIGH

## 2010-11-07 LAB — CULTURE, BLOOD (ROUTINE X 2): Culture: NO GROWTH

## 2010-11-07 LAB — APTT
aPTT: 40 — ABNORMAL HIGH
aPTT: 46 — ABNORMAL HIGH

## 2010-11-07 LAB — CROSSMATCH
ABO/RH(D): A POS
Antibody Screen: NEGATIVE
Antibody Screen: NEGATIVE

## 2010-11-07 LAB — URINALYSIS, ROUTINE W REFLEX MICROSCOPIC
Glucose, UA: NEGATIVE
Leukocytes, UA: NEGATIVE
Specific Gravity, Urine: 1.014
Urobilinogen, UA: 0.2

## 2010-11-07 LAB — TSH: TSH: 1.017

## 2010-11-07 LAB — HEPARIN LEVEL (UNFRACTIONATED)
Heparin Unfractionated: 0.13 — ABNORMAL LOW
Heparin Unfractionated: 0.51

## 2010-11-07 LAB — MAGNESIUM
Magnesium: 2.2
Magnesium: 2.3
Magnesium: 2.4

## 2010-11-07 LAB — CARBOXYHEMOGLOBIN
Methemoglobin: 1.7 — ABNORMAL HIGH
Methemoglobin: 2.2 — ABNORMAL HIGH
O2 Saturation: 71.7
O2 Saturation: 72.7
Total hemoglobin: 11.5 — ABNORMAL LOW
Total hemoglobin: 8.7 — ABNORMAL LOW
Total hemoglobin: 9.4 — ABNORMAL LOW

## 2010-11-07 LAB — LEGIONELLA ANTIGEN, URINE: Legionella Antigen, Urine: NEGATIVE

## 2010-11-07 LAB — IRON AND TIBC
Iron: 30 — ABNORMAL LOW
TIBC: 150 — ABNORMAL LOW
UIBC: 120

## 2010-11-07 LAB — URINE MICROSCOPIC-ADD ON: Urine-Other: NONE SEEN

## 2010-11-07 LAB — ABO/RH: ABO/RH(D): A POS

## 2010-11-07 LAB — STREP PNEUMONIAE URINARY ANTIGEN: Strep Pneumo Urinary Antigen: NEGATIVE

## 2010-11-07 LAB — B-NATRIURETIC PEPTIDE (CONVERTED LAB): Pro B Natriuretic peptide (BNP): 127 — ABNORMAL HIGH

## 2010-11-07 LAB — RETICULOCYTES: RBC.: 2.73 — ABNORMAL LOW

## 2010-11-07 LAB — OCCULT BLOOD X 1 CARD TO LAB, STOOL: Fecal Occult Bld: NEGATIVE

## 2010-11-07 LAB — VITAMIN B12: Vitamin B-12: 1012 — ABNORMAL HIGH (ref 211–911)

## 2010-11-07 LAB — D-DIMER, QUANTITATIVE: D-Dimer, Quant: 1.44 — ABNORMAL HIGH

## 2010-11-24 ENCOUNTER — Other Ambulatory Visit: Payer: Self-pay | Admitting: Pulmonary Disease

## 2010-12-01 ENCOUNTER — Telehealth: Payer: Self-pay | Admitting: Pulmonary Disease

## 2010-12-01 ENCOUNTER — Encounter: Payer: Self-pay | Admitting: Adult Health

## 2010-12-01 ENCOUNTER — Ambulatory Visit (INDEPENDENT_AMBULATORY_CARE_PROVIDER_SITE_OTHER): Payer: Medicare Other | Admitting: Adult Health

## 2010-12-01 VITALS — BP 110/70 | HR 89 | Temp 98.3°F | Ht 59.0 in | Wt 132.8 lb

## 2010-12-01 DIAGNOSIS — J209 Acute bronchitis, unspecified: Secondary | ICD-10-CM

## 2010-12-01 MED ORDER — AMOXICILLIN-POT CLAVULANATE 875-125 MG PO TABS: 1.0000 | ORAL_TABLET | Freq: Two times a day (BID) | ORAL | Status: AC

## 2010-12-01 NOTE — Assessment & Plan Note (Signed)
Augmentin 875mg Twice daily  For 7 days -take with food  Increase Prednisone 10mg daily x 1 week then back 5mg daily  Mucinex DM.bid As needed  Cough/congestion Fluids and rest  Please contact office for sooner follow up if symptoms do not improve or worsen or seek emergency care   

## 2010-12-01 NOTE — Patient Instructions (Signed)
Augmentin 875mg  Twice daily  For 7 days -take with food  Increase Prednisone 10mg  daily x 1 week then back 5mg  daily  Mucinex DM.bid As needed  Cough/congestion Fluids and rest  Please contact office for sooner follow up if symptoms do not improve or worsen or seek emergency care

## 2010-12-01 NOTE — Telephone Encounter (Signed)
Pt has an appt today at 2:15 with TP. Carron Curie, CMA

## 2010-12-01 NOTE — Progress Notes (Signed)
Subjective:    Patient ID: Andrea Rubio, female    DOB: 1925/06/10, 77 y.o.   MRN: [id number]  HPI 77 y/o WF    ~  May11:  she saw TP 3/11 w/ skin tear left forearm from near fall- treated in wound care clinic & improved... labs showed Hg=12.5 but Fe=22  therefore started on FeSO4 Bid but she still feels as though her iron is low, she says> discussed checking labs again today (Hg=13, Fe=57, sat=23%)... otherw stable- breathing is OK using nebs, Advair, Pred... BP controlled on meds; no ch in dizziness, denies syncope etc;  severe arthritic changes and able to make it at home w/ help of her son... ~  Sep11:  she has severe DJD/ Gout on Uloric40 w/ Uric=3.2 May11... c/o bilat leg pain & known lumbar disc dis followed by Susann Givens & needs f/u appt & reassessment (prev shots in back neck knees w/o much help)... she doesn't want Codeine/ Vicodin so we will Rx w/ Tramadol/ Tylenol in the interim... otherw stable & managing at home w/ son's help- breathing stable w/o exac;  BP controlled on meds;  OK Flu shot today.  ~  February 19, 2010:  4 mo ROV- c/o incr urination on the Torsemide 20mg /d so she cut it back on her own to 1/2 tab daily but notes incr swelling & BNP= 1445> we discussed need to incr back to 20mg /d... also c/o "hard feeling" in abd- denies n/v, d/c, or blood & we discussed need for further eval w/ XRay Abd & poss CT vs eval by GI if nec (XRay shows stool throughout colon- needs lax Rx first w/ Miralax & Senakot-S daily)... Labs show Hg= 13.0, MCV= 96, but Fe= 13 (TIBC=140, Sat=7%) & we will refer to GI for further eval...  ~  Jun 25, 2010:  67mo ROV & post hosp check> she was [facility] x2 in Jan2012 by [facility] w/ Diverticulitis, Dehydration (renal insuffic, incr K+, UTI), severe AS & DiastolicCHF>  They consulted DrKadakia for Cards & she has been to his office "he said everything was fine"...  She went to a NH for rehab x99mo & has been home x30mo now> ambulating w/ walker & having difficulty w/ legs  giving out- getting home therapy thru [place]... [facility] records reviewed in detail; we don't have notes from [place]... She requests a new rolling walker- OK...  CXR today shows cardiomeg, sm right effusion, no edema, bilat shoulder arthroplasties, NAD... Labs look pretty good> see below:  ~  September 24, 2010:  55mo ROV & she is stable, holding her own & doing ok at home w/ her son there to help> in fact her CC today is just some nasal drainage, dripping & we discussed trial Atrovent nasal spray for this... She had a UTI 7/12 & was treated, then saw Urology & they found postmenopausal atrophic vaginitis w/ topical estrogen cream Rx & this has helped she reports...  States breathing is OK, BP well controlled on meds & she still sees [place] for Cards> she reports doing well, seen 59mo ago, no ch in meds...  ~12/01/2010 Acute OV  Complains of  possible bronchitis. Complains of  light brown mucus and chest tightness when she tries to cough up mucus. Also, sob and wheezing for 1 week. Over-the-counter medications are not working for symptoms. Cough seems to be getting worse for the last 2 days. Because is getting thick especially in the afternoon hours. No hemoptysis, chest pain,  abdominal pain or edema.  Problem List:  MACULAR DEGENERATION (ICD-362.50)  SINUSITIS, CHRONIC (ICD-473.9) - she is s/p bliat [facility] 1986 by DrKraus... ~  CT Sinus 11/09 showed chr sinusitis changes in the max & sphenoid regions... ~  8/12: she is c/o drippy nose, trial Atrovent nasal spray...  ASTHMATIC BRONCHITIS, ACUTE (ICD-466.0) - on PRED 5mg /d, ADVAIR 250Bid, & ALBUTEROL NEBS Tid... stable- min cough, w/o sputum, no change in DOE, etc... ~  PFT's 3/06 showed FVC= 1.49 (68%), FEV1= 1.12 (75%), %1sec=75, mid-flows= 43%pred... ~  [facility] 8/09 w/ bilat pneumonia... see DC Summary. ~  CXR 10/09 showed stable cardiomeg, tort Ao, clear lungs, bilat shoulder arthroplasties... ~  CT Angio Chest 11/09 showed  neg for PE, fluid/ mucus plugs in depend LL bronchi... ~  CXR 10/10 showed stable cardiomeg, bilat humeral head prostheses, NAD.Marland Kitchen. ~  CXR 1/12 showed COPD, chr changes, ?right basilar opacity ==> subsequent films w/ incr edema. ~  CXR 5/12 showed cardiomeg, sm right effusion, no edema, bilat shoulder arthroplasties, NAD...  HYPERTENSION (ICD-401.9) - on LISINOPRIL 5mg /d, FUROSEMIDE 40mg /d & K20/d...  her BP's have stabilized at home and measures 112/68 here today... she denies CP, palpit, syncope, ch in edema...  AORTIC STENOSIS (ICD-424.1)   << followed by [id number] for Cards >> Hx of CHF (ICD-428.0)  ~  2DEcho 3/06 showed mild AS/AI w/ mod incr AoV thickness & reduced leaflet excursion, mild MR, norm EF=55-65%...  ~  2DEcho 12/08 showed mod inc AoV thickness, mod reduced AV leaflet excursion, c/w mod AS, mild AI & mean grad= ;  norm LVF w/ EF= 65% and no wall motion abn etc... ~  2DEcho 1/12 in [place] showed severeAS & mildAI, mild conc LVH, norm sys func w/ EF=60-65%, HK at apex, grI DD... ~  EKG 1/12 showed NSR, poor R progression, NSSTTWA...  PERIPHERAL VASCULAR DISEASE (ICD-443.9) - on ASA 81mg /d... she can't walk enough to elicit discomfort, no rest pain or lesions... ~  ABI's 12/01 showed .88 on R, and .95 on L... ~  MRI showed small vessel dis & small infarct noted...  VENOUS INSUFFICIENCY (ICD-459.81) - on low sodium diet, Lasix, KCl...  HYPERLIPIDEMIA (ICD-272.4) - now on ZOCOR 40mg  per DrKadakia (prev on Pravachol, but stated intol to other Statins)... takes Fish Oil ~2000mg /d rec by her ophthalmologist... ~  FLP 9/05 ?on Prav40 showed TChol 176, TG 190, HDL 55, LDL 83... ~  FLP 4/10 on diet alone showed TChol 211, TG 153, HDL 44, LDL 128... she refuses med Rx- continue diet efforts. ~  1/12:  Hosp by [facility] & consulted DrKadakia who started her on ZOCOR40mg /d> he is checking her labs now...  HIATAL HERNIA (ICD-553.3) - on PRILOSEC 20mg /d & METACLOPRAMIDE 10mg Qhs... GERD  (ICD-530.81) - EGD 3/06 by DrStark showed 3cmHH, mild esophagitis...  DIVERTICULOSIS, COLON (ICD-562.10) - last colonoscopy was 6/06 by [facility] showed divertics and ischemic colitis...  ~  Adm 1/12 by [facility] w/ Diverticulitis, treated & improved, she has not followed up w/ GI; she take 1/2 capful of MIRALAX daily.  STRESS INCONTINENCE (ICD-788.39) - she has hx UTI's on & off... ~  10/10: urine c/s +EColi & Rx'd w/ Cipro.  DEGENERATIVE JOINT DISEASE (ICD-715.90) - she has severe DJD & Gout w/ deformities- Foot XRay showed Charcot foot deformity, severe degenerative dis, etc...  added TRAMADOL + TYLENOL Prn... GOUT, UNSPECIFIED (ICD-274.9) - labs 4/10 showed Uric Acid level= 7.9.Marland KitchenMarland Kitchen she has hx of gout & hyperuricemia but allergic to Allopurinol w/ rash... therefore on ULORIC 40mg /d... Uric Acid level 5/11  on Uloric40 = 3.2 LOW BACK PAIN (ICD-724.2) NECK PAIN (ICD-723.1) - eval by Susann Givens ==> DrLewitt 2010 w/ MRI & Rx  w/ Lidoderm, Tylenol, Lyrica- now on NEURONTIN... OSTEOPOROSIS (ICD-733.00) - prev on Fosamax (stopped 1/12 in hosp "my kidneys can't handle it"); on Ca++, Vits, & Vit D... ~  labs 4/10 showed Vit D level = 28... rec> start Vit D OTC 1000 u daily. ~  labs 5/11 showed Vit D level = 32... rec incr Vit D to 2000 u daily.  ANXIETY (ICD-300.00) - off prev Alpraz Rx & takes AMITRIPTYLINE 25mg  Q[facility]... under stress w/ 62 y/o son had MI & stent in 2010.  Hx of ANEMIA (ICD-285.9) - hx anemia req 2U transfusion 6/06 hosp for ischemic colitis...  ~  Hg= 12 - 13 over 2008-9. ~  labs 8/09 hosp showed Hg= 14 ~  labs 4/10 showed Hg= 12.2 ~  labs 3/11 showed Hg= 12.5, MCV= 92, Fe= 22 (9%sat)... start FeSO4 Bid. ~  labs 5/11 showed = 13.0, MCV= 94, Fe= 57 (23%)... continue Fe supplement.  DERM>  She has skin cancer removed from the right side of her face by DrDanJones [phone])...   Past Surgical History  Procedure Date  . Lumbar laminectomy   . Shoulder surgery     bilateral    Outpatient  Encounter Prescriptions as of 12/01/2010  Medication Sig Dispense Refill  . ADVAIR DISKUS 250-50 MCG/DOSE AEPB USE 1 PUFF TWICE DAILY.  60 each  11  . albuterol (PROVENTIL) (2.5 MG/3ML) 0.083% nebulizer solution Take 2.5 mg by nebulization 3 (three) times daily as needed.        Marland Kitchen amitriptyline (ELAVIL) 25 MG tablet TAKE ONE TABLET AT BEDTIME.  30 tablet  5  . aspirin 81 MG tablet Take 81 mg by mouth daily.        . Calcium Carbonate-Vitamin D (CALCIUM 600+D) 600-400 MG-UNIT per tablet Take 1 tablet by mouth daily.       . Cholecalciferol (VITAMIN D) 1000 UNITS capsule Take 1,000 Units by mouth daily.        . ferrous gluconate (FERGON) 325 MG tablet Take 325 mg by mouth 2 (two) times daily with a meal.        . furosemide (LASIX) 40 MG tablet Take 40 mg by mouth daily.        Marland Kitchen ipratropium (ATROVENT) 0.06 % nasal spray Place 2 sprays into the nose 4 (four) times daily.  15 mL  11  . IRON PO Take by mouth. 2 tabs in the morning and 2 tabs at bedtime; mg unknown       . K-DUR 20 MEQ tablet TAKE 1 TABLET ONCE DAILY.  30 each  11  . lisinopril (PRINIVIL,ZESTRIL) 5 MG tablet TAKE 1 TABLET ONCE DAILY.  30 tablet  11  . Multiple Vitamin (MULTIVITAMIN) capsule Take 1 capsule by mouth daily.        Marland Kitchen omeprazole (PRILOSEC) 20 MG capsule Take 20 mg by mouth daily.        . polyethylene glycol powder (GLYCOLAX/MIRALAX) powder Take 17 g by mouth daily.        . predniSONE (DELTASONE) 5 MG tablet Take 1 tablet (5 mg total) by mouth daily.  60 tablet  5  . PRILOSEC OTC 20 MG tablet Daily.      . REGLAN 10 MG tablet TAKE ONE TABLET AT BEDTIME.  30 each  11  . simvastatin (ZOCOR) 40 MG tablet Take 40 mg by mouth at  bedtime.        Marland Kitchen ULORIC 40 MG tablet TAKE 1 TABLET EACH DAY.  30 each  6  . ULTRAM 50 MG tablet TAKE ONE TABLET EVERY 6 HOURS AS NEEDED FOR PAIN (MAY TAKE WITH TYLENOL).  50 each  5  . clotrimazole-betamethasone (LOTRISONE) cream as needed.      . conjugated estrogens (PREMARIN) vaginal cream  Every other day.        Allergies  Allergen Reactions  . Allopurinol     REACTION: rash  . Codeine   . Hydrocodone     REACTION: hallucinations  . Morphine     REACTION: hallucinations    Review of Systems       Constitutional:   No  weight loss, night sweats,  Fevers, chills, fatigue, or  lassitude.  HEENT:   No headaches,  Difficulty swallowing,  Tooth/dental problems, or  Sore throat,                No sneezing, itching, ear ache, + nasal congestion, post nasal drip,   CV:  No chest pain,  Orthopnea, PND, swelling in lower extremities, anasarca, dizziness, palpitations, syncope.   GI  No heartburn, indigestion, abdominal pain, nausea, vomiting, diarrhea, change in bowel habits, loss of appetite, bloody stools.   Resp:   No coughing up of blood.    No chest wall deformity  Skin: no rash or lesions.  GU: no dysuria, change in color of urine, no urgency or frequency.  No flank pain, no hematuria   MS:  No joint pain or swelling.  No decreased range of motion.  No back pain.  Psych:  No change in mood or affect. No depression or anxiety.  No memory loss.        Objective:   Physical Exam    WD, Petite, sl overweight, 77 y/o WF in NAD.  GENERAL:  Alert & oriented; pleasant & cooperative... HEENT:  [place]/AT, EACs-clear, TMs-wnl, NOSE-clear, THROAT-clear & wnl NECK:  Supple w/ decrROM; no JVD; normal carotid impulses w/o bruits; no thyromegaly or nodules palpated; no lymphadenopathy. CHEST:  Coarse BS with few rhonchi  HEART:  Regular Rhythm;  gr 1-2/6 SEM without rubs or gallops detected... ABDOMEN:  Soft & nontender; normal bowel sounds; no organomegaly or masses palpated... EXT: +hand deformities, w/ tophi & mod arthritic changes & contractures... right knee arthritis & severe foot deformities as well. no varicose veins/+venous insuffic/ tr edema. DERM:  no lesions- ecchymoses, thin skin, etc...   Assessment & Plan:

## 2010-12-05 ENCOUNTER — Telehealth: Payer: Self-pay | Admitting: Adult Health

## 2010-12-05 NOTE — Telephone Encounter (Signed)
Called and spoke with pt and she stated that she was given augmentin on Monday---pt is now c/o of burning with urination and this started yesterday after starting on the augmentin and pt stated that she is having to go to the bathroom more than usual. Pt is requesting recs on this please. Thanks   Allergies  Allergen Reactions  . Allopurinol     REACTION: rash  . Codeine   . Hydrocodone     REACTION: hallucinations  . Morphine     REACTION: hallucinations

## 2010-12-05 NOTE — Telephone Encounter (Signed)
Called, spoke with pt.  I informed her TP recs she increase fluid intake and if not improving will need to check for UTI.  Pt states she will try to increase her fluids and if this does not help states she will call back on Monday.  I did advised pt if her symptoms worsen or do not improve to call back sooner or seek emergency care.  She verbalized understanding of these instructions.

## 2010-12-05 NOTE — Telephone Encounter (Signed)
Increase fluid intake , if not improving will need to check for UTI -can drop of sample in sterile cup Please contact office for sooner follow up if symptoms do not improve or worsen or seek emergency care

## 2010-12-08 ENCOUNTER — Other Ambulatory Visit: Payer: Medicare Other

## 2010-12-08 ENCOUNTER — Telehealth: Payer: Self-pay | Admitting: Pulmonary Disease

## 2010-12-08 ENCOUNTER — Other Ambulatory Visit: Payer: Self-pay | Admitting: Pulmonary Disease

## 2010-12-08 DIAGNOSIS — R300 Dysuria: Secondary | ICD-10-CM

## 2010-12-08 LAB — URINALYSIS, ROUTINE W REFLEX MICROSCOPIC
Nitrite: NEGATIVE
Specific Gravity, Urine: 1.01 (ref 1.000–1.030)
Total Protein, Urine: NEGATIVE
Urine Glucose: NEGATIVE
pH: 6.5 (ref 5.0–8.0)

## 2010-12-08 NOTE — Telephone Encounter (Signed)
Spoke with pt. She is c/o continuing to have painful urination and wants to come in and leave urine sample. She states that she was not able to do this last week when we wanted her to but can come in today. I have placed order for this and discussed with Leigh. WIll forward msg to her so can keep an eye out for results. Thanks Leigh.

## 2010-12-08 NOTE — Telephone Encounter (Signed)
Per SN---on infection seen in the ua but we will wait for the culture to come back.  Pt is advised to increase fluids and cranberry juice.   pts son is aware of results and will let pt know.

## 2010-12-10 LAB — URINE CULTURE

## 2011-01-12 ENCOUNTER — Other Ambulatory Visit: Payer: Self-pay | Admitting: Pulmonary Disease

## 2011-01-21 ENCOUNTER — Ambulatory Visit (INDEPENDENT_AMBULATORY_CARE_PROVIDER_SITE_OTHER): Payer: Medicare Other | Admitting: Pulmonary Disease

## 2011-01-21 ENCOUNTER — Encounter: Payer: Self-pay | Admitting: Pulmonary Disease

## 2011-01-21 ENCOUNTER — Other Ambulatory Visit (INDEPENDENT_AMBULATORY_CARE_PROVIDER_SITE_OTHER): Payer: Medicare Other

## 2011-01-21 DIAGNOSIS — M1990 Unspecified osteoarthritis, unspecified site: Secondary | ICD-10-CM

## 2011-01-21 DIAGNOSIS — I10 Essential (primary) hypertension: Secondary | ICD-10-CM

## 2011-01-21 DIAGNOSIS — J209 Acute bronchitis, unspecified: Secondary | ICD-10-CM

## 2011-01-21 DIAGNOSIS — M810 Age-related osteoporosis without current pathological fracture: Secondary | ICD-10-CM

## 2011-01-21 DIAGNOSIS — M545 Low back pain, unspecified: Secondary | ICD-10-CM

## 2011-01-21 DIAGNOSIS — I872 Venous insufficiency (chronic) (peripheral): Secondary | ICD-10-CM

## 2011-01-21 DIAGNOSIS — M542 Cervicalgia: Secondary | ICD-10-CM

## 2011-01-21 DIAGNOSIS — D649 Anemia, unspecified: Secondary | ICD-10-CM

## 2011-01-21 DIAGNOSIS — I509 Heart failure, unspecified: Secondary | ICD-10-CM

## 2011-01-21 DIAGNOSIS — Z23 Encounter for immunization: Secondary | ICD-10-CM

## 2011-01-21 DIAGNOSIS — I359 Nonrheumatic aortic valve disorder, unspecified: Secondary | ICD-10-CM

## 2011-01-21 DIAGNOSIS — K5730 Diverticulosis of large intestine without perforation or abscess without bleeding: Secondary | ICD-10-CM

## 2011-01-21 DIAGNOSIS — K219 Gastro-esophageal reflux disease without esophagitis: Secondary | ICD-10-CM

## 2011-01-21 DIAGNOSIS — F411 Generalized anxiety disorder: Secondary | ICD-10-CM

## 2011-01-21 LAB — HEPATIC FUNCTION PANEL
Bilirubin, Direct: 0 mg/dL (ref 0.0–0.3)
Total Protein: 6.6 g/dL (ref 6.0–8.3)

## 2011-01-21 LAB — BASIC METABOLIC PANEL
CO2: 27 mEq/L (ref 19–32)
Calcium: 8.8 mg/dL (ref 8.4–10.5)
Creatinine, Ser: 0.7 mg/dL (ref 0.4–1.2)

## 2011-01-21 LAB — CBC WITH DIFFERENTIAL/PLATELET
Basophils Relative: 0.4 % (ref 0.0–3.0)
Eosinophils Absolute: 0 10*3/uL (ref 0.0–0.7)
Eosinophils Relative: 0.5 % (ref 0.0–5.0)
Hemoglobin: 13.1 g/dL (ref 12.0–15.0)
Lymphocytes Relative: 18.2 % (ref 12.0–46.0)
MCHC: 33.6 g/dL (ref 30.0–36.0)
Monocytes Relative: 5.6 % (ref 3.0–12.0)
Neutro Abs: 5.2 10*3/uL (ref 1.4–7.7)
RBC: 4.09 Mil/uL (ref 3.87–5.11)

## 2011-01-21 NOTE — Progress Notes (Signed)
Subjective:    Patient ID: Andrea Rubio, female    DOB: [birth year], 77 y.o.   MRN: [id number]  HPI 77 y/o WF here for a follow up visit... she has multiple medical problems as noted below...   ~  May11:  she saw TP 3/11 w/ skin tear left forearm from near fall- treated in wound care clinic & improved... labs showed Hg=12.5 but Fe=22  therefore started on FeSO4 Bid but she still feels as though her iron is low, she says> discussed checking labs again today (Hg=13, Fe=57, sat=23%)... otherw stable- breathing is OK using nebs, Advair, Pred... BP controlled on meds; no ch in dizziness, denies syncope etc;  severe arthritic changes and able to make it at home w/ help of her son... ~  Sep11:  she has severe DJD/ Gout on Uloric40 w/ Uric=3.2 May11... c/o bilat leg pain & known lumbar disc dis followed by Susann Givens & needs f/u appt & reassessment (prev shots in back neck knees w/o much help)... she doesn't want Codeine/ Vicodin so we will Rx w/ Tramadol/ Tylenol in the interim... otherw stable & managing at home w/ son's help- breathing stable w/o exac;  BP controlled on meds;  OK Flu shot today.  ~  February 19, 2010:  4 mo ROV- c/o incr urination on the Torsemide 20mg /d so she cut it back on her own to 1/2 tab daily but notes incr swelling & BNP= 1445> we discussed need to incr back to 20mg /d... also c/o "hard feeling" in abd- denies n/v, d/c, or blood & we discussed need for further eval w/ XRay Abd & poss CT vs eval by GI if nec (XRay shows stool throughout colon- needs lax Rx first w/ Miralax & Senakot-S daily)... Labs show Hg= 13.0, MCV= 96, but Fe= 13 (TIBC=140, Sat=7%) & we will refer to GI for further eval...  ~  Jun 25, 2010:  55mo ROV & post hosp check> she was [place] x2 in Jan2012 by [facility] w/ Diverticulitis, Dehydration (renal insuffic, incr K+, UTI), severe AS & DiastolicCHF>  They consulted DrKadakia for Cards & she has been to his office "he said everything was fine"...  She went to a NH for rehab x75mo  & has been home x9mo now> ambulating w/ walker & having difficulty w/ legs giving out- getting home therapy thru [place]... [facility] records reviewed in detail; we don't have notes from [place]... She requests a new rolling walker- OK...  CXR today shows cardiomeg, sm right effusion, no edema, bilat shoulder arthroplasties, NAD... Labs look pretty good> see below:  ~  September 24, 2010:  19mo ROV & she is stable, holding her own & doing ok at home w/ her son there to help> in fact her CC today is just some nasal drainage, dripping & we discussed trial Atrovent nasal spray for this... She had a UTI 7/12 & was treated, then saw Urology & they found postmenopausal atrophic vaginitis w/ topical estrogen cream Rx & this has helped she reports...  States breathing is OK, BP well controlled on meds & she still sees [place] for Cards> she reports doing well, seen 61mo ago, no ch in meds...  ~  January 21, 2011:  55mo ROV & she had one interval bronchitic infection, treated w/ Augmentin & improved towards baseline but notes that her legs are giving her trouble, weaker, etc; she desperately needs more PT/OT but she refuses!!!  She has macular degen & is legally blind, dependent on her son to provide  help at home;  She notes that 62 y/o sister passed away recently (colon ca) leaving her as the only one of 8 sibs left alive...  AB> she had AB exac 10/12 & saw TP w/ Augmentin Rx & improved towards her baseline; remains on Advair250, NEB w/ Albut, Pred5mg /d...  HBP> on Lisinopril5, Lasix40, K20; BP= 126/72 & she denies CP, palpit, ch in SOB, edema, etc...  Cardia> AS & CHF> followed by ZOXWRUEAV, meds reviewed, we don't have notes from him...  Hyperlipid> states she is off the Simva40 ?on her own or ?stopped by [place]; she did not bring med list or bottles to review; asked to have Cards send notes to me...  HH, GERD, Divertics> on Prilosec20 & Metaclop10qhs; sister died w/ colon ca- pts last colonoscopy 6/06 by  [facility] w/o polyps (+divertics & ischemic colitis)...  DJD, Gout, LBP> on Uloric40 & Tramadol50 prn; she has severe deformities & difficulty getting up & about; she desperately needs more PT but she refuses...  Anxiety> she takes [id number] but declines anxiolytic rx...          Problem List:  MACULAR DEGENERATION (ICD-362.50) - she is legally blind & son helps out at home...  SINUSITIS, CHRONIC (ICD-473.9) - she is s/p bliat [facility] 1986 by DrKraus... ~  CT Sinus 11/09 showed chr sinusitis changes in the max & sphenoid regions... ~  8/12: she is c/o drippy nose, trial Atrovent nasal spray...  ASTHMATIC BRONCHITIS, ACUTE (ICD-466.0) - on PRED 5mg /d, ADVAIR 250Bid, & ALBUTEROL NEBS Tid... stable- min cough, w/o sputum, no change in DOE, etc... ~  PFT's 3/06 showed FVC= 1.49 (68%), FEV1= 1.12 (75%), %1sec=75, mid-flows= 43%pred... ~  [facility] 8/09 w/ bilat pneumonia... see DC Summary. ~  CXR 10/09 showed stable cardiomeg, tort Ao, clear lungs, bilat shoulder arthroplasties... ~  CT Angio Chest 11/09 showed neg for PE, fluid/ mucus plugs in depend LL bronchi... ~  CXR 10/10 showed stable cardiomeg, bilat humeral head prostheses, NAD.Marland Kitchen. ~  CXR 1/12 showed COPD, chr changes, ?right basilar opacity ==> subsequent films w/ incr edema. ~  CXR 5/12 showed cardiomeg, sm right effusion, no edema, bilat shoulder arthroplasties, NAD... ~  10/12:  AB exac treated by TP w/ Augmentin & improved...  HYPERTENSION (ICD-401.9) - on LISINOPRIL 5mg /d, FUROSEMIDE 40mg /d & K20/d...   ~  8/12: her BP's have stabilized at home and measures 112/68 here today... she denies CP, palpit, syncope, ch in edema... ~  12/12: BP= 126/72 & clinically stable w/o CP, palpit, ch in SOB, etc...  AORTIC STENOSIS (ICD-424.1)   << followed by [id number] for Cards >> Hx of CHF (ICD-428.0)  ~  2DEcho 3/06 showed mild AS/AI w/ mod incr AoV thickness & reduced leaflet excursion, mild MR, norm EF=55-65%...  ~  2DEcho 12/08  showed mod inc AoV thickness, mod reduced AV leaflet excursion, c/w mod AS, mild AI & mean grad= ;  norm LVF w/ EF= 65% and no wall motion abn etc... ~  2DEcho 1/12 in [place] showed severeAS & mildAI, mild conc LVH, norm sys func w/ EF=60-65%, HK at apex, grI DD... ~  EKG 1/12 showed NSR, poor R progression, NSSTTWA... ~  We do not have follow up notes from [facility], pt indicates no change in meds...  PERIPHERAL VASCULAR DISEASE (ICD-443.9) - on ASA 81mg /d... she can't walk enough to elicit discomfort, no rest pain or lesions... ~  ABI's 12/01 showed .88 on R, and .95 on L... ~  MRI showed small vessel dis &  small infarct noted...  VENOUS INSUFFICIENCY (ICD-459.81) - on low sodium diet, Lasix, KCl...  HYPERLIPIDEMIA (ICD-272.4) - prev on ZOCOR 40mg  per WUJWJXBJY- ?off now;  (prev on Pravachol, but stated intol to other Statins)... takes Fish Oil ~2000mg /d rec by her ophthalmologist... ~  FLP 9/05 ?on Prav40 showed TChol 176, TG 190, HDL 55, LDL 83... ~  FLP 4/10 on diet alone showed TChol 211, TG 153, HDL 44, LDL 128... she refuses med Rx- continue diet efforts. ~  1/12:  Hosp by [facility] & consulted DrKadakia who started her on ZOCOR40mg /d> he is checking her labs now... ~  12/12: she crossed Simva40 off her list ?stopped on her own or ?stopped by [place]...  HIATAL HERNIA (ICD-553.3) - on PRILOSEC 20mg /d & METACLOPRAMIDE 10mg Qhs... GERD (ICD-530.81) - EGD 3/06 by DrStark showed 3cmHH, mild esophagitis...  DIVERTICULOSIS, COLON (ICD-562.10) - last colonoscopy was 6/06 by [facility] showed divertics and ischemic colitis...  ~  Adm 1/12 by [facility] w/ Diverticulitis, treated & improved, she has not followed up w/ GI; she take 1/2 capful of MIRALAX daily.  STRESS INCONTINENCE (ICD-788.39) - she has hx UTI's & dysuria on & off... ~  10/10: urine c/s +EColi & Rx'd w/ Cipro. ~  7/12: she reports vaginal estrogen per Urology has helped her urinary symptoms; note from DrOttelin reviewed. ~  Labs 12/12 w/  normal renal function & Creat=0.7  DEGENERATIVE JOINT DISEASE (ICD-715.90) - she has severe DJD & Gout w/ deformities- Foot XRay showed Charcot foot deformity, severe degenerative dis, etc...  added TRAMADOL + TYLENOL Prn... GOUT, UNSPECIFIED (ICD-274.9) - labs 4/10 showed Uric Acid level= 7.9.Marland KitchenMarland Kitchen she has hx of gout & hyperuricemia but allergic to Allopurinol w/ rash... therefore on ULORIC 40mg /d... Uric Acid level 5/11 on Uloric40 = 3.2 LOW BACK PAIN (ICD-724.2) NECK PAIN (ICD-723.1) - eval by Susann Givens ==> DrLewitt 2010 w/ MRI & Rx  w/ Lidoderm, Tylenol, Lyrica- now on NEURONTIN... OSTEOPOROSIS (ICD-733.00) - prev on Fosamax (stopped 1/12 in hosp "my kidneys can't handle it"); on Ca++, Vits, & Vit D... ~  labs 4/10 showed Vit D level = 28... rec> start Vit D OTC 1000 u daily. ~  labs 5/11 showed Vit D level = 32... rec incr Vit D to 2000 u daily.  ANXIETY (ICD-300.00) - off prev Alpraz Rx & takes AMITRIPTYLINE 25mg  Qhs... under stress w/ 75 y/o son had MI & stent in 2010.  Hx of ANEMIA (ICD-285.9) - hx anemia req 2U transfusion 6/06 hosp for ischemic colitis...  ~  Hg= 12 - 13 over 2008-9. ~  labs 8/09 hosp showed Hg= 14 ~  labs 4/10 showed Hg= 12.2 ~  labs 3/11 showed Hg= 12.5, MCV= 92, Fe= 22 (9%sat)... start FeSO4 Bid. ~  labs 5/11 showed = 13.0, MCV= 94, Fe= 57 (23%)... continue Fe supplement. ~  Labs 12/12 showed Hg= 13.1, Fe= 35, (14%sat)... rec to continue Fe+VitC Bid...  DERM>  She has skin cancer removed from the right side of her face by DrDanJones [phone])...   Past Surgical History  Procedure Date  . Lumbar laminectomy   . Shoulder surgery     bilateral    Outpatient Encounter Prescriptions as of 01/21/2011  Medication Sig Dispense Refill  . ADVAIR DISKUS 250-50 MCG/DOSE AEPB USE 1 PUFF TWICE DAILY.  60 each  11  . albuterol (PROVENTIL) (2.5 MG/3ML) 0.083% nebulizer solution Take 2.5 mg by nebulization 3 (three) times daily as needed.        Marland Kitchen amitriptyline (ELAVIL)  25 MG  tablet TAKE ONE TABLET AT BEDTIME.  30 tablet  5  . aspirin 81 MG tablet Take 81 mg by mouth daily.        . Calcium Carbonate-Vitamin D (CALCIUM 600+D) 600-400 MG-UNIT per tablet Take 1 tablet by mouth daily.       . Cholecalciferol (VITAMIN D) 1000 UNITS capsule Take 1,000 Units by mouth daily.        Marland Kitchen conjugated estrogens (PREMARIN) vaginal cream Every other day.      . ferrous gluconate (FERGON) 325 MG tablet Take 325 mg by mouth 2 (two) times daily with a meal.        . furosemide (LASIX) 40 MG tablet Take 40 mg by mouth daily.        Marland Kitchen ipratropium (ATROVENT) 0.06 % nasal spray Place 2 sprays into the nose 4 (four) times daily.  15 mL  11  . IRON PO Take by mouth. 2 tabs in the morning and 2 tabs at bedtime; mg unknown       . K-DUR 20 MEQ tablet TAKE 1 TABLET ONCE DAILY.  30 each  11  . lisinopril (PRINIVIL,ZESTRIL) 5 MG tablet TAKE 1 TABLET ONCE DAILY.  30 tablet  11  . LOTRISONE cream APPLY TO RASH TWICE DAILY.  15 g  6  . Multiple Vitamin (MULTIVITAMIN) capsule Take 1 capsule by mouth daily.        Marland Kitchen omeprazole (PRILOSEC) 20 MG capsule Take 20 mg by mouth daily.        . polyethylene glycol powder (GLYCOLAX/MIRALAX) powder Take 17 g by mouth daily.        . predniSONE (DELTASONE) 5 MG tablet Take 1 tablet (5 mg total) by mouth daily.  60 tablet  5  . PRILOSEC OTC 20 MG tablet Daily.      . REGLAN 10 MG tablet TAKE ONE TABLET AT BEDTIME.  30 each  11  . simvastatin (ZOCOR) 40 MG tablet Take 40 mg by mouth at bedtime.        Marland Kitchen ULORIC 40 MG tablet TAKE 1 TABLET EACH DAY.  30 each  6  . ULTRAM 50 MG tablet TAKE ONE TABLET EVERY 6 HOURS AS NEEDED FOR PAIN (MAY TAKE WITH TYLENOL).  50 each  5    Allergies  Allergen Reactions  . Allopurinol     REACTION: rash  . Codeine   . Hydrocodone     REACTION: hallucinations  . Morphine     REACTION: hallucinations    Current Medications, Allergies, Past Medical History, Past Surgical History, Family History, and Social History were  reviewed in [employer] record.    Review of Systems        See HPI - all other systems neg except as noted... The patient complains of dyspnea on exertion, peripheral edema, abdominal pain, severe indigestion/heartburn, muscle weakness, and difficulty walking.  The patient denies anorexia, fever, weight loss, weight gain, vision loss, decreased hearing, hoarseness, chest pain, syncope, prolonged cough, headaches, hemoptysis, melena, hematochezia, hematuria, incontinence, suspicious skin lesions, transient blindness, depression, unusual weight change, abnormal bleeding, enlarged lymph nodes, and angioedema.     Objective:   Physical Exam    WD, Petite, sl overweight, 77 y/o WF in NAD... she is <5' tall, weight 140# GENERAL:  Alert & oriented; pleasant & cooperative... HEENT:  /AT, EACs-clear, TMs-wnl, NOSE-clear, THROAT-clear & wnl... known macular degen per ophthal... NECK:  Supple w/ decrROM; no JVD; normal carotid impulses w/o bruits; no thyromegaly  or nodules palpated; no lymphadenopathy. CHEST:  Decr BS bilat, Coarse BS, no wheezing or rhonchi... HEART:  Regular Rhythm;  gr 1-2/6 SEM without rubs or gallops detected... ABDOMEN:  Soft & nontender; normal bowel sounds; no organomegaly or masses palpated... EXT: +hand deformities, w/ tophi & mod arthritic changes & contractures... right knee arthritis & severe foot deformities as well. no varicose veins/+venous insuffic/ tr edema. NEURO:  CN's intact x reduced vision... diffusely weak, without focal changes... +gait abn... DERM:  no lesions- ecchymoses, thin skin, etc...  RADIOLOGY DATA:  Reviewed in the EPIC EMR & discussed w/ the patient...  LABORATORY DATA:  Reviewed in the EPIC EMR & discussed w/ the patient...   Assessment & Plan:   AB>  Breathing is stable & she has been able to avoid URIs etc; continue Pred, Advair, Nebs.  HBP & Diastolic CHF>  Controlled on meds- Lisinopril & Lasix; continue same;  she will f/u w/ ZOXWRUEAV as planned.  AS>  Followed by WUJWJXBJY for her severe AS, not a surg candidate...  HYPERLIPID>  Off Simva40 now ?per Cards or on her own; she will check w/ [id number]...  GI>  Hx HH, GERD, Divertics, Colitis>  On Prilosec, Reglan 10mg  Qhs (she does not want to stop this med), & Miralax as noted...  GU>  Dysuria improved w/ vaginal cream, needs to take it regularly...  DJD/ Gout/ Neck&Back Pain>  As noted- on Tramadol, Tylenol, Uloric; she is weak in the legs, hard to stand & walk, I'm afraid she will become immobile, unable to stand or walk making self care difficult but she is unperturbed by this & refuses my rec for more home PT...  Anxiety>  She has Amitriptyline 25Qhs, not on anxiolytic.Marland KitchenMarland Kitchen

## 2011-01-21 NOTE — Patient Instructions (Signed)
Today we updated your med list in our EPIC system...    Continue your current medications the same...    We recommend that you decrease the IRON tab to one daily...  Today we did your follow up blood work...    Please call the PHONE TREE in a few days for your results...    Dial [id number] & when prompted enter your patient number followed by the # symbol...    Your patient number is:  [id number]#  Stay as active as possible & do your home "therapy"...    Let me know if you want [place] to order some more formal therapy sessions for you...  Call for any questions...  Let's plan a follow up visit in another 4 months.Marland KitchenMarland Kitchen

## 2011-01-22 LAB — IBC PANEL
Saturation Ratios: 14.1 % — ABNORMAL LOW (ref 20.0–50.0)
Transferrin: 176.9 mg/dL — ABNORMAL LOW (ref 212.0–360.0)

## 2011-01-25 ENCOUNTER — Other Ambulatory Visit: Payer: Self-pay | Admitting: Pulmonary Disease

## 2011-01-31 ENCOUNTER — Encounter: Payer: Self-pay | Admitting: Pulmonary Disease

## 2011-02-05 ENCOUNTER — Other Ambulatory Visit: Payer: Self-pay | Admitting: Pulmonary Disease

## 2011-02-12 ENCOUNTER — Other Ambulatory Visit: Payer: Self-pay | Admitting: Pulmonary Disease

## 2011-02-12 ENCOUNTER — Telehealth: Payer: Self-pay | Admitting: Pulmonary Disease

## 2011-02-12 NOTE — Telephone Encounter (Signed)
Notes Recorded by Michele Mcalpine, MD on 01/31/2011 at 6:25 PM Labs reviewed & phone tree recorded... SMN Chems ok x sl elev BS> diet rx... CBC ok but Fe sl low> rec to continue Fe+ VitC Bid... I spoke with patient about results and she verbalized understanding and had no questions

## 2011-02-26 ENCOUNTER — Other Ambulatory Visit: Payer: Self-pay | Admitting: Pulmonary Disease

## 2011-05-27 ENCOUNTER — Ambulatory Visit: Payer: Medicare Other | Admitting: Pulmonary Disease

## 2011-05-27 ENCOUNTER — Ambulatory Visit (INDEPENDENT_AMBULATORY_CARE_PROVIDER_SITE_OTHER): Payer: Medicare Other | Admitting: Pulmonary Disease

## 2011-05-27 ENCOUNTER — Encounter: Payer: Self-pay | Admitting: Pulmonary Disease

## 2011-05-27 VITALS — BP 108/62 | HR 78 | Temp 97.2°F | Ht 59.0 in | Wt 142.8 lb

## 2011-05-27 DIAGNOSIS — I739 Peripheral vascular disease, unspecified: Secondary | ICD-10-CM

## 2011-05-27 DIAGNOSIS — I359 Nonrheumatic aortic valve disorder, unspecified: Secondary | ICD-10-CM

## 2011-05-27 DIAGNOSIS — E785 Hyperlipidemia, unspecified: Secondary | ICD-10-CM

## 2011-05-27 DIAGNOSIS — F411 Generalized anxiety disorder: Secondary | ICD-10-CM

## 2011-05-27 DIAGNOSIS — K449 Diaphragmatic hernia without obstruction or gangrene: Secondary | ICD-10-CM

## 2011-05-27 DIAGNOSIS — K5730 Diverticulosis of large intestine without perforation or abscess without bleeding: Secondary | ICD-10-CM

## 2011-05-27 DIAGNOSIS — I872 Venous insufficiency (chronic) (peripheral): Secondary | ICD-10-CM

## 2011-05-27 DIAGNOSIS — J209 Acute bronchitis, unspecified: Secondary | ICD-10-CM

## 2011-05-27 DIAGNOSIS — N39498 Other specified urinary incontinence: Secondary | ICD-10-CM

## 2011-05-27 DIAGNOSIS — M810 Age-related osteoporosis without current pathological fracture: Secondary | ICD-10-CM

## 2011-05-27 DIAGNOSIS — I10 Essential (primary) hypertension: Secondary | ICD-10-CM

## 2011-05-27 DIAGNOSIS — M1990 Unspecified osteoarthritis, unspecified site: Secondary | ICD-10-CM

## 2011-05-27 DIAGNOSIS — I509 Heart failure, unspecified: Secondary | ICD-10-CM

## 2011-05-27 NOTE — Progress Notes (Signed)
Subjective:    Patient ID: Andrea Rubio, female    DOB: [birth year], 78 y.o.   MRN: [id number]  HPI 78 y/o WF here for a follow up visit... she has multiple medical problems as noted below...   ~  February 19, 2010:  4 mo ROV- c/o incr urination on the Torsemide 20mg /d so she cut it back on her own to 1/2 tab daily but notes incr swelling & BNP= 1445> we discussed need to incr back to 20mg /d... also c/o "hard feeling" in abd- denies n/v, d/c, or blood & we discussed need for further eval w/ XRay Abd & poss CT vs eval by GI if nec (XRay shows stool throughout colon- needs lax Rx first w/ Miralax & Senakot-S daily)... Labs show Hg= 13.0, MCV= 96, but Fe= 13 (TIBC=140, Sat=7%) & we will refer to GI for further eval...  ~  Jun 25, 2010:  440mo ROV & post hosp check> she was [name] x2 in Jan2012 [facility] w/ Diverticulitis, Dehydration (renal insuffic, incr K+, UTI), severe AS & DiastolicCHF>  They consulted DrKadakia for Cards & she has been to his office "he said everything was fine"...  She went to a NH for rehab x5440mo & has been home x682mo now> ambulating w/ walker & having difficulty w/ legs giving out- getting home therapy thru [place]... [facility] records reviewed in detail; we don't have notes from [place]... She requests a new rolling walker- OK...  CXR today shows cardiomeg, sm right effusion, no edema, bilat shoulder arthroplasties, NAD... Labs look pretty good> see below:  ~  September 24, 2010:  382mo ROV & she is stable, holding her own & doing ok at home w/ her son there to help> in fact her CC today is just some nasal drainage, dripping & we discussed trial Atrovent nasal spray for this... She had a UTI 7/12 & was treated, then saw Urology & they found postmenopausal atrophic vaginitis w/ topical estrogen cream Rx & this has helped she reports...  States breathing is OK, BP well controlled on meds & she still sees [place] for Cards> she reports doing well, seen 82mo ago, no ch in meds...  ~  January 21, 2011:  440mo ROV & she had one interval bronchitic infection, treated w/ Augmentin & improved towards baseline but notes that her legs are giving her trouble, weaker, etc; she desperately needs more PT/OT but she refuses!!!  She has macular degen & is legally blind, dependent on her son to provide help at home;  She notes that 2 y/o sister passed away recently (colon ca) leaving her as the only one of 8 sibs left alive...  AB> she had AB exac 10/12 & saw TP w/ Augmentin Rx & improved towards her baseline; remains on Advair250, NEB w/ Albut, Pred5mg /d...  HBP> on Lisinopril5, Lasix40, K20; BP= 126/72 & she denies CP, palpit, ch in SOB, edema, etc...  Cardia> AS & CHF> followed by WUJWJXBJY, meds reviewed, we don't have notes from him...  Hyperlipid> states she is off the Simva40 ?on her own or ?stopped by [place]; she did not bring med list or bottles to review; asked to have Cards send notes to me...  HH, GERD, Divertics> on Prilosec20 & Metaclop10qhs; sister died w/ colon ca- pts last colonoscopy 6/06 by [facility] w/o polyps (+divertics & ischemic colitis)...  DJD, Gout, LBP> on Uloric40 & Tramadol50 prn; she has severe deformities & difficulty getting up & about; she desperately needs more PT but she refuses.Marland KitchenMarland Kitchen  Anxiety> she takes [id number] but declines anxiolytic rx... LABS 12/12:  Chems- wnl x BS=132;  CBC- wnl w/ Hg=13.1;  Iron- sl low at 35 (14%sat);  TSH= 0.87  ~  May 27, 2011:  38mo ROV & Rosalinda fell in her bathroom several weeks ago- hit the back of her head & left arm abrasion; EMS checked her & dressed her wound and she decided to see her local physician DrWElkins; he redressed the left arm abrasion & reassured her;  We rechecked her occipital area- sm scab ok to wash etc, and left arm abrasion healing nicely; She knows to be more careful to avoid falls etc... We reviewed her meds, problem list, & prev labs> see below>>    She tells me she saw [facility] for Cards f/u about a month ago>  stable, no changes made...   Problem List:    << PROBLEM LIST UPDATED 05/27/11 >>  MACULAR DEGENERATION (ICD-362.50) - she is legally blind & son helps out at home...  SINUSITIS, CHRONIC (ICD-473.9) - she is s/p bliat [facility] 1986 by DrKraus... ~  CT Sinus 11/09 showed chr sinusitis changes in the max & sphenoid regions... ~  8/12: she is c/o drippy nose, trial Atrovent nasal spray...  ASTHMATIC BRONCHITIS, ACUTE (ICD-466.0) - on PRED 5mg /d, ADVAIR 250Bid, & ALBUTEROL NEBS Tid... stable- min cough, w/o sputum, no change in DOE, etc... ~  PFT's 3/06 showed FVC= 1.49 (68%), FEV1= 1.12 (75%), %1sec=75, mid-flows= 43%pred... ~  [facility] 8/09 w/ bilat pneumonia... see DC Summary. ~  CXR 10/09 showed stable cardiomeg, tort Ao, clear lungs, bilat shoulder arthroplasties... ~  CT Angio Chest 11/09 showed neg for PE, fluid/ mucus plugs in depend LL bronchi... ~  CXR 10/10 showed stable cardiomeg, bilat humeral head prostheses, NAD.Marland Kitchen. ~  CXR 1/12 showed COPD, chr changes, ?right basilar opacity ==> subsequent films w/ incr edema. ~  CXR 5/12 showed cardiomeg, sm right effusion, no edema, bilat shoulder arthroplasties, NAD... ~  10/12:  AB exac treated by TP w/ Augmentin & improved...  HYPERTENSION (ICD-401.9) - on LISINOPRIL 5mg /d, FUROSEMIDE 40mg /d & K20/d...   ~  8/12: her BP's have stabilized at home and measures 112/68 here today... she denies CP, palpit, syncope, ch in edema... ~  12/12: BP= 126/72 & clinically stable w/o CP, palpit, ch in SOB, etc... ~  4/13:  BP= 108/62 & usually sl better at home; still denies any CP, palpit, ch in SOB, etc...  AORTIC STENOSIS (ICD-424.1)   << followed by [id number] for Cards >> Hx of CHF (ICD-428.0)  ~  2DEcho 3/06 showed mild AS/AI w/ mod incr AoV thickness & reduced leaflet excursion, mild MR, norm EF=55-65%...  ~  2DEcho 12/08 showed mod inc AoV thickness, mod reduced AV leaflet excursion, c/w mod AS, mild AI & mean grad= ;  norm LVF w/ EF=  65% and no wall motion abn etc... ~  2DEcho 1/12 in [place] showed severeAS & mildAI, mild conc LVH, norm sys func w/ EF=60-65%, HK at apex, grI DD... ~  EKG 1/12 showed NSR, poor R progression, NSSTTWA... ~  We do not have follow up notes from [facility], pt indicates no change in meds...  PERIPHERAL VASCULAR DISEASE (ICD-443.9) - on ASA 81mg /d... she can't walk enough to elicit discomfort, no rest pain or lesions... ~  ABI's 12/01 showed .88 on R, and .95 on L... ~  MRI showed small vessel dis & small infarct noted...  VENOUS INSUFFICIENCY (ICD-459.81) - on low sodium diet, Lasix, KCl.Marland KitchenMarland Kitchen  HYPERLIPIDEMIA (ICD-272.4) - prev on ZOCOR 40mg  per [id number]- ?off now;  (prev on Pravachol, but stated intol to other Statins)... takes Fish Oil ~2000mg /d rec by her ophthalmologist... ~  FLP 9/05 ?on Prav40 showed TChol 176, TG 190, HDL 55, LDL 83... ~  FLP 4/10 on diet alone showed TChol 211, TG 153, HDL 44, LDL 128... she refuses med Rx- continue diet efforts. ~  1/12:  Hosp by [facility] & consulted DrKadakia who started her on ZOCOR40mg /d> he is checking her labs now... ~  12/12: she crossed Simva40 off her list ?stopped on her own or ?stopped by [place]...  HIATAL HERNIA (ICD-553.3) - on PRILOSEC 20mg /d & METACLOPRAMIDE 10mg Qhs... GERD (ICD-530.81) - EGD 3/06 by DrStark showed 3cmHH, mild esophagitis...  DIVERTICULOSIS, COLON (ICD-562.10) - last colonoscopy was 6/06 by [facility] showed divertics and ischemic colitis...  ~  Adm 1/12 by [facility] w/ Diverticulitis, treated & improved, she has not followed up w/ GI; she take 1/2 capful of MIRALAX daily.  STRESS INCONTINENCE (ICD-788.39) - she has hx UTI's & dysuria on & off... ~  10/10: urine c/s +EColi & Rx'd w/ Cipro. ~  7/12: she reports vaginal estrogen per Urology has helped her urinary symptoms; note from DrOttelin reviewed. ~  Labs 12/12 w/ normal renal function & Creat=0.7  DEGENERATIVE JOINT DISEASE (ICD-715.90) - she has severe DJD & Gout w/ deformities- Foot  XRay showed Charcot foot deformity, severe degenerative dis, etc...  added TRAMADOL + TYLENOL Prn... GOUT, UNSPECIFIED (ICD-274.9) - labs 4/10 showed Uric Acid level= 7.9.Marland KitchenMarland Kitchen she has hx of gout & hyperuricemia but allergic to Allopurinol w/ rash... therefore on ULORIC 40mg /d... Uric Acid level 5/11 on Uloric40 = 3.2 LOW BACK PAIN (ICD-724.2) NECK PAIN (ICD-723.1) - eval by Susann Givens ==> DrLewitt 2010 w/ MRI & Rx  w/ Lidoderm, Tylenol, Lyrica- now on NEURONTIN... OSTEOPOROSIS (ICD-733.00) - prev on Fosamax (stopped 1/12 in hosp "my kidneys can't handle it"); on Ca++, Vits, & Vit D... ~  labs 4/10 showed Vit D level = 28... rec> start Vit D OTC 1000 u daily. ~  labs 5/11 showed Vit D level = 32... rec incr Vit D to 2000 u daily.  ANXIETY (ICD-300.00) - off prev Alpraz Rx & takes AMITRIPTYLINE 25mg  Qhs... under stress w/ 72 y/o son had MI & stent in 2010.  Hx of ANEMIA (ICD-285.9) - hx anemia req 2U transfusion 6/06 hosp for ischemic colitis...  ~  Hg= 12 - 13 over 2008-9. ~  labs 8/09 hosp showed Hg= 14 ~  labs 4/10 showed Hg= 12.2 ~  labs 3/11 showed Hg= 12.5, MCV= 92, Fe= 22 (9%sat)... start FeSO4 Bid. ~  labs 5/11 showed = 13.0, MCV= 94, Fe= 57 (23%)... continue Fe supplement. ~  Labs 12/12 showed Hg= 13.1, Fe= 35, (14%sat)... rec to continue Fe+VitC Bid...  DERM>  She has skin cancer removed from the right side of her face by DrDanJones [phone])...   Past Surgical History  Procedure Date  . Lumbar laminectomy   . Shoulder surgery     bilateral    Outpatient Encounter Prescriptions as of 05/27/2011  Medication Sig Dispense Refill  . ADVAIR DISKUS 250-50 MCG/DOSE AEPB USE 1 PUFF TWICE DAILY.  60 each  11  . albuterol (PROVENTIL) (2.5 MG/3ML) 0.083% nebulizer solution Take 2.5 mg by nebulization 3 (three) times daily as needed.        Marland Kitchen amitriptyline (ELAVIL) 25 MG tablet TAKE ONE TABLET AT BEDTIME.  30 tablet  11  . aspirin 81 MG  tablet Take 81 mg by mouth daily.        . Calcium  Carbonate-Vitamin D (CALCIUM 600+D) 600-400 MG-UNIT per tablet Take 1 tablet by mouth daily.       . Cholecalciferol (VITAMIN D) 1000 UNITS capsule Take 1,000 Units by mouth daily.        Marland Kitchen conjugated estrogens (PREMARIN) vaginal cream daily.       . DELTASONE 5 MG tablet TAKE 1 OR 2 TABLETS DAILY.  60 each  5  . ferrous gluconate (FERGON) 325 MG tablet Take 325 mg by mouth daily with breakfast.       . furosemide (LASIX) 40 MG tablet Take 40 mg by mouth daily.        Marland Kitchen ipratropium (ATROVENT) 0.06 % nasal spray Place 2 sprays into the nose 4 (four) times daily.  15 mL  11  . K-DUR 20 MEQ tablet TAKE 1 TABLET ONCE DAILY.  30 each  11  . lisinopril (PRINIVIL,ZESTRIL) 5 MG tablet TAKE 1 TABLET ONCE DAILY.  30 tablet  11  . LOTRISONE cream APPLY TO RASH TWICE DAILY.  15 g  6  . Multiple Vitamin (MULTIVITAMIN) capsule Take 1 capsule by mouth daily.        Marland Kitchen omeprazole (PRILOSEC) 20 MG capsule Take 20 mg by mouth daily.        . REGLAN 10 MG tablet TAKE ONE TABLET AT BEDTIME.  30 each  11  . simvastatin (ZOCOR) 40 MG tablet Take 40 mg by mouth at bedtime.        Marland Kitchen ULORIC 40 MG tablet TAKE 1 TABLET EACH DAY.  30 each  6  . ULTRAM 50 MG tablet TAKE ONE TABLET EVERY 6 HOURS AS NEEDED FOR PAIN (MAY TAKE WITH TYLENOL).  90 each  5    Allergies  Allergen Reactions  . Allopurinol     REACTION: rash  . Codeine   . Hydrocodone     REACTION: hallucinations  . Morphine     REACTION: hallucinations    Current Medications, Allergies, Past Medical History, Past Surgical History, Family History, and Social History were reviewed in [employer] record.    Review of Systems        See HPI - all other systems neg except as noted... The patient complains of dyspnea on exertion, peripheral edema, abdominal pain, severe indigestion/heartburn, muscle weakness, and difficulty walking.  The patient denies anorexia, fever, weight loss, weight gain, vision loss, decreased hearing,  hoarseness, chest pain, syncope, prolonged cough, headaches, hemoptysis, melena, hematochezia, hematuria, incontinence, suspicious skin lesions, transient blindness, depression, unusual weight change, abnormal bleeding, enlarged lymph nodes, and angioedema.     Objective:   Physical Exam    WD, Petite, sl overweight, 78 y/o WF in NAD... she is <5' tall, weight 140# GENERAL:  Alert & oriented; pleasant & cooperative... HEENT:  [place]/AT, EACs-clear, TMs-wnl, NOSE-clear, THROAT-clear & wnl... known macular degen per ophthal... NECK:  Supple w/ decrROM; no JVD; normal carotid impulses w/o bruits; no thyromegaly or nodules palpated; no lymphadenopathy. CHEST:  Decr BS bilat, Coarse BS, no wheezing or rhonchi... HEART:  Regular Rhythm;  gr 1-2/6 SEM without rubs or gallops detected... ABDOMEN:  Soft & nontender; normal bowel sounds; no organomegaly or masses palpated... EXT: +hand deformities, w/ tophi & mod arthritic changes & contractures... right knee arthritis & severe foot deformities as well. no varicose veins/+venous insuffic/ tr edema. NEURO:  CN's intact x reduced vision... diffusely weak, without focal changes... +  gait abn... DERM:  Abrasion over occiput & on left arm; +ecchymoses, thin skin, etc...  RADIOLOGY DATA:  Reviewed in the EPIC EMR & discussed w/ the patient...  LABORATORY DATA:  Reviewed in the EPIC EMR & discussed w/ the patient...   Assessment & Plan:   AB>  Breathing is stable & she has been able to avoid URIs etc; continue Pred, Advair, Nebs.  HBP & Diastolic CHF>  Controlled on meds- Lisinopril & Lasix; continue same; she will f/u w/ [id number] as planned.  AS>  Followed by WUJWJXBJY for her severe AS, not a surg candidate...  HYPERLIPID>  Off Simva40 now ?per Cards or on her own; she will check w/ [id number]...  GI>  Hx HH, GERD, Divertics, Colitis>  On Prilosec, Reglan 10mg  Qhs (she does not want to stop this med), & Miralax as noted...  GU>  Dysuria improved w/  vaginal cream, needs to take it regularly...  DJD/ Gout/ Neck&Back Pain>  As noted- on Tramadol, Tylenol, Uloric; she is weak in the legs, hard to stand & walk, I'm afraid she will become immobile, unable to stand or walk making self care difficult but she is unperturbed by this & refuses my rec for more home PT...  Anxiety>  She has Amitriptyline 25Qhs, not on anxiolytic...   Patient's Medications  New Prescriptions   No medications on file  Previous Medications   ADVAIR DISKUS 250-50 MCG/DOSE AEPB    USE 1 PUFF TWICE DAILY.   ALBUTEROL (PROVENTIL) (2.5 MG/3ML) 0.083% NEBULIZER SOLUTION    Take 2.5 mg by nebulization 3 (three) times daily as needed.     AMITRIPTYLINE (ELAVIL) 25 MG TABLET    TAKE ONE TABLET AT BEDTIME.   ASPIRIN 81 MG TABLET    Take 81 mg by mouth daily.     CALCIUM CARBONATE-VITAMIN D (CALCIUM 600+D) 600-400 MG-UNIT PER TABLET    Take 1 tablet by mouth daily.    CHOLECALCIFEROL (VITAMIN D) 1000 UNITS CAPSULE    Take 1,000 Units by mouth daily.     CONJUGATED ESTROGENS (PREMARIN) VAGINAL CREAM    daily.    DELTASONE 5 MG TABLET    TAKE 1 OR 2 TABLETS DAILY.   FERROUS GLUCONATE (FERGON) 325 MG TABLET    Take 325 mg by mouth daily with breakfast.    FUROSEMIDE (LASIX) 40 MG TABLET    Take 40 mg by mouth daily.     IPRATROPIUM (ATROVENT) 0.06 % NASAL SPRAY    Place 2 sprays into the nose 4 (four) times daily.   K-DUR 20 MEQ TABLET    TAKE 1 TABLET ONCE DAILY.   LISINOPRIL (PRINIVIL,ZESTRIL) 5 MG TABLET    TAKE 1 TABLET ONCE DAILY.   LOTRISONE CREAM    APPLY TO RASH TWICE DAILY.   MULTIPLE VITAMIN (MULTIVITAMIN) CAPSULE    Take 1 capsule by mouth daily.     OMEPRAZOLE (PRILOSEC) 20 MG CAPSULE    Take 20 mg by mouth daily.     REGLAN 10 MG TABLET    TAKE ONE TABLET AT BEDTIME.   SIMVASTATIN (ZOCOR) 40 MG TABLET    Take 40 mg by mouth at bedtime.     ULORIC 40 MG TABLET    TAKE 1 TABLET EACH DAY.   ULTRAM 50 MG TABLET    TAKE ONE TABLET EVERY 6 HOURS AS NEEDED FOR PAIN (MAY  TAKE WITH TYLENOL).  Modified Medications   No medications on file  Discontinued Medications   No medications on  file

## 2011-05-27 NOTE — Patient Instructions (Addendum)
Today we updated your med list in our EPIC system...    Continue your current medications the same...  Please be careful> NO FALLING ALLOWED!!!  OK to clean the left arm wound w/ mild soapy water daily...  Call for any questions...  Let's plan a follow up visit in about 4 months, w/ FASTING bllod work around that time...    Call the office during the wk or so prior to visit for blood work.Marland KitchenMarland Kitchen

## 2011-05-29 ENCOUNTER — Other Ambulatory Visit: Payer: Self-pay | Admitting: Pulmonary Disease

## 2011-06-08 ENCOUNTER — Other Ambulatory Visit: Payer: Self-pay | Admitting: *Deleted

## 2011-06-08 ENCOUNTER — Other Ambulatory Visit: Payer: Self-pay | Admitting: Pulmonary Disease

## 2011-06-08 MED ORDER — FUROSEMIDE 40 MG PO TABS: 40.0000 mg | ORAL_TABLET | Freq: Every day | ORAL | Status: DC

## 2011-06-23 ENCOUNTER — Other Ambulatory Visit: Payer: Self-pay | Admitting: Pulmonary Disease

## 2011-07-06 ENCOUNTER — Telehealth: Payer: Self-pay | Admitting: Pulmonary Disease

## 2011-07-06 NOTE — Telephone Encounter (Signed)
Pt is aware of recs from SN and will keep [place] posted if pain persists.

## 2011-07-06 NOTE — Telephone Encounter (Signed)
Spoke with pt. She states had diverticulitis approx 1 yr ago and feels that she is developing this again. She is c/o belly feeling full and appears to be swollen to the point that her clothes do not fit. She has also had increased gas and constipation- last BM 3 days ago. She denies any nausea, vomiting, or abd pain "yet". Has never seen GI. Please advise, thanks! Allergies  Allergen Reactions  . Allopurinol     REACTION: rash  . Codeine   . Hydrocodone     REACTION: hallucinations  . Morphine     REACTION: hallucinations

## 2011-07-06 NOTE — Telephone Encounter (Signed)
Per SN---needs laxative to get good BM to empty the colon.  We will consider ct abd/pelvis if the pain persists.  thanks

## 2011-08-05 ENCOUNTER — Other Ambulatory Visit: Payer: Self-pay | Admitting: Pulmonary Disease

## 2011-08-10 ENCOUNTER — Other Ambulatory Visit: Payer: Self-pay | Admitting: Pulmonary Disease

## 2011-09-10 ENCOUNTER — Other Ambulatory Visit: Payer: Self-pay | Admitting: Pulmonary Disease

## 2011-10-06 ENCOUNTER — Telehealth: Payer: Self-pay | Admitting: Pulmonary Disease

## 2011-10-06 ENCOUNTER — Other Ambulatory Visit: Payer: Self-pay | Admitting: Pulmonary Disease

## 2011-10-06 DIAGNOSIS — D649 Anemia, unspecified: Secondary | ICD-10-CM

## 2011-10-06 DIAGNOSIS — E785 Hyperlipidemia, unspecified: Secondary | ICD-10-CM

## 2011-10-06 DIAGNOSIS — I10 Essential (primary) hypertension: Secondary | ICD-10-CM

## 2011-10-06 NOTE — Telephone Encounter (Signed)
Called and spoke with pt and she is aware of labs in the computer for her in the morning.  Pt voiced her understanding and nothing further is needed.

## 2011-10-06 NOTE — Telephone Encounter (Signed)
I spoke with pt and she stated she has an appt with SN tomorrow and stated she is to have labs done. She is going to go down to the lab in the AM. She is wanting the orders for the labs to be placed in EPIC before she comes tomorrow morning. Please advise SN thanks

## 2011-10-06 NOTE — Telephone Encounter (Signed)
Pt has called back & would like a phone call back at [phone].  Antionette Fairy

## 2011-10-07 ENCOUNTER — Encounter: Payer: Self-pay | Admitting: Pulmonary Disease

## 2011-10-07 ENCOUNTER — Ambulatory Visit (INDEPENDENT_AMBULATORY_CARE_PROVIDER_SITE_OTHER): Payer: Medicare Other | Admitting: Pulmonary Disease

## 2011-10-07 ENCOUNTER — Other Ambulatory Visit (INDEPENDENT_AMBULATORY_CARE_PROVIDER_SITE_OTHER): Payer: Medicare Other

## 2011-10-07 VITALS — BP 116/72 | HR 90 | Temp 98.5°F | Ht 59.0 in | Wt 136.4 lb

## 2011-10-07 DIAGNOSIS — I872 Venous insufficiency (chronic) (peripheral): Secondary | ICD-10-CM

## 2011-10-07 DIAGNOSIS — M109 Gout, unspecified: Secondary | ICD-10-CM

## 2011-10-07 DIAGNOSIS — K5730 Diverticulosis of large intestine without perforation or abscess without bleeding: Secondary | ICD-10-CM

## 2011-10-07 DIAGNOSIS — K449 Diaphragmatic hernia without obstruction or gangrene: Secondary | ICD-10-CM

## 2011-10-07 DIAGNOSIS — K219 Gastro-esophageal reflux disease without esophagitis: Secondary | ICD-10-CM

## 2011-10-07 DIAGNOSIS — M810 Age-related osteoporosis without current pathological fracture: Secondary | ICD-10-CM

## 2011-10-07 DIAGNOSIS — I509 Heart failure, unspecified: Secondary | ICD-10-CM

## 2011-10-07 DIAGNOSIS — I359 Nonrheumatic aortic valve disorder, unspecified: Secondary | ICD-10-CM

## 2011-10-07 DIAGNOSIS — E785 Hyperlipidemia, unspecified: Secondary | ICD-10-CM

## 2011-10-07 DIAGNOSIS — I10 Essential (primary) hypertension: Secondary | ICD-10-CM

## 2011-10-07 DIAGNOSIS — M1990 Unspecified osteoarthritis, unspecified site: Secondary | ICD-10-CM

## 2011-10-07 DIAGNOSIS — D649 Anemia, unspecified: Secondary | ICD-10-CM

## 2011-10-07 DIAGNOSIS — F411 Generalized anxiety disorder: Secondary | ICD-10-CM

## 2011-10-07 DIAGNOSIS — J209 Acute bronchitis, unspecified: Secondary | ICD-10-CM

## 2011-10-07 DIAGNOSIS — M545 Low back pain: Secondary | ICD-10-CM

## 2011-10-07 DIAGNOSIS — N390 Urinary tract infection, site not specified: Secondary | ICD-10-CM

## 2011-10-07 LAB — LIPID PANEL
Cholesterol: 206 mg/dL — ABNORMAL HIGH (ref 0–200)
HDL: 48.4 mg/dL (ref >39.00)
VLDL: 33.4 mg/dL (ref 0.0–40.0)

## 2011-10-07 LAB — CBC WITH DIFFERENTIAL/PLATELET
Basophils Relative: 0.5 % (ref 0.0–3.0)
Eosinophils Relative: 3.4 % (ref 0.0–5.0)
Lymphocytes Relative: 29.7 % (ref 12.0–46.0)
MCV: 94.3 fl (ref 78.0–100.0)
Monocytes Relative: 7 % (ref 3.0–12.0)
Neutrophils Relative %: 59.4 % (ref 43.0–77.0)
Platelets: 193 10*3/uL (ref 150.0–400.0)
RBC: 4.44 Mil/uL (ref 3.87–5.11)
WBC: 7.6 10*3/uL (ref 4.5–10.5)

## 2011-10-07 LAB — BASIC METABOLIC PANEL
BUN: 26 mg/dL — ABNORMAL HIGH (ref 6–23)
Chloride: 105 mEq/L (ref 96–112)
Creatinine, Ser: 0.8 mg/dL (ref 0.4–1.2)
Glucose, Bld: 92 mg/dL (ref 70–99)
Potassium: 4.3 mEq/L (ref 3.5–5.1)

## 2011-10-07 LAB — HEPATIC FUNCTION PANEL
ALT: 20 U/L (ref 0–35)
AST: 25 U/L (ref 0–37)
Albumin: 3.7 g/dL (ref 3.5–5.2)
Total Bilirubin: 0.6 mg/dL (ref 0.3–1.2)

## 2011-10-07 LAB — LDL CHOLESTEROL, DIRECT: Direct LDL: 133.3 mg/dL

## 2011-10-07 MED ORDER — ATORVASTATIN CALCIUM 20 MG PO TABS: 20.0000 mg | ORAL_TABLET | Freq: Every day | ORAL | Status: DC

## 2011-10-07 MED ORDER — KETOCONAZOLE 2 % EX SHAM: MEDICATED_SHAMPOO | CUTANEOUS | Status: DC

## 2011-10-07 NOTE — Patient Instructions (Addendum)
Today we updated your med list in our EPIC system...    Continue your current medications the same...  We decided to start ATORVASTATIN 20mg  for your Cholesterol- take 1 tab daily...    Let's plan a follow up FASTING lipid profile prior to your follow up visit in 4 months...  We need to schedule a repeat bone density test>    We will call you w/ the results when avail...  Call for any problems...    Let's plan a recheck in 4 months.Marland KitchenMarland Kitchen

## 2011-10-07 NOTE — Progress Notes (Signed)
Subjective:    Patient ID: Andrea Rubio, female    DOB: [birth year], 78 y.o.   MRN: [id number]  HPI 78 y/o WF here for a follow up visit... she has multiple medical problems as noted below...   ~  February 19, 2010:  4 mo ROV- c/o incr urination on the Torsemide 20mg /d so she cut it back on her own to 1/2 tab daily but notes incr swelling & BNP= 1445> we discussed need to incr back to 20mg /d... also c/o "hard feeling" in abd- denies n/v, d/c, or blood & we discussed need for further eval w/ XRay Abd & poss CT vs eval by GI if nec (XRay shows stool throughout colon- needs lax Rx first w/ Miralax & Senakot-S daily)... Labs show Hg= 13.0, MCV= 96, but Fe= 13 (TIBC=140, Sat=7%) & we will refer to GI for further eval...  ~  Jun 25, 2010:  440mo ROV & post hosp check> she was [name] x2 in Jan2012 [facility] w/ Diverticulitis, Dehydration (renal insuffic, incr K+, UTI), severe AS & DiastolicCHF>  They consulted DrKadakia for Cards & she has been to his office "he said everything was fine"...  She went to a NH for rehab x5440mo & has been home x682mo now> ambulating w/ walker & having difficulty w/ legs giving out- getting home therapy thru [place]... [facility] records reviewed in detail; we don't have notes from [place]... She requests a new rolling walker- OK...  CXR today shows cardiomeg, sm right effusion, no edema, bilat shoulder arthroplasties, NAD... Labs look pretty good> see below:  ~  September 24, 2010:  382mo ROV & she is stable, holding her own & doing ok at home w/ her son there to help> in fact her CC today is just some nasal drainage, dripping & we discussed trial Atrovent nasal spray for this... She had a UTI 7/12 & was treated, then saw Urology & they found postmenopausal atrophic vaginitis w/ topical estrogen cream Rx & this has helped she reports...  States breathing is OK, BP well controlled on meds & she still sees [place] for Cards> she reports doing well, seen 82mo ago, no ch in meds...  ~  January 21, 2011:  440mo ROV & she had one interval bronchitic infection, treated w/ Augmentin & improved towards baseline but notes that her legs are giving her trouble, weaker, etc; she desperately needs more PT/OT but she refuses!!!  She has macular degen & is legally blind, dependent on her son to provide help at home;  She notes that 2 y/o sister passed away recently (colon ca) leaving her as the only one of 8 sibs left alive...  AB> she had AB exac 10/12 & saw TP w/ Augmentin Rx & improved towards her baseline; remains on Advair250, NEB w/ Albut, Pred5mg /d...  HBP> on Lisinopril5, Lasix40, K20; BP= 126/72 & she denies CP, palpit, ch in SOB, edema, etc...  Cardia> AS & CHF> followed by [id number], meds reviewed, we don't have notes from him...  Hyperlipid> states she is off the Simva40 ?on her own or ?stopped by [place]; she did not bring med list or bottles to review; asked to have Cards send notes to me...  HH, GERD, Divertics> on Prilosec20 & Metaclop10qhs; sister died w/ colon ca- pts last colonoscopy 6/06 by [facility] w/o polyps (+divertics & ischemic colitis)...  DJD, Gout, LBP> on Uloric40 & Tramadol50 prn; she has severe deformities & difficulty getting up & about; she desperately needs more PT but she refuses.Marland KitchenMarland Kitchen  Anxiety> she takes [id number] but declines anxiolytic rx... LABS 12/12:  Chems- wnl x BS=132;  CBC- wnl w/ Hg=13.1;  Iron- sl low at 35 (14%sat);  TSH= 0.87  ~  May 27, 2011:  48mo ROV & [name] fell in her bathroom several weeks ago- hit the back of her head & left arm abrasion; EMS checked her & dressed her wound and she decided to see her local physician DrWElkins; he redressed the left arm abrasion & reassured her;  We rechecked her occipital area- sm scab ok to wash etc, and left arm abrasion healing nicely; She knows to be more careful to avoid falls etc... We reviewed her meds, problem list, & prev labs> see below>>    She tells me she saw [facility] for Cards f/u about a month ago>  stable, no changes made (we don't get notes from him)...  ~  October 07, 2011:  48mo ROV   She saw Urology 7/13 for Estring change> this has been controlling her dysuria & is done every 19mo...  She is followed by Susann Givens for Ortho> L1 compression fx ~4/13; on Tramadol & Miacalcin NS, Vit D, & Calcium...   We reviewed prob list, meds, xrays and labs> see below>> She requests refill of Nizoral shampoo- ok... LABS 9/13:  FLP- not at goals on Simva40, reminded to take everyday;  Chems- wnl;  CBC- wnl    Problem List:    << PROBLEM LIST UPDATED 05/27/11 >>  MACULAR DEGENERATION (ICD-362.50) - she is legally blind & son helps out at home...  SINUSITIS, CHRONIC (ICD-473.9) - she is s/p bliat [facility] 1986 by DrKraus... ~  CT Sinus 11/09 showed chr sinusitis changes in the max & sphenoid regions... ~  8/12: she is c/o drippy nose, trial Atrovent nasal spray...  ASTHMATIC BRONCHITIS, ACUTE (ICD-466.0) - on PRED 5mg /d, ADVAIR 250Bid, & ALBUTEROL NEBS Tid... stable- min cough, w/o sputum, no change in DOE, etc... ~  PFT's 3/06 showed FVC= 1.49 (68%), FEV1= 1.12 (75%), %1sec=75, mid-flows= 43%pred... ~  [facility] 8/09 w/ bilat pneumonia... see DC Summary. ~  CXR 10/09 showed stable cardiomeg, tort Ao, clear lungs, bilat shoulder arthroplasties... ~  CT Angio Chest 11/09 showed neg for PE, fluid/ mucus plugs in depend LL bronchi... ~  CXR 10/10 showed stable cardiomeg, bilat humeral head prostheses, NAD.Marland Kitchen. ~  CXR 1/12 showed COPD, chr changes, ?right basilar opacity ==> subsequent films w/ incr edema. ~  CXR 5/12 showed cardiomeg, sm right effusion, no edema, bilat shoulder arthroplasties, NAD... ~  10/12:  AB exac treated by TP w/ Augmentin & improved...  HYPERTENSION (ICD-401.9) - on LISINOPRIL 5mg /d, FUROSEMIDE 40mg /d & K20/d...   ~  8/12: her BP's have stabilized at home and measures 112/68 here today... she denies CP, palpit, syncope, ch in edema... ~  12/12: BP= 126/72 & clinically  stable w/o CP, palpit, ch in SOB, etc... ~  4/13:  BP= 108/62 & usually sl better at home; still denies any CP, palpit, ch in SOB, etc...  AORTIC STENOSIS (ICD-424.1)   << followed by [id number] for Cards >> Hx of CHF (ICD-428.0)  ~  2DEcho 3/06 showed mild AS/AI w/ mod incr AoV thickness & reduced leaflet excursion, mild MR, norm EF=55-65%...  ~  2DEcho 12/08 showed mod inc AoV thickness, mod reduced AV leaflet excursion, c/w mod AS, mild AI & mean grad= ;  norm LVF w/ EF= 65% and no wall motion abn etc... ~  2DEcho 1/12 in [place] showed severeAS & mildAI,  mild conc LVH, norm sys func w/ EF=60-65%, HK at apex, grI DD... ~  EKG 1/12 showed NSR, poor R progression, NSSTTWA... ~  We do not have follow up notes from [facility], pt indicates no change in meds...  PERIPHERAL VASCULAR DISEASE (ICD-443.9) - on ASA 81mg /d... she can't walk enough to elicit discomfort, no rest pain or lesions... ~  ABI's 12/01 showed .88 on R, and .95 on L... ~  MRI showed small vessel dis & small infarct noted...  VENOUS INSUFFICIENCY (ICD-459.81) - on low sodium diet, Lasix, KCl...  HYPERLIPIDEMIA (ICD-272.4) - prev on Zocor 40mg  per JYNWGNFAO- off now;  (prev on Pravachol, but stated intol to other Statins)... takes Fish Oil ~2000mg /d rec by her ophthalmologist... ~  FLP 9/05 ?on Prav40 showed TChol 176, TG 190, HDL 55, LDL 83... ~  FLP 4/10 on diet alone showed TChol 211, TG 153, HDL 44, LDL 128... she refuses med Rx- continue diet efforts. ~  1/12:  Hosp by [facility] & consulted DrKadakia who started her on ZOCOR40mg /d> he is checking her labs now... ~  12/12: she crossed Simva40 off her list ?stopped on her own or ?stopped by [place]... ~  2013:  She still has Simva40 on her list here but NEVER brings a list from [place] or her med bottles to review... ~  9/13:  She has agreed to try ATORVASTATIN 20mg /d...  HIATAL HERNIA (ICD-553.3) - on PRILOSEC 20mg /d & METACLOPRAMIDE 10mg Qhs... GERD (ICD-530.81) - EGD 3/06 by  DrStark showed 3cmHH, mild esophagitis...  DIVERTICULOSIS, COLON (ICD-562.10) - last colonoscopy was 6/06 by [facility] showed divertics and ischemic colitis...  ~  Adm 1/12 by [facility] w/ Diverticulitis, treated & improved, she has not followed up w/ GI; she take 1/2 capful of MIRALAX daily.  STRESS INCONTINENCE (ICD-788.39) - she has hx UTI's & dysuria on & off... ~  10/10: urine c/s +EColi & Rx'd w/ Cipro. ~  7/12: she reports vaginal estrogen per Urology has helped her urinary symptoms; note from DrOttelin reviewed. ~  Labs 12/12 w/ normal renal function & Creat=0.7  DEGENERATIVE JOINT DISEASE (ICD-715.90) - she has severe DJD & Gout w/ deformities- Foot XRay showed Charcot foot deformity, severe degenerative dis, etc...  added TRAMADOL + TYLENOL Prn... GOUT, UNSPECIFIED (ICD-274.9) - labs 4/10 showed Uric Acid level= 7.9.Marland KitchenMarland Kitchen she has hx of gout & hyperuricemia but allergic to Allopurinol w/ rash... therefore on ULORIC 40mg /d... Uric Acid level 5/11 on Uloric40 = 3.2 LOW BACK PAIN (ICD-724.2) NECK PAIN (ICD-723.1) - eval by Susann Givens ==> DrLewitt 2010 w/ MRI & Rx  w/ Lidoderm, Tylenol, Lyrica- now on NEURONTIN... OSTEOPOROSIS (ICD-733.00) - prev on Fosamax (stopped 1/12 in hosp "my kidneys can't handle it"); on Ca++, Vits, & Vit D... ~  labs 4/10 showed Vit D level = 28... rec> start Vit D OTC 1000 u daily. ~  labs 5/11 showed Vit D level = 32... rec incr Vit D to 2000 u daily. ~  2013:  She fell w/ L1 compression & managed by Susann Givens w/ MIACALCIN NS & Tramadol... ~  9/13:  We discussed proceeding w/ BMD here ==> pending  ANXIETY (ICD-300.00) - off prev Alpraz Rx & takes AMITRIPTYLINE 25mg  Qhs... under stress w/ 20 y/o son had MI & stent in 2010.  Hx of ANEMIA (ICD-285.9) - hx anemia req 2U transfusion 6/06 hosp for ischemic colitis...  ~  Hg= 12 - 13 over 2008-9. ~  labs 8/09 hosp showed Hg= 14 ~  labs 4/10 showed Hg= 12.2 ~  labs 3/11 showed Hg= 12.5, MCV= 92, Fe= 22 (9%sat)... start FeSO4 Bid. ~   labs 5/11 showed = 13.0, MCV= 94, Fe= 57 (23%)... continue Fe supplement. ~  Labs 12/12 showed Hg= 13.1, Fe= 35, (14%sat)... rec to continue Fe+VitC Bid...  DERM>  She has skin cancer removed from the right side of her face by DrDanJones [phone])...   Past Surgical History  Procedure Date  . Lumbar laminectomy   . Shoulder surgery     bilateral    Outpatient Encounter Prescriptions as of 10/07/2011  Medication Sig Dispense Refill  . ADVAIR DISKUS 250-50 MCG/DOSE AEPB USE 1 PUFF TWICE DAILY.  60 each  11  . albuterol (PROVENTIL) (2.5 MG/3ML) 0.083% nebulizer solution Take 2.5 mg by nebulization 3 (three) times daily as needed.        Marland Kitchen amitriptyline (ELAVIL) 25 MG tablet TAKE ONE TABLET AT BEDTIME.  30 tablet  11  . aspirin 81 MG tablet Take 81 mg by mouth daily.        . Calcium Carbonate-Vitamin D (CALCIUM 600+D) 600-400 MG-UNIT per tablet Take 1 tablet by mouth daily.       . Cholecalciferol (VITAMIN D) 1000 UNITS capsule Take 1,000 Units by mouth daily.        Marland Kitchen conjugated estrogens (PREMARIN) vaginal cream daily.       . DELTASONE 5 MG tablet TAKE 1 OR 2 TABLETS DAILY.  60 each  5  . ferrous gluconate (FERGON) 325 MG tablet Take 325 mg by mouth daily with breakfast.       . furosemide (LASIX) 40 MG tablet Take 1 tablet (40 mg total) by mouth daily.  30 tablet  11  . ipratropium (ATROVENT) 0.06 % nasal spray Place 2 sprays into the nose 4 (four) times daily.  15 mL  11  . K-DUR 20 MEQ tablet TAKE 1 TABLET ONCE DAILY.  30 each  6  . lisinopril (PRINIVIL,ZESTRIL) 5 MG tablet TAKE 1 TABLET ONCE DAILY.  30 tablet  6  . LOTRISONE cream APPLY TO RASH TWICE DAILY.  15 g  6  . Multiple Vitamin (MULTIVITAMIN) capsule Take 1 capsule by mouth daily.        Marland Kitchen omeprazole (PRILOSEC) 20 MG capsule Take 20 mg by mouth daily.        . REGLAN 10 MG tablet TAKE ONE TABLET AT BEDTIME.  30 each  2  . simvastatin (ZOCOR) 40 MG tablet Take 40 mg by mouth at bedtime.        Marland Kitchen ULORIC 40 MG tablet TAKE 1  TABLET EACH DAY.  30 tablet  6  . ULTRAM 50 MG tablet TAKE ONE TABLET EVERY 6 HOURS AS NEEDED FOR PAIN (MAY TAKE WITH TYLENOL).  90 each  5    Allergies  Allergen Reactions  . Allopurinol     REACTION: rash  . Codeine   . Hydrocodone     REACTION: hallucinations  . Morphine     REACTION: hallucinations    Current Medications, Allergies, Past Medical History, Past Surgical History, Family History, and Social History were reviewed in [employer] record.    Review of Systems        See HPI - all other systems neg except as noted... The patient complains of dyspnea on exertion, peripheral edema, abdominal pain, severe indigestion/heartburn, muscle weakness, and difficulty walking.  The patient denies anorexia, fever, weight loss, weight gain, vision loss, decreased hearing, hoarseness, chest pain, syncope, prolonged cough, headaches,  hemoptysis, melena, hematochezia, hematuria, incontinence, suspicious skin lesions, transient blindness, depression, unusual weight change, abnormal bleeding, enlarged lymph nodes, and angioedema.     Objective:   Physical Exam    WD, Petite, sl overweight, 78 y/o WF in NAD... she is <5' tall, weight 140# GENERAL:  Alert & oriented; pleasant & cooperative... HEENT:  [place]/AT, EACs-clear, TMs-wnl, NOSE-clear, THROAT-clear & wnl... known macular degen per ophthal... NECK:  Supple w/ decrROM; no JVD; normal carotid impulses w/o bruits; no thyromegaly or nodules palpated; no lymphadenopathy. CHEST:  Decr BS bilat, Coarse BS, no wheezing or rhonchi... HEART:  Regular Rhythm;  gr 1-2/6 SEM without rubs or gallops detected... ABDOMEN:  Soft & nontender; normal bowel sounds; no organomegaly or masses palpated... EXT: +hand deformities, w/ tophi & mod arthritic changes & contractures... right knee arthritis & severe foot deformities as well. no varicose veins/+venous insuffic/ tr edema. NEURO:  CN's intact x reduced vision... diffusely weak,  without focal changes... +gait abn... DERM:  Abrasion over occiput & on left arm; +ecchymoses, thin skin, etc...  RADIOLOGY DATA:  Reviewed in the EPIC EMR & discussed w/ the patient...  LABORATORY DATA:  Reviewed in the EPIC EMR & discussed w/ the patient...   Assessment & Plan:    AB>  Breathing is stable & she has been able to avoid URIs etc; continue Pred, Advair, Nebs.  HBP & Diastolic CHF>  Controlled on meds- Lisinopril & Lasix; continue same; she will f/u w/ JYNWGNFAO as planned.  AS>  Followed by [id number] for her severe AS, not a surg candidate...  HYPERLIPID>  Off Simva40 now ?per Cards or on her own; she will check w/ DrKadakia==> we don't have notes, apparently off all meds, she agrees to try ATORVASTATIN20.  GI>  Hx HH, GERD, Divertics, Colitis>  On Prilosec, Reglan 10mg  Qhs (she does not want to stop this med), & Miralax as noted...  GU>  Dysuria improved w/ vaginal cream (no estring) & it is changed Q46mo...  DJD/ Gout/ Neck&Back Pain>  As noted- on Tramadol, Tylenol, Uloric; she is weak in the legs, hard to stand & walk, I'm afraid she will become immobile, unable to stand or walk making self care difficult but she is unperturbed by this & refuses my rec for more home PT...  Anxiety>  She has Amitriptyline 25Qhs, not on anxiolytic...   Patient's Medications  New Prescriptions   ATORVASTATIN (LIPITOR) 20 MG TABLET    Take 1 tablet (20 mg total) by mouth daily.   KETOCONAZOLE (NIZORAL) 2 % SHAMPOO    Use as directed  Previous Medications   ADVAIR DISKUS 250-50 MCG/DOSE AEPB    USE 1 PUFF TWICE DAILY.   ALBUTEROL (PROVENTIL) (2.5 MG/3ML) 0.083% NEBULIZER SOLUTION    Take 2.5 mg by nebulization 3 (three) times daily as needed.     AMITRIPTYLINE (ELAVIL) 25 MG TABLET    TAKE ONE TABLET AT BEDTIME.   ASPIRIN 81 MG TABLET    Take 81 mg by mouth daily.     CALCITONIN, SALMON, (MIACALCIN/FORTICAL) 200 UNIT/ACT NASAL SPRAY    Place 1 spray into the nose daily.   CALCIUM  CARBONATE-VITAMIN D (CALCIUM 600+D) 600-400 MG-UNIT PER TABLET    Take 1 tablet by mouth daily.    CHOLECALCIFEROL (VITAMIN D) 1000 UNITS CAPSULE    Take 1,000 Units by mouth daily.     CONJUGATED ESTROGENS (PREMARIN) VAGINAL CREAM    daily.    DELTASONE 5 MG TABLET    TAKE 1 OR  2 TABLETS DAILY.   FERROUS GLUCONATE (FERGON) 325 MG TABLET    Take 325 mg by mouth daily with breakfast.    FUROSEMIDE (LASIX) 40 MG TABLET    Take 1 tablet (40 mg total) by mouth daily.   IPRATROPIUM (ATROVENT) 0.06 % NASAL SPRAY    Place 2 sprays into the nose 4 (four) times daily.   K-DUR 20 MEQ TABLET    TAKE 1 TABLET ONCE DAILY.   LISINOPRIL (PRINIVIL,ZESTRIL) 5 MG TABLET    TAKE 1 TABLET ONCE DAILY.   LOTRISONE CREAM    APPLY TO RASH TWICE DAILY.   MULTIPLE VITAMIN (MULTIVITAMIN) CAPSULE    Take 1 capsule by mouth daily.     OMEPRAZOLE (PRILOSEC) 20 MG CAPSULE    Take 20 mg by mouth daily.     REGLAN 10 MG TABLET    TAKE ONE TABLET AT BEDTIME.   SIMVASTATIN (ZOCOR) 40 MG TABLET    Take 40 mg by mouth at bedtime.     ULORIC 40 MG TABLET    TAKE 1 TABLET EACH DAY.   ULTRAM 50 MG TABLET    TAKE ONE TABLET EVERY 6 HOURS AS NEEDED FOR PAIN (MAY TAKE WITH TYLENOL).  Modified Medications   No medications on file  Discontinued Medications   No medications on file

## 2011-10-11 ENCOUNTER — Encounter: Payer: Self-pay | Admitting: Pulmonary Disease

## 2011-10-21 ENCOUNTER — Other Ambulatory Visit: Payer: Self-pay | Admitting: Pulmonary Disease

## 2011-11-02 ENCOUNTER — Telehealth: Payer: Self-pay | Admitting: Pulmonary Disease

## 2011-11-02 MED ORDER — FLUTICASONE-SALMETEROL 250-50 MCG/DOSE IN AEPB: 1.0000 | INHALATION_SPRAY | Freq: Two times a day (BID) | RESPIRATORY_TRACT | Status: DC

## 2011-11-02 NOTE — Telephone Encounter (Signed)
Pt aware rx has been sent 

## 2011-11-18 ENCOUNTER — Ambulatory Visit (INDEPENDENT_AMBULATORY_CARE_PROVIDER_SITE_OTHER): Payer: Medicare Other

## 2011-11-18 DIAGNOSIS — Z23 Encounter for immunization: Secondary | ICD-10-CM

## 2011-11-19 DIAGNOSIS — Z23 Encounter for immunization: Secondary | ICD-10-CM

## 2011-12-07 ENCOUNTER — Other Ambulatory Visit: Payer: Self-pay | Admitting: Pulmonary Disease

## 2011-12-22 ENCOUNTER — Other Ambulatory Visit: Payer: Self-pay | Admitting: Pulmonary Disease

## 2012-01-12 ENCOUNTER — Other Ambulatory Visit: Payer: Self-pay | Admitting: Pulmonary Disease

## 2012-01-17 ENCOUNTER — Other Ambulatory Visit: Payer: Self-pay | Admitting: Pulmonary Disease

## 2012-01-18 ENCOUNTER — Ambulatory Visit (INDEPENDENT_AMBULATORY_CARE_PROVIDER_SITE_OTHER): Payer: Medicare Other | Admitting: Adult Health

## 2012-01-18 ENCOUNTER — Encounter: Payer: Self-pay | Admitting: Adult Health

## 2012-01-18 VITALS — BP 106/70 | HR 84 | Temp 99.3°F | Ht 59.0 in | Wt 133.8 lb

## 2012-01-18 DIAGNOSIS — J209 Acute bronchitis, unspecified: Secondary | ICD-10-CM

## 2012-01-18 MED ORDER — AMOXICILLIN-POT CLAVULANATE 875-125 MG PO TABS: 1.0000 | ORAL_TABLET | Freq: Two times a day (BID) | ORAL | Status: AC

## 2012-01-18 MED ORDER — PREDNISONE 5 MG PO TABS: ORAL_TABLET | ORAL | Status: DC

## 2012-01-18 NOTE — Patient Instructions (Addendum)
Augmentin 875mg  Twice daily  For 7 days -take with food  Increase Prednisone 10mg  daily x 1 week then back 5mg  daily  Mucinex DM Twice daily  As needed  Cough/congestion Fluids and rest  Please contact office for sooner follow up if symptoms do not improve or worsen or seek emergency care  follow up Dr. Kriste Basque  As planned and As needed

## 2012-01-18 NOTE — Progress Notes (Signed)
Subjective:    Patient ID: Andrea Rubio, female    DOB: [birth year], 78 y.o.   MRN: [id number]  HPI 78 y/o WF , never smoker.     ~01/18/2012 Acute OV  Complains of  prod cough with cream-colored mucus, hoarseness, tickle in back of throat for 1 week. Worse for last 4days. Denies fever, hemoptysis, edema, chest pain.  OTC not working.  Currently on Prednisone 5mg  daily  Coughing up thick yellow mucus. Lost voice initially. Very hoarse.           Problem List:  MACULAR DEGENERATION (ICD-362.50)  SINUSITIS, CHRONIC (ICD-473.9) - she is s/p bliat [facility] 1986 by DrKraus... ~  CT Sinus 11/09 showed chr sinusitis changes in the max & sphenoid regions... ~  8/12: she is c/o drippy nose, trial Atrovent nasal spray...  ASTHMATIC BRONCHITIS, ACUTE (ICD-466.0) - on PRED 5mg /d, ADVAIR 250Bid, & ALBUTEROL NEBS Tid... stable- min cough, w/o sputum, no change in DOE, etc... ~  PFT's 3/06 showed FVC= 1.49 (68%), FEV1= 1.12 (75%), %1sec=75, mid-flows= 43%pred... ~  [facility] 8/09 w/ bilat pneumonia... see DC Summary. ~  CXR 10/09 showed stable cardiomeg, tort Ao, clear lungs, bilat shoulder arthroplasties... ~  CT Angio Chest 11/09 showed neg for PE, fluid/ mucus plugs in depend LL bronchi... ~  CXR 10/10 showed stable cardiomeg, bilat humeral head prostheses, NAD.Marland Kitchen. ~  CXR 1/12 showed COPD, chr changes, ?right basilar opacity ==> subsequent films w/ incr edema. ~  CXR 5/12 showed cardiomeg, sm right effusion, no edema, bilat shoulder arthroplasties, NAD...  HYPERTENSION (ICD-401.9) - on LISINOPRIL 5mg /d, FUROSEMIDE 40mg /d & K20/d...  her BP's have stabilized at home and measures 112/68 here today... she denies CP, palpit, syncope, ch in edema...  AORTIC STENOSIS (ICD-424.1)   << followed by [id number] for Cards >> Hx of CHF (ICD-428.0)  ~  2DEcho 3/06 showed mild AS/AI w/ mod incr AoV thickness & reduced leaflet excursion, mild MR, norm EF=55-65%...  ~  2DEcho 12/08 showed mod inc AoV  thickness, mod reduced AV leaflet excursion, c/w mod AS, mild AI & mean grad= ;  norm LVF w/ EF= 65% and no wall motion abn etc... ~  2DEcho 1/12 in [place] showed severeAS & mildAI, mild conc LVH, norm sys func w/ EF=60-65%, HK at apex, grI DD... ~  EKG 1/12 showed NSR, poor R progression, NSSTTWA...  PERIPHERAL VASCULAR DISEASE (ICD-443.9) - on ASA 81mg /d... she can't walk enough to elicit discomfort, no rest pain or lesions... ~  ABI's 12/01 showed .88 on R, and .95 on L... ~  MRI showed small vessel dis & small infarct noted...  VENOUS INSUFFICIENCY (ICD-459.81) - on low sodium diet, Lasix, KCl...  HYPERLIPIDEMIA (ICD-272.4) - now on ZOCOR 40mg  per DrKadakia (prev on Pravachol, but stated intol to other Statins)... takes Fish Oil ~2000mg /d rec by her ophthalmologist... ~  FLP 9/05 ?on Prav40 showed TChol 176, TG 190, HDL 55, LDL 83... ~  FLP 4/10 on diet alone showed TChol 211, TG 153, HDL 44, LDL 128... she refuses med Rx- continue diet efforts. ~  1/12:  Hosp by [facility] & consulted DrKadakia who started her on ZOCOR40mg /d> he is checking her labs now...  HIATAL HERNIA (ICD-553.3) - on PRILOSEC 20mg /d & METACLOPRAMIDE 10mg Qhs... GERD (ICD-530.81) - EGD 3/06 by DrStark showed 3cmHH, mild esophagitis...  DIVERTICULOSIS, COLON (ICD-562.10) - last colonoscopy was 6/06 by [facility] showed divertics and ischemic colitis...  ~  Adm 1/12 by [facility] w/ Diverticulitis, treated & improved, she has not followed  up w/ GI; she take 1/2 capful of MIRALAX daily.  STRESS INCONTINENCE (ICD-788.39) - she has hx UTI's on & off... ~  10/10: urine c/s +EColi & Rx'd w/ Cipro.  DEGENERATIVE JOINT DISEASE (ICD-715.90) - she has severe DJD & Gout w/ deformities- Foot XRay showed Charcot foot deformity, severe degenerative dis, etc...  added TRAMADOL + TYLENOL Prn... GOUT, UNSPECIFIED (ICD-274.9) - labs 4/10 showed Uric Acid level= 7.9.Marland KitchenMarland Kitchen she has hx of gout & hyperuricemia but allergic to Allopurinol w/ rash... therefore on  ULORIC 40mg /d... Uric Acid level 5/11 on Uloric40 = 3.2 LOW BACK PAIN (ICD-724.2) NECK PAIN (ICD-723.1) - eval by Susann Givens ==> DrLewitt 2010 w/ MRI & Rx  w/ Lidoderm, Tylenol, Lyrica- now on NEURONTIN... OSTEOPOROSIS (ICD-733.00) - prev on Fosamax (stopped 1/12 in hosp "my kidneys can't handle it"); on Ca++, Vits, & Vit D... ~  labs 4/10 showed Vit D level = 28... rec> start Vit D OTC 1000 u daily. ~  labs 5/11 showed Vit D level = 32... rec incr Vit D to 2000 u daily.  ANXIETY (ICD-300.00) - off prev Alpraz Rx & takes AMITRIPTYLINE 25mg  [facility]... under stress w/ 16 y/o son had MI & stent in 2010.  Hx of ANEMIA (ICD-285.9) - hx anemia req 2U transfusion 6/06 hosp for ischemic colitis...  ~  Hg= 12 - 13 over 2008-9. ~  labs 8/09 hosp showed Hg= 14 ~  labs 4/10 showed Hg= 12.2 ~  labs 3/11 showed Hg= 12.5, MCV= 92, Fe= 22 (9%sat)... start FeSO4 Bid. ~  labs 5/11 showed = 13.0, MCV= 94, Fe= 57 (23%)... continue Fe supplement.  DERM>  She has skin cancer removed from the right side of her face by DrDanJones [phone])...   Past Surgical History  Procedure Date  . Lumbar laminectomy   . Shoulder surgery     bilateral    Outpatient Encounter Prescriptions as of 01/18/2012  Medication Sig Dispense Refill  . albuterol (PROVENTIL) (2.5 MG/3ML) 0.083% nebulizer solution Take 2.5 mg by nebulization 3 (three) times daily as needed.        Marland Kitchen amitriptyline (ELAVIL) 25 MG tablet TAKE ONE TABLET AT BEDTIME.  30 tablet  11  . aspirin 81 MG tablet Take 81 mg by mouth daily.        Marland Kitchen atorvastatin (LIPITOR) 20 MG tablet Take 1 tablet (20 mg total) by mouth daily.  30 tablet  PRN  . calcitonin, salmon, (MIACALCIN/FORTICAL) 200 UNIT/ACT nasal spray Place 1 spray into the nose daily.      . Calcium Carbonate-Vitamin D (CALCIUM 600+D) 600-400 MG-UNIT per tablet Take 1 tablet by mouth daily.       . Cholecalciferol (VITAMIN D) 1000 UNITS capsule Take 1,000 Units by mouth daily.        Marland Kitchen conjugated estrogens  (PREMARIN) vaginal cream daily.       . DELTASONE 5 MG tablet TAKE 1 OR 2 TABLETS DAILY.  60 each  5  . ferrous gluconate (FERGON) 325 MG tablet Take 325 mg by mouth daily with breakfast.       . Fluticasone-Salmeterol (ADVAIR DISKUS) 250-50 MCG/DOSE AEPB Inhale 1 puff into the lungs 2 (two) times daily.  60 each  11  . furosemide (LASIX) 40 MG tablet Take 1 tablet (40 mg total) by mouth daily.  30 tablet  11  . ipratropium (ATROVENT) 0.06 % nasal spray USE 2 SPRAYS EACH NOSTRIL 4 TIMES A DAY.  15 mL  6  . K-DUR 20 MEQ tablet TAKE  1 TABLET ONCE DAILY.  30 each  6  . ketoconazole (NIZORAL) 2 % shampoo Use as directed  120 mL  PRN  . lisinopril (PRINIVIL,ZESTRIL) 5 MG tablet TAKE 1 TABLET ONCE DAILY.  30 tablet  6  . LOTRISONE cream APPLY TO RASH TWICE DAILY.  15 g  6  . metoCLOPramide (REGLAN) 10 MG tablet TAKE ONE TABLET AT BEDTIME.  30 tablet  5  . Multiple Vitamin (MULTIVITAMIN) capsule Take 1 capsule by mouth daily.        Marland Kitchen omeprazole (PRILOSEC) 20 MG capsule Take 20 mg by mouth daily.        . simvastatin (ZOCOR) 40 MG tablet Take 40 mg by mouth at bedtime.        Marland Kitchen ULORIC 40 MG tablet TAKE 1 TABLET EACH DAY.  30 tablet  6  . ULTRAM 50 MG tablet TAKE ONE TABLET EVERY 6 HOURS AS NEEDED FOR PAIN (MAY TAKE WITH TYLENOL).  90 each  5    Allergies  Allergen Reactions  . Allopurinol     REACTION: rash  . Codeine   . Hydrocodone     REACTION: hallucinations  . Morphine     REACTION: hallucinations    Review of Systems       Constitutional:   No  weight loss, night sweats,  Fevers, chills, fatigue, or  lassitude.  HEENT:   No headaches,  Difficulty swallowing,  Tooth/dental problems, or  Sore throat,                No sneezing, itching, ear ache,  +nasal congestion, post nasal drip,   CV:  No chest pain,  Orthopnea, PND, swelling in lower extremities, anasarca, dizziness, palpitations, syncope.   GI  No heartburn, indigestion, abdominal pain, nausea, vomiting, diarrhea, change in  bowel habits, loss of appetite, bloody stools.   Resp:   No coughing up of blood.    No chest wall deformity  Skin: no rash or lesions.  GU: no dysuria, change in color of urine, no urgency or frequency.  No flank pain, no hematuria   Psych:  No change in mood or affect. No depression or anxiety.  No memory loss.        Objective:   Physical Exam    78  y/o WF in NAD.  GENERAL:  Alert & oriented; pleasant & cooperative... HEENT:  [place]/AT, EACs-clear, TMs-wnl, NOSE-clear, THROAT-clear & wnl NECK:  Supple w/ decrROM; no JVD; normal carotid impulses w/o bruits; no thyromegaly or nodules palpated; no lymphadenopathy. CHEST:  Coarse BS with few rhonchi  HEART:  Regular Rhythm;  gr 1-2/6 SEM without rubs or gallops detected... ABDOMEN:  Soft & nontender; normal bowel sounds; no organomegaly or masses palpated... EXT: +hand deformities, w/ tophi & mod arthritic changes & contractures... right knee arthritis & severe foot deformities as well. no varicose veins/+venous insuffic/ tr edema. DERM:  no lesions- ecchymoses, thin skin, etc...   Assessment & Plan:

## 2012-01-18 NOTE — Assessment & Plan Note (Addendum)
Flare   Plan  Augmentin 875mg  Twice daily  For 7 days -take with food  Increase Prednisone 10mg  daily x 1 week then back 5mg  daily  Mucinex DM Twice daily  As needed  Cough/congestion Fluids and rest  Please contact office for sooner follow up if symptoms do not improve or worsen or seek emergency care

## 2012-01-18 NOTE — Addendum Note (Signed)
Addended by: Boone Master E on: 01/18/2012 04:54 PM   Modules accepted: Orders

## 2012-01-22 ENCOUNTER — Other Ambulatory Visit: Payer: Self-pay | Admitting: Pulmonary Disease

## 2012-02-01 ENCOUNTER — Other Ambulatory Visit: Payer: Self-pay | Admitting: Pulmonary Disease

## 2012-02-01 ENCOUNTER — Telehealth: Payer: Self-pay | Admitting: Adult Health

## 2012-02-01 MED ORDER — FIRST-DUKES MOUTHWASH MT SUSP: OROMUCOSAL | Status: DC

## 2012-02-01 MED ORDER — METHYLPREDNISOLONE 4 MG PO KIT: PACK | ORAL | Status: DC

## 2012-02-01 NOTE — Telephone Encounter (Signed)
Need to increase mucinex to 2 bid with fluids, need to hold prednisone, new rx for medrol dose pak, after dose pak complete go back to 5mg  maintenance dose of prednisone, add MMW 4oz 1 tsp qid swish and swallow, offer cough syrup if wants ok to use tussionex 4oz 1/2-1 tsp every 12 hrs prn cough sn aware of codeine allergy.

## 2012-02-01 NOTE — Telephone Encounter (Signed)
Per 12.16.13 ov w/ TP: Patient Instructions     Augmentin 875mg  Twice daily For 7 days -take with food  Increase Prednisone 10mg  daily x 1 week then back 5mg  daily  Mucinex DM Twice daily As needed Cough/congestion  Fluids and rest  Please contact office for sooner follow up if symptoms do not improve or worsen or seek emergency care  follow up Dr. Kriste Basque As planned and As needed    ======================================================= Called spoke with patient who stated that she is still having prod cough with light yellow/clear mucus, some sore throat.  Pt reports that she is greatly improved since ov w/ TP but the cough/congestion is still lingering.  Requesting something be called in for her.  TP not in office today.  Dr Kriste Basque please advise, thanks.  [place] Allergies  Allergen Reactions  . Allopurinol     REACTION: rash  . Codeine   . Hydrocodone     REACTION: hallucinations  . Morphine     REACTION: hallucinations

## 2012-02-01 NOTE — Telephone Encounter (Signed)
Called spoke with patient, advised of SN's recs as stated below. Pt okay with these recs and verbalized her understanding.  Pt declined a cough syrup at this time but will call back if she decides she would indeed like the tussionex.    Rx's sent to verified pharmacy.

## 2012-02-02 ENCOUNTER — Other Ambulatory Visit: Payer: Self-pay | Admitting: *Deleted

## 2012-02-02 MED ORDER — OMEPRAZOLE 20 MG PO CPDR: 20.0000 mg | DELAYED_RELEASE_CAPSULE | Freq: Every day | ORAL | Status: DC

## 2012-02-08 ENCOUNTER — Telehealth: Payer: Self-pay | Admitting: Pulmonary Disease

## 2012-02-08 MED ORDER — METHYLPREDNISOLONE 4 MG PO KIT: PACK | ORAL | Status: DC

## 2012-02-08 MED ORDER — MOXIFLOXACIN HCL 400 MG PO TABS: 400.0000 mg | ORAL_TABLET | Freq: Every day | ORAL | Status: DC

## 2012-02-08 NOTE — Telephone Encounter (Signed)
Called, spoke with pt's son, Chrissie Noa.  Advised him of below recs per Dr. Kriste Basque.  He verbalized understanding of this and is aware the medrol dosepak and avelox rxs have been sent to [facility].  Advised to call back if symptoms do not improve or worsen and seek emergency care if needed.  He is aware to have pt keep pending appt with Dr. Kriste Basque on Wednesday.

## 2012-02-08 NOTE — Telephone Encounter (Signed)
Called, spoke with pt's son, Andrea Rubio.  States pt went out to eat last night and since is having increased cough with green mucus, increased SOB at rest and with exertion, wheezing, and a little chest tightness.  Denies f/c/s or body aches.  Used neb this am with relief.  Used MMW and finished medrol dose pak yesterday.  Is now back on prednisone qd.  Requesting OV today.  Pt does have an appt with Dr. Kriste Basque on Wed, Jan 8.  As there are no openings in office today, Andrea Rubio is ok with rx being called in given pt's pending OV later this week.  Dr. Kriste Basque, pls advise.  Thank you.  Last OV with SN 10/07/11  Last OV with TP 01/18/12  [facility]  Allergies verified with Andrea Rubio:  Allergies  Allergen Reactions  . Allopurinol     REACTION: rash  . Codeine   . Hydrocodone     REACTION: hallucinations  . Morphine     REACTION: hallucinations

## 2012-02-08 NOTE — Telephone Encounter (Signed)
Per SN---call in medrol dosepak  #1  Take as directed, avelox 400 mg  #7  1 po daily, cont nebs, mucinex and increase fluids. thanks

## 2012-02-08 NOTE — Telephone Encounter (Signed)
Duplicate message.   Medrol and avelox have been sent in to the pharmacy per SN.  Nothing further is needed.

## 2012-02-10 ENCOUNTER — Encounter: Payer: Self-pay | Admitting: Pulmonary Disease

## 2012-02-10 ENCOUNTER — Ambulatory Visit (INDEPENDENT_AMBULATORY_CARE_PROVIDER_SITE_OTHER): Payer: Medicare PPO | Admitting: Pulmonary Disease

## 2012-02-10 VITALS — BP 100/64 | HR 88 | Temp 98.3°F | Ht 59.0 in | Wt 132.2 lb

## 2012-02-10 DIAGNOSIS — M109 Gout, unspecified: Secondary | ICD-10-CM

## 2012-02-10 DIAGNOSIS — F411 Generalized anxiety disorder: Secondary | ICD-10-CM

## 2012-02-10 DIAGNOSIS — I872 Venous insufficiency (chronic) (peripheral): Secondary | ICD-10-CM

## 2012-02-10 DIAGNOSIS — K219 Gastro-esophageal reflux disease without esophagitis: Secondary | ICD-10-CM

## 2012-02-10 DIAGNOSIS — M545 Low back pain: Secondary | ICD-10-CM

## 2012-02-10 DIAGNOSIS — M1990 Unspecified osteoarthritis, unspecified site: Secondary | ICD-10-CM

## 2012-02-10 DIAGNOSIS — K5730 Diverticulosis of large intestine without perforation or abscess without bleeding: Secondary | ICD-10-CM

## 2012-02-10 DIAGNOSIS — I359 Nonrheumatic aortic valve disorder, unspecified: Secondary | ICD-10-CM

## 2012-02-10 DIAGNOSIS — M810 Age-related osteoporosis without current pathological fracture: Secondary | ICD-10-CM

## 2012-02-10 DIAGNOSIS — J209 Acute bronchitis, unspecified: Secondary | ICD-10-CM

## 2012-02-10 DIAGNOSIS — I10 Essential (primary) hypertension: Secondary | ICD-10-CM

## 2012-02-10 DIAGNOSIS — K449 Diaphragmatic hernia without obstruction or gangrene: Secondary | ICD-10-CM

## 2012-02-10 DIAGNOSIS — I739 Peripheral vascular disease, unspecified: Secondary | ICD-10-CM

## 2012-02-10 DIAGNOSIS — E785 Hyperlipidemia, unspecified: Secondary | ICD-10-CM

## 2012-02-10 DIAGNOSIS — N39498 Other specified urinary incontinence: Secondary | ICD-10-CM

## 2012-02-10 DIAGNOSIS — M542 Cervicalgia: Secondary | ICD-10-CM

## 2012-02-10 NOTE — Progress Notes (Signed)
Subjective:    Patient ID: Andrea Rubio, female    DOB: [birth year], 79 y.o.   MRN: [id number]  HPI 79 y/o WF here for a follow up visit... she has multiple medical problems as noted below...   ~  February 19, 2010:  4 mo ROV- c/o incr urination on the Torsemide 20mg /d so she cut it back on her own to 1/2 tab daily but notes incr swelling & BNP= 1445> we discussed need to incr back to 20mg /d... also c/o "hard feeling" in abd- denies n/v, d/c, or blood & we discussed need for further eval w/ XRay Abd & poss CT vs eval by GI if nec (XRay shows stool throughout colon- needs lax Rx first w/ Miralax & Senakot-S daily)... Labs show Hg= 13.0, MCV= 96, but Fe= 13 (TIBC=140, Sat=7%) & we will refer to GI for further eval...  ~  Jun 25, 2010:  440mo ROV & post hosp check> she was [name] x2 in Jan2012 [facility] w/ Diverticulitis, Dehydration (renal insuffic, incr K+, UTI), severe AS & DiastolicCHF>  They consulted DrKadakia for Cards & she has been to his office "he said everything was fine"...  She went to a NH for rehab x5440mo & has been home x682mo now> ambulating w/ walker & having difficulty w/ legs giving out- getting home therapy thru [place]... [facility] records reviewed in detail; we don't have notes from [place]... She requests a new rolling walker- OK...  CXR today shows cardiomeg, sm right effusion, no edema, bilat shoulder arthroplasties, NAD... Labs look pretty good> see below:  ~  September 24, 2010:  382mo ROV & she is stable, holding her own & doing ok at home w/ her son there to help> in fact her CC today is just some nasal drainage, dripping & we discussed trial Atrovent nasal spray for this... She had a UTI 7/12 & was treated, then saw Urology & they found postmenopausal atrophic vaginitis w/ topical estrogen cream Rx & this has helped she reports...  States breathing is OK, BP well controlled on meds & she still sees [place] for Cards> she reports doing well, seen 82mo ago, no ch in meds...  ~  January 21, 2011:  440mo ROV & she had one interval bronchitic infection, treated w/ Augmentin & improved towards baseline but notes that her legs are giving her trouble, weaker, etc; she desperately needs more PT/OT but she refuses!!!  She has macular degen & is legally blind, dependent on her son to provide help at home;  She notes that 2 y/o sister passed away recently (colon ca) leaving her as the only one of 8 sibs left alive...  AB> she had AB exac 10/12 & saw TP w/ Augmentin Rx & improved towards her baseline; remains on Advair250, NEB w/ Albut, Pred5mg /d...  HBP> on Lisinopril5, Lasix40, K20; BP= 126/72 & she denies CP, palpit, ch in SOB, edema, etc...  Cardia> AS & CHF> followed by [id number], meds reviewed, we don't have notes from him...  Hyperlipid> states she is off the Simva40 ?on her own or ?stopped by [place]; she did not bring med list or bottles to review; asked to have Cards send notes to me...  HH, GERD, Divertics> on Prilosec20 & Metaclop10qhs; sister died w/ colon ca- pts last colonoscopy 6/06 by [facility] w/o polyps (+divertics & ischemic colitis)...  DJD, Gout, LBP> on Uloric40 & Tramadol50 prn; she has severe deformities & difficulty getting up & about; she desperately needs more PT but she refuses.Marland KitchenMarland Kitchen  Anxiety> she takes [id number] but declines anxiolytic rx... LABS 12/12:  Chems- wnl x BS=132;  CBC- wnl w/ Hg=13.1;  Iron- sl low at 35 (14%sat);  TSH= 0.87  ~  May 27, 2011:  62mo ROV & Andrea Rubio fell in her bathroom several weeks ago- hit the back of her head & left arm abrasion; EMS checked her & dressed her wound and she decided to see her local physician DrWElkins; he redressed the left arm abrasion & reassured her;  We rechecked her occipital area- sm scab ok to wash etc, and left arm abrasion healing nicely; She knows to be more careful to avoid falls etc... We reviewed her meds, problem list, & prev labs> see below>>    She tells me she saw [facility] for Cards f/u about a month ago>  stable, no changes made (we don't get notes from him)...  ~  October 07, 2011:  62mo ROV     She saw Urology 7/13 for Estring change> this has been controlling her dysuria & is done every 47mo...    She is followed by Susann Givens for Ortho> L1 compression fx ~4/13; on Tramadol & Miacalcin NS, Vit D, & Calcium... We reviewed prob list, meds, xrays and labs> see below>> She requests refill of Nizoral shampoo- ok... LABS 9/13:  FLP- not at goals on Simva40, reminded to take everyday;  Chems- wnl;  CBC- wnl  ~  February 10, 2012:  62mo ROV & Andrea Rubio has had a refractory AB episode w/ persist cough & discolored mucus; now on Avelox & we reviewed the need to maximize her Rx w/ Pred 5mg /d, NEBS Tid, Advair250Bid, Mucinex-2Bid, Fluids, etc... BP controlled on Lisin + Lasix;  Cards followed by [id number];  She was started on Atorva20  But didn't bring her med bottles to review (as requested);     We reviewed prob list, meds, xrays and labs> see below for updates >>      Problem List:    MACULAR DEGENERATION (ICD-362.50) - she is legally blind & son helps out at home...  SINUSITIS, CHRONIC (ICD-473.9) - she is s/p bliat [facility] 1986 by DrKraus... ~  CT Sinus 11/09 showed chr sinusitis changes in the max & sphenoid regions... ~  8/12: she is c/o drippy nose, trial Atrovent nasal spray...  ASTHMATIC BRONCHITIS, ACUTE (ICD-466.0) - on PRED 5mg /d, ADVAIR 250Bid, & ALBUTEROL NEBS Tid... stable- min cough, w/o sputum, no change in DOE, etc... ~  PFT's 3/06 showed FVC= 1.49 (68%), FEV1= 1.12 (75%), %1sec=75, mid-flows= 43%pred... ~  [facility] 8/09 w/ bilat pneumonia... see DC Summary. ~  CXR 10/09 showed stable cardiomeg, tort Ao, clear lungs, bilat shoulder arthroplasties... ~  CT Angio Chest 11/09 showed neg for PE, fluid/ mucus plugs in depend LL bronchi... ~  CXR 10/10 showed stable cardiomeg, bilat humeral head prostheses, NAD.Marland Kitchen. ~  CXR 1/12 showed COPD, chr changes, ?right basilar opacity ==>  subsequent films w/ incr edema. ~  CXR 5/12 showed cardiomeg, sm right effusion, no edema, bilat shoulder arthroplasties, NAD... ~  10/12:  AB exac treated by TP w/ Augmentin & improved...  HYPERTENSION (ICD-401.9) - on LISINOPRIL 5mg /d, FUROSEMIDE 40mg /d & K20/d...   ~  8/12: her BP's have stabilized at home and measures 112/68 here today... she denies CP, palpit, syncope, ch in edema... ~  12/12: BP= 126/72 & clinically stable w/o CP, palpit, ch in SOB, etc... ~  4/13:  BP= 108/62 & usually sl better at home; still denies any CP, palpit, ch in  SOB, etc...  AORTIC STENOSIS (ICD-424.1)   << followed by [id number] for Cards >> Hx of CHF (ICD-428.0)  ~  2DEcho 3/06 showed mild AS/AI w/ mod incr AoV thickness & reduced leaflet excursion, mild MR, norm EF=55-65%...  ~  2DEcho 12/08 showed mod inc AoV thickness, mod reduced AV leaflet excursion, c/w mod AS, mild AI & mean grad= ;  norm LVF w/ EF= 65% and no wall motion abn etc... ~  2DEcho 1/12 in [place] showed severeAS & mildAI, mild conc LVH, norm sys func w/ EF=60-65%, HK at apex, grI DD... ~  EKG 1/12 showed NSR, poor R progression, NSSTTWA... ~  We do not have follow up notes from [facility], pt indicates no change in meds...  PERIPHERAL VASCULAR DISEASE (ICD-443.9) - on ASA 81mg /d... she can't walk enough to elicit discomfort, no rest pain or lesions... ~  ABI's 12/01 showed .88 on R, and .95 on L... ~  MRI showed small vessel dis & small infarct noted...  VENOUS INSUFFICIENCY (ICD-459.81) - on low sodium diet, Lasix, KCl...  HYPERLIPIDEMIA (ICD-272.4) - prev on Zocor 40mg  per WUJWJXBJY- off now;  (prev on Pravachol, but stated intol to other Statins)... takes Fish Oil ~2000mg /d rec by her ophthalmologist... ~  FLP 9/05 ?on Prav40 showed TChol 176, TG 190, HDL 55, LDL 83... ~  FLP 4/10 on diet alone showed TChol 211, TG 153, HDL 44, LDL 128... she refuses med Rx- continue diet efforts. ~  1/12:  Hosp by [facility] & consulted DrKadakia who  started her on ZOCOR40mg /d> he is checking her labs now... ~  12/12: she crossed Simva40 off her list ?stopped on her own or ?stopped by [place]... ~  2013:  She still has Simva40 on her list here but NEVER brings a list from [place] or her med bottles to review... ~  9/13:  FLP here showed TChol 206, TG 167, HDL 48, LDL 133... She has agreed to try ATORVASTATIN 20mg /d...  HIATAL HERNIA (ICD-553.3) - on PRILOSEC 20mg /d & METACLOPRAMIDE 10mg Qhs... GERD (ICD-530.81) - EGD 3/06 by DrStark showed 3cmHH, mild esophagitis...  DIVERTICULOSIS, COLON (ICD-562.10) - last colonoscopy was 6/06 by [facility] showed divertics and ischemic colitis...  ~  Adm 1/12 by [facility] w/ Diverticulitis, treated & improved, she has not followed up w/ GI; she take 1/2 capful of MIRALAX daily.  STRESS INCONTINENCE (ICD-788.39) - she has hx UTI's & dysuria on & off... ~  10/10: urine c/s +EColi & Rx'd w/ Cipro. ~  7/12: she reports vaginal estrogen per Urology has helped her urinary symptoms; note from DrOttelin reviewed. ~  Labs 12/12 w/ normal renal function & Creat=0.7 ~  1013: she had f/u DrOttelin> Hx trophic vaginitis, dysuria, & recurrent UTIs; rec topical esprogen=> estring (he replaced it that day); PVR=100; he rec ROV in 62mo for estring replacement.  DEGENERATIVE JOINT DISEASE (ICD-715.90) - she has severe DJD & Gout w/ deformities- Foot XRay showed Charcot foot deformity, severe degenerative dis, etc...  added TRAMADOL + TYLENOL Prn... GOUT, UNSPECIFIED (ICD-274.9) - labs 4/10 showed Uric Acid level= 7.9.Marland KitchenMarland Kitchen she has hx of gout & hyperuricemia but allergic to Allopurinol w/ rash... therefore on ULORIC 40mg /d... Uric Acid level 5/11 on Uloric40 = 3.2 LOW BACK PAIN (ICD-724.2) NECK PAIN (ICD-723.1) - eval by Susann Givens ==> DrLewitt 2010 w/ MRI & Rx  w/ Lidoderm, Tylenol, Lyrica- now on NEURONTIN... OSTEOPOROSIS (ICD-733.00) - prev on Fosamax (stopped 1/12 in hosp "my kidneys can't handle it"); on Ca++, Vits, & Vit D... ~  labs  4/10 showed Vit D level = 28... rec> start Vit D OTC 1000 u daily. ~  labs 5/11 showed Vit D level = 32... rec incr Vit D to 2000 u daily. ~  2013:  She fell w/ L1 compression & managed by Susann Givens w/ MIACALCIN NS & Tramadol... ~  9/13:  We discussed proceeding w/ BMD here ==> pending  ANXIETY (ICD-300.00) - off prev Alpraz Rx & takes AMITRIPTYLINE 25mg  Qhs... under stress w/ 90 y/o son had MI & stent in 2010.  Hx of ANEMIA (ICD-285.9) - hx anemia req 2U transfusion 6/06 hosp for ischemic colitis...  ~  Hg= 12 - 13 over 2008-9. ~  labs 8/09 hosp showed Hg= 14 ~  labs 4/10 showed Hg= 12.2 ~  labs 3/11 showed Hg= 12.5, MCV= 92, Fe= 22 (9%sat)... start FeSO4 Bid. ~  labs 5/11 showed = 13.0, MCV= 94, Fe= 57 (23%)... continue Fe supplement. ~  Labs 12/12 showed Hg= 13.1, Fe= 35, (14%sat)... rec to continue Fe+VitC Bid... ~  Labs 9/13 showed Hg= 13.7, MCV=94  DERM>  She has skin cancer removed from the right side of her face by DrDanJones (Spring2012)...   Past Surgical History  Procedure Date  . Lumbar laminectomy   . Shoulder surgery     bilateral    Outpatient Encounter Prescriptions as of 02/10/2012  Medication Sig Dispense Refill  . albuterol (PROVENTIL) (2.5 MG/3ML) 0.083% nebulizer solution Take 2.5 mg by nebulization 3 (three) times daily as needed.        Marland Kitchen amitriptyline (ELAVIL) 25 MG tablet TAKE ONE TABLET AT BEDTIME.  30 tablet  6  . aspirin 81 MG tablet Take 81 mg by mouth daily.        Marland Kitchen atorvastatin (LIPITOR) 20 MG tablet Take 1 tablet (20 mg total) by mouth daily.  30 tablet  PRN  . calcitonin, salmon, (MIACALCIN/FORTICAL) 200 UNIT/ACT nasal spray Place 1 spray into the nose daily.      . Calcium Carbonate-Vitamin D (CALCIUM 600+D) 600-400 MG-UNIT per tablet Take 1 tablet by mouth daily.       . Cholecalciferol (VITAMIN D) 1000 UNITS capsule Take 1,000 Units by mouth daily.        Marland Kitchen conjugated estrogens (PREMARIN) vaginal cream daily.       . Diphenhyd-Hydrocort-Nystatin  (FIRST-DUKES MOUTHWASH) SUSP Gargle and swallow 1 tsp four times daily  120 mL  0  . ferrous gluconate (FERGON) 325 MG tablet Take 325 mg by mouth daily with breakfast.       . Fluticasone-Salmeterol (ADVAIR DISKUS) 250-50 MCG/DOSE AEPB Inhale 1 puff into the lungs 2 (two) times daily.  60 each  11  . furosemide (LASIX) 40 MG tablet Take 1 tablet (40 mg total) by mouth daily.  30 tablet  11  . ipratropium (ATROVENT) 0.06 % nasal spray USE 2 SPRAYS EACH NOSTRIL 4 TIMES A DAY.  15 mL  6  . K-DUR 20 MEQ tablet TAKE 1 TABLET ONCE DAILY.  30 each  6  . ketoconazole (NIZORAL) 2 % shampoo Use as directed  120 mL  PRN  . lisinopril (PRINIVIL,ZESTRIL) 5 MG tablet TAKE 1 TABLET ONCE DAILY.  30 tablet  6  . LOTRISONE cream APPLY TO RASH TWICE DAILY.  15 g  6  . metoCLOPramide (REGLAN) 10 MG tablet TAKE ONE TABLET AT BEDTIME.  30 tablet  5  . moxifloxacin (AVELOX) 400 MG tablet Take 1 tablet (400 mg total) by mouth daily.  7 tablet  0  .  Multiple Vitamin (MULTIVITAMIN) capsule Take 1 capsule by mouth daily.        . predniSONE (DELTASONE) 5 MG tablet Take 1-2 tabs by mouth once daily  60 tablet  5  . PRILOSEC OTC 20 MG tablet TAKE 1 TABLET ONCE DAILY.  42 tablet  6  . simvastatin (ZOCOR) 40 MG tablet Take 40 mg by mouth at bedtime.        Marland Kitchen ULORIC 40 MG tablet TAKE 1 TABLET EACH DAY.  30 tablet  6  . ULTRAM 50 MG tablet TAKE ONE TABLET EVERY 6 HOURS AS NEEDED FOR PAIN (MAY TAKE WITH TYLENOL).  90 each  5  . [DISCONTINUED] methylPREDNISolone (MEDROL DOSEPAK) 4 MG tablet follow package directions  21 tablet  0  . [DISCONTINUED] methylPREDNISolone (MEDROL DOSEPAK) 4 MG tablet follow package directions  21 tablet  0  . [DISCONTINUED] omeprazole (PRILOSEC) 20 MG capsule Take 1 capsule (20 mg total) by mouth daily.  42 capsule  3    Allergies  Allergen Reactions  . Allopurinol     REACTION: rash  . Codeine   . Hydrocodone     REACTION: hallucinations  . Morphine     REACTION: hallucinations    Current  Medications, Allergies, Past Medical History, Past Surgical History, Family History, and Social History were reviewed in [employer] record.    Review of Systems        See HPI - all other systems neg except as noted... The patient complains of dyspnea on exertion, peripheral edema, abdominal pain, severe indigestion/heartburn, muscle weakness, and difficulty walking.  The patient denies anorexia, fever, weight loss, weight gain, vision loss, decreased hearing, hoarseness, chest pain, syncope, prolonged cough, headaches, hemoptysis, melena, hematochezia, hematuria, incontinence, suspicious skin lesions, transient blindness, depression, unusual weight change, abnormal bleeding, enlarged lymph nodes, and angioedema.     Objective:   Physical Exam    WD, Petite, sl overweight, 79 y/o WF in NAD... she is <5' tall, weight 140# GENERAL:  Alert & oriented; pleasant & cooperative... HEENT:  [place]/AT, EACs-clear, TMs-wnl, NOSE-clear, THROAT-clear & wnl... known macular degen per ophthal... NECK:  Supple w/ decrROM; no JVD; normal carotid impulses w/o bruits; no thyromegaly or nodules palpated; no lymphadenopathy. CHEST:  Decr BS bilat, Coarse BS, no wheezing or rhonchi... HEART:  Regular Rhythm;  gr 1-2/6 SEM without rubs or gallops detected... ABDOMEN:  Soft & nontender; normal bowel sounds; no organomegaly or masses palpated... EXT: +hand deformities, w/ tophi & mod arthritic changes & contractures... right knee arthritis & severe foot deformities as well. no varicose veins/+venous insuffic/ tr edema. NEURO:  CN's intact x reduced vision... diffusely weak, without focal changes... +gait abn... DERM:  Abrasion over occiput & on left arm; +ecchymoses, thin skin, etc...  RADIOLOGY DATA:  Reviewed in the EPIC EMR & discussed w/ the patient...  LABORATORY DATA:  Reviewed in the EPIC EMR & discussed w/ the patient...   Assessment & Plan:    AB>  We discussed the need to avoid  infections & maximize her home resp treatments...  HBP & Diastolic CHF>  Controlled on meds- Lisinopril & Lasix; continue same; she will f/u w/ [id number] as planned.  AS>  Followed by [id number] for her severe AS, not a surg candidate...  HYPERLIPID>  Off Simva40 now ?per Cards or on her own; she will check w/ DrKadakia==> we don't have notes, apparently off all meds, she agrees to try ATORVASTATIN20.  GI>  Hx HH, GERD, Divertics, Colitis>  On Prilosec, Reglan 10mg  Qhs (she does not want to stop this med), & Miralax as noted...  GU>  Dysuria improved w/ vaginal cream (no estring) & it is changed Q39mo...  DJD/ Gout/ Neck&Back Pain>  As noted- on Tramadol, Tylenol, Uloric; she is weak in the legs, hard to stand & walk, I'm afraid she will become immobile, unable to stand or walk making self care difficult but she is unperturbed by this & refuses my rec for more home PT...  Anxiety>  She has Amitriptyline 25Qhs, not on anxiolytic...   Patient's Medications  New Prescriptions   AZELASTINE-FLUTICASONE 137-50 MCG/ACT SUSP    Place 1-2 sprays into the nose 2 (two) times daily as needed.   LEVOFLOXACIN (LEVAQUIN) 500 MG TABLET    Take 1 tablet (500 mg total) by mouth daily.   LOSARTAN (COZAAR) 25 MG TABLET    Take 1 tablet (25 mg total) by mouth daily.  Previous Medications   ALBUTEROL (PROVENTIL) (2.5 MG/3ML) 0.083% NEBULIZER SOLUTION    Take 2.5 mg by nebulization 3 (three) times daily as needed.     AMITRIPTYLINE (ELAVIL) 25 MG TABLET    TAKE ONE TABLET AT BEDTIME.   ASPIRIN 81 MG TABLET    Take 81 mg by mouth daily.     ATORVASTATIN (LIPITOR) 20 MG TABLET    Take 1 tablet (20 mg total) by mouth daily.   CALCITONIN, SALMON, (MIACALCIN/FORTICAL) 200 UNIT/ACT NASAL SPRAY    Place 1 spray into the nose daily.   CALCIUM CARBONATE-VITAMIN D (CALCIUM 600+D) 600-400 MG-UNIT PER TABLET    Take 1 tablet by mouth daily.    CHOLECALCIFEROL (VITAMIN D) 1000 UNITS CAPSULE    Take 1,000 Units by mouth  daily.     CONJUGATED ESTROGENS (PREMARIN) VAGINAL CREAM    daily.    FERROUS GLUCONATE (FERGON) 325 MG TABLET    Take 325 mg by mouth daily with breakfast.    FLUTICASONE-SALMETEROL (ADVAIR DISKUS) 250-50 MCG/DOSE AEPB    Inhale 1 puff into the lungs 2 (two) times daily.   FUROSEMIDE (LASIX) 40 MG TABLET    Take 1 tablet (40 mg total) by mouth daily.   IPRATROPIUM (ATROVENT) 0.06 % NASAL SPRAY    USE 2 SPRAYS EACH NOSTRIL 4 TIMES A DAY.   KETOCONAZOLE (NIZORAL) 2 % SHAMPOO    Use as directed   LISINOPRIL (PRINIVIL,ZESTRIL) 5 MG TABLET    TAKE 1 TABLET ONCE DAILY.   METOCLOPRAMIDE (REGLAN) 10 MG TABLET    TAKE ONE TABLET AT BEDTIME.   MULTIPLE VITAMIN (MULTIVITAMIN) CAPSULE    Take 1 capsule by mouth daily.     PREDNISONE (DELTASONE) 5 MG TABLET    Take 1-2 tabs by mouth once daily   PRILOSEC OTC 20 MG TABLET    TAKE 1 TABLET ONCE DAILY.   SIMVASTATIN (ZOCOR) 40 MG TABLET    Take 40 mg by mouth at bedtime.     ULORIC 40 MG TABLET    TAKE 1 TABLET EACH DAY.   ULTRAM 50 MG TABLET    TAKE ONE TABLET EVERY 6 HOURS AS NEEDED FOR PAIN (MAY TAKE WITH TYLENOL).  Modified Medications   Modified Medication Previous Medication   CLOTRIMAZOLE-BETAMETHASONE (LOTRISONE) CREAM LOTRISONE cream      APPLY TO RASH TWICE DAILY.    APPLY TO RASH TWICE DAILY.   POTASSIUM CHLORIDE SA (K-DUR,KLOR-CON) 20 MEQ TABLET K-DUR 20 MEQ tablet      TAKE 1 TABLET ONCE DAILY.    TAKE  1 TABLET ONCE DAILY.  Discontinued Medications   DIPHENHYD-HYDROCORT-NYSTATIN (FIRST-DUKES MOUTHWASH) SUSP    Gargle and swallow 1 tsp four times daily   METHYLPREDNISOLONE (MEDROL DOSEPAK) 4 MG TABLET    follow package directions   METHYLPREDNISOLONE (MEDROL DOSEPAK) 4 MG TABLET    follow package directions   MOXIFLOXACIN (AVELOX) 400 MG TABLET    Take 1 tablet (400 mg total) by mouth daily.   OMEPRAZOLE (PRILOSEC) 20 MG CAPSULE    Take 1 capsule (20 mg total) by mouth daily.

## 2012-02-10 NOTE — Patient Instructions (Addendum)
Today we updated your med list in our EPIC system...    Continue your current medications the same...  We need to maximize your Asthmatic Bronchitis regimen>>    Use the NEBULIZER 3 times daily...    Continue the ADVAIR twice daily...    Continue the PREDNISONE...    Doreatha Martin out the current antibiotics...    Add-in MUCINEX OTC- 600mg  tabs, take 2 tabs twice daily & lots of fluids all day long...    Work to cough up the phlegm & any mucous plugs...  We will arrange for a Bone Density Test...  Call for any questions or if we can be of service...  Let's plan a follow up visit recheck in 4 months, sooner if needed for any reason.Marland KitchenMarland Kitchen

## 2012-02-12 ENCOUNTER — Other Ambulatory Visit: Payer: Self-pay | Admitting: Pulmonary Disease

## 2012-02-16 ENCOUNTER — Telehealth: Payer: Self-pay | Admitting: Pulmonary Disease

## 2012-02-16 MED ORDER — AZELASTINE-FLUTICASONE 137-50 MCG/ACT NA SUSP: 1.0000 | Freq: Two times a day (BID) | NASAL | Status: DC | PRN

## 2012-02-16 NOTE — Telephone Encounter (Signed)
Per SN---cont the mucinex 2 po bid, add dymista nasal spray  1-2 sprays each nostril bid, tylenol  2 po tid as needed.  thanks

## 2012-02-16 NOTE — Telephone Encounter (Signed)
Pt advised of Dr Jodelle Green recommendations and that rx for Dymista was sent to [facility].

## 2012-02-16 NOTE — Telephone Encounter (Signed)
Pt finished AVelox 2 days ago.  PT still c/o feeling clammy and sweating.  Pt does not have thermometer to check temp.  Pt still having sinus drainage (? Color).  SOB about the same.  Prod cough (dark in color).  Pt wants to know if there is anything else she can do.  Please advise.

## 2012-02-23 ENCOUNTER — Telehealth: Payer: Self-pay | Admitting: Pulmonary Disease

## 2012-02-23 ENCOUNTER — Inpatient Hospital Stay: Admission: RE | Admit: 2012-02-23 | Payer: Medicare PPO | Source: Ambulatory Visit

## 2012-02-23 MED ORDER — LEVOFLOXACIN 500 MG PO TABS: 500.0000 mg | ORAL_TABLET | Freq: Every day | ORAL | Status: DC

## 2012-02-23 NOTE — Telephone Encounter (Signed)
I spoke with spouse. He stated pt running low grade fever of 99.8, cough w. Dark green phlem, chills, sweats, wheezing, chest congestion x Friday. Taking mucinex QD, tylenol and increased her fluids. Requesting to have something called in. Please advise thanks Last OV 02/10/12 Pending OV 06/08/12 Allergies  Allergen Reactions  . Allopurinol     REACTION: rash  . Codeine   . Hydrocodone     REACTION: hallucinations  . Morphine     REACTION: hallucinations

## 2012-02-23 NOTE — Telephone Encounter (Signed)
Per SN---ok to send in levaquin 500 mg  1 daily  #7 .  thanks

## 2012-02-23 NOTE — Telephone Encounter (Signed)
Spouse aware of SN recs. He voiced his understanding and needed nothing further. rx has been sent

## 2012-02-26 ENCOUNTER — Other Ambulatory Visit: Payer: Medicare PPO

## 2012-02-26 ENCOUNTER — Telehealth: Payer: Self-pay | Admitting: Pulmonary Disease

## 2012-02-26 NOTE — Telephone Encounter (Signed)
I spoke with pt Son. He stated pt is running fever of 104,0, nausea, vomited once this AM, cough w/ dark green phlem. No wheezing, no chest tx, no chest congestion, no chills, no sweats. She is still on levaquin 500 mg and has 3 days left, she took tylenol 2l at 7 AM, and is drinking plenty of fluids. He did not want to take pt out in this cold weather. Please advise SN thanks Last OV 02/10/12 Pending OV 06/08/12 Allergies  Allergen Reactions  . Allopurinol     REACTION: rash  . Codeine   . Hydrocodone     REACTION: hallucinations  . Morphine     REACTION: hallucinations

## 2012-02-26 NOTE — Telephone Encounter (Signed)
Per SN---if her temp is 104--she will need to go to the ER If her temp is 100.4 then cont the levaquin, mucinex, fluids, tylenol.  thanks

## 2012-02-26 NOTE — Telephone Encounter (Signed)
I spoke with son and advised him to take pt to the ED since pt temp was 104.0. He stated pt temp was going down since giving her the tylenol/ it was down to 100.7. He stated he is just going to keep a check on her and monitor her temp. He stated he did not want to take her out in the cold. Advised him again to take her to the ED but still refused. Will sign off message

## 2012-02-27 ENCOUNTER — Other Ambulatory Visit: Payer: Self-pay | Admitting: Pulmonary Disease

## 2012-02-29 ENCOUNTER — Ambulatory Visit (INDEPENDENT_AMBULATORY_CARE_PROVIDER_SITE_OTHER): Payer: Medicare PPO | Admitting: Adult Health

## 2012-02-29 ENCOUNTER — Ambulatory Visit (INDEPENDENT_AMBULATORY_CARE_PROVIDER_SITE_OTHER)
Admission: RE | Admit: 2012-02-29 | Discharge: 2012-02-29 | Disposition: A | Discharge status: Home / Self Care | Payer: Medicare PPO | Source: Ambulatory Visit | Attending: Adult Health | Admitting: Adult Health

## 2012-02-29 ENCOUNTER — Encounter: Payer: Self-pay | Admitting: Adult Health

## 2012-02-29 VITALS — BP 118/64 | HR 81 | Temp 98.2°F | Ht 59.0 in | Wt 135.0 lb

## 2012-02-29 DIAGNOSIS — J209 Acute bronchitis, unspecified: Secondary | ICD-10-CM

## 2012-02-29 MED ORDER — LOSARTAN POTASSIUM 25 MG PO TABS: 25.0000 mg | ORAL_TABLET | Freq: Every day | ORAL | Status: DC

## 2012-02-29 NOTE — Assessment & Plan Note (Signed)
Recurrent /Slow to resolve AEAB requiring several abx and steroid bursts.  Will check xray today  Change ACE to ARB to see if this helps to avoid recurrent cough/wheezing   Plan  Finish Levaquin as directed.  Continue on  Prednisone 10mg  daily x 1 week then back 5mg  daily  Mucinex DM Twice daily  As needed  Cough/congestion Fluids and rest  I will call with xray as planned .  Stop Lisinopril .this may me causing your cough to be worse.  Begin Cozaar 25mg  daily . Please contact office for sooner follow up if symptoms do not improve or worsen or seek emergency care  follow up Dr. Kriste Basque  As planned and As needed

## 2012-02-29 NOTE — Patient Instructions (Addendum)
Finish Levaquin as directed.  Continue on  Prednisone 10mg  daily x 1 week then back 5mg  daily  Mucinex DM Twice daily  As needed  Cough/congestion Fluids and rest  I will call with xray as planned .  Stop Lisinopril .this may me causing your cough to be worse.  Begin Cozaar 25mg  daily . Please contact office for sooner follow up if symptoms do not improve or worsen or seek emergency care  follow up Dr. Kriste Basque  As planned and As needed

## 2012-02-29 NOTE — Progress Notes (Signed)
Subjective:    Patient ID: Andrea Rubio, female    DOB: Jun 27, 1925, 79 y.o.   MRN: [id number]  HPI 79 y/o WF , never smoker.    ~02/29/2012 Acute OV  Complains of persistent cough .  Has had several flare of bronchitis over last 6 weeks .  Was seen 12/16 for bronchitis , tx w/ augmentin . Then 02/08/12 tx w/ Avelox and medrol dose pack for bronchitis . Called Levaquin 1/21 for bronchitis , today is last day of Levaquin.  Continues to have bronchitis > still having cold sweats, chills, prod cough with dark mucus, increased SOB, wheezing, some tightness.  took last levaquin this morning. Did have some fevers -low grade . Vomited x 1 after taking her abx 4 days ago. No further n/v.  Good appetite. No weight loss. No increased edema.  Increased prednisone 20mg  daily x 3 days.  [birth year].           Problem List:  MACULAR DEGENERATION (ICD-362.50)  SINUSITIS, CHRONIC (ICD-473.9) - she is s/p bliat [facility] 1986 by DrKraus... ~  CT Sinus 11/09 showed chr sinusitis changes in the max & sphenoid regions... ~  8/12: she is c/o drippy nose, trial Atrovent nasal spray...  ASTHMATIC BRONCHITIS, ACUTE (ICD-466.0) - on PRED 5mg /d, ADVAIR 250Bid, & ALBUTEROL NEBS Tid... stable- min cough, w/o sputum, no change in DOE, etc... ~  PFT's 3/06 showed FVC= 1.49 (68%), FEV1= 1.12 (75%), %1sec=75, mid-flows= 43%pred... ~  [facility] 8/09 w/ bilat pneumonia... see DC Summary. ~  CXR 10/09 showed stable cardiomeg, tort Ao, clear lungs, bilat shoulder arthroplasties... ~  CT Angio Chest 11/09 showed neg for PE, fluid/ mucus plugs in depend LL bronchi... ~  CXR 10/10 showed stable cardiomeg, bilat humeral head prostheses, NAD.Marland Kitchen. ~  CXR 1/12 showed COPD, chr changes, ?right basilar opacity ==> subsequent films w/ incr edema. ~  CXR 5/12 showed cardiomeg, sm right effusion, no edema, bilat shoulder arthroplasties, NAD...  HYPERTENSION (ICD-401.9) - on LISINOPRIL 5mg /d, FUROSEMIDE 40mg /d &  K20/d...  her BP's have stabilized at home and measures 112/68 here today... she denies CP, palpit, syncope, ch in edema...  AORTIC STENOSIS (ICD-424.1)   << followed by [id number] for Cards >> Hx of CHF (ICD-428.0)  ~  2DEcho 3/06 showed mild AS/AI w/ mod incr AoV thickness & reduced leaflet excursion, mild MR, norm EF=55-65%...  ~  2DEcho 12/08 showed mod inc AoV thickness, mod reduced AV leaflet excursion, c/w mod AS, mild AI & mean grad= ;  norm LVF w/ EF= 65% and no wall motion abn etc... ~  2DEcho 1/12 in [place] showed severeAS & mildAI, mild conc LVH, norm sys func w/ EF=60-65%, HK at apex, grI DD... ~  EKG 1/12 showed NSR, poor R progression, NSSTTWA...  PERIPHERAL VASCULAR DISEASE (ICD-443.9) - on ASA 81mg /d... she can't walk enough to elicit discomfort, no rest pain or lesions... ~  ABI's 12/01 showed .88 on R, and .95 on L... ~  MRI showed small vessel dis & small infarct noted...  VENOUS INSUFFICIENCY (ICD-459.81) - on low sodium diet, Lasix, KCl...  HYPERLIPIDEMIA (ICD-272.4) - now on ZOCOR 40mg  per DrKadakia (prev on Pravachol, but stated intol to other Statins)... takes Fish Oil ~2000mg /d rec by her ophthalmologist... ~  FLP 9/05 ?on Prav40 showed TChol 176, TG 190, HDL 55, LDL 83... ~  FLP 4/10 on diet alone showed TChol 211, TG 153, HDL 44, LDL 128... she refuses med Rx- continue diet efforts. ~  1/12:  Hosp by [facility] & consulted DrKadakia who started her on ZOCOR40mg /d> he is checking her labs now...  HIATAL HERNIA (ICD-553.3) - on PRILOSEC 20mg /d & METACLOPRAMIDE 10mg Qhs... GERD (ICD-530.81) - EGD 3/06 by DrStark showed 3cmHH, mild esophagitis...  DIVERTICULOSIS, COLON (ICD-562.10) - last colonoscopy was 6/06 by [facility] showed divertics and ischemic colitis...  ~  Adm 1/12 by [facility] w/ Diverticulitis, treated & improved, she has not followed up w/ GI; she take 1/2 capful of MIRALAX daily.  STRESS INCONTINENCE (ICD-788.39) - she has hx UTI's on & off... ~  10/10: urine c/s  +EColi & Rx'd w/ Cipro.  DEGENERATIVE JOINT DISEASE (ICD-715.90) - she has severe DJD & Gout w/ deformities- Foot XRay showed Charcot foot deformity, severe degenerative dis, etc...  added TRAMADOL + TYLENOL Prn... GOUT, UNSPECIFIED (ICD-274.9) - labs 4/10 showed Uric Acid level= 7.9.Marland KitchenMarland Kitchen she has hx of gout & hyperuricemia but allergic to Allopurinol w/ rash... therefore on ULORIC 40mg /d... Uric Acid level 5/11 on Uloric40 = 3.2 LOW BACK PAIN (ICD-724.2) NECK PAIN (ICD-723.1) - eval by Susann Givens ==> DrLewitt 2010 w/ MRI & Rx  w/ Lidoderm, Tylenol, Lyrica- now on NEURONTIN... OSTEOPOROSIS (ICD-733.00) - prev on Fosamax (stopped 1/12 in hosp "my kidneys can't handle it"); on Ca++, Vits, & Vit D... ~  labs 4/10 showed Vit D level = 28... rec> start Vit D OTC 1000 u daily. ~  labs 5/11 showed Vit D level = 32... rec incr Vit D to 2000 u daily.  ANXIETY (ICD-300.00) - off prev Alpraz Rx & takes AMITRIPTYLINE 25mg  [facility]... under stress w/ 33 y/o son had MI & stent in 2010.  Hx of ANEMIA (ICD-285.9) - hx anemia req 2U transfusion 6/06 hosp for ischemic colitis...  ~  Hg= 12 - 13 over 2008-9. ~  labs 8/09 hosp showed Hg= 14 ~  labs 4/10 showed Hg= 12.2 ~  labs 3/11 showed Hg= 12.5, MCV= 92, Fe= 22 (9%sat)... start FeSO4 Bid. ~  labs 5/11 showed = 13.0, MCV= 94, Fe= 57 (23%)... continue Fe supplement.  DERM>  She has skin cancer removed from the right side of her face by DrDanJones [phone])...   Past Surgical History  Procedure Date  . Lumbar laminectomy   . Shoulder surgery     bilateral    Outpatient Encounter Prescriptions as of 02/29/2012  Medication Sig Dispense Refill  . albuterol (PROVENTIL) (2.5 MG/3ML) 0.083% nebulizer solution Take 2.5 mg by nebulization 3 (three) times daily as needed.        Marland Kitchen amitriptyline (ELAVIL) 25 MG tablet TAKE ONE TABLET AT BEDTIME.  30 tablet  6  . aspirin 81 MG tablet Take 81 mg by mouth daily.        . Azelastine-Fluticasone 137-50 MCG/ACT SUSP Place 1-2  sprays into the nose 2 (two) times daily as needed.  1 Bottle  5  . calcitonin, salmon, (MIACALCIN/FORTICAL) 200 UNIT/ACT nasal spray Place 1 spray into the nose daily.      . Calcium Carbonate-Vitamin D (CALCIUM 600+D) 600-400 MG-UNIT per tablet Take 1 tablet by mouth daily.       . Cholecalciferol (VITAMIN D) 1000 UNITS capsule Take 1,000 Units by mouth daily.        . clotrimazole-betamethasone (LOTRISONE) cream APPLY TO RASH TWICE DAILY.  15 g  2  . conjugated estrogens (PREMARIN) vaginal cream daily.       . ferrous gluconate (FERGON) 325 MG tablet Take 325 mg by mouth daily with breakfast.       .  Fluticasone-Salmeterol (ADVAIR DISKUS) 250-50 MCG/DOSE AEPB Inhale 1 puff into the lungs 2 (two) times daily.  60 each  11  . furosemide (LASIX) 40 MG tablet Take 1 tablet (40 mg total) by mouth daily.  30 tablet  11  . ipratropium (ATROVENT) 0.06 % nasal spray USE 2 SPRAYS EACH NOSTRIL 4 TIMES A DAY.  15 mL  6  . ketoconazole (NIZORAL) 2 % shampoo Use as directed  120 mL  PRN  . lisinopril (PRINIVIL,ZESTRIL) 5 MG tablet TAKE 1 TABLET ONCE DAILY.  30 tablet  6  . metoCLOPramide (REGLAN) 10 MG tablet TAKE ONE TABLET AT BEDTIME.  30 tablet  5  . Multiple Vitamin (MULTIVITAMIN) capsule Take 1 capsule by mouth daily.        . predniSONE (DELTASONE) 5 MG tablet Take 1-2 tabs by mouth once daily  60 tablet  5  . PRILOSEC OTC 20 MG tablet TAKE 1 TABLET ONCE DAILY.  42 tablet  6  . ULORIC 40 MG tablet TAKE 1 TABLET EACH DAY.  30 tablet  6  . ULTRAM 50 MG tablet TAKE ONE TABLET EVERY 6 HOURS AS NEEDED FOR PAIN (MAY TAKE WITH TYLENOL).  90 each  5  . [DISCONTINUED] K-DUR 20 MEQ tablet TAKE 1 TABLET ONCE DAILY.  30 each  6  . atorvastatin (LIPITOR) 20 MG tablet Take 1 tablet (20 mg total) by mouth daily.  30 tablet  PRN  . levofloxacin (LEVAQUIN) 500 MG tablet Take 1 tablet (500 mg total) by mouth daily.  7 tablet  0  . simvastatin (ZOCOR) 40 MG tablet Take 40 mg by mouth at bedtime.        .  [DISCONTINUED] Diphenhyd-Hydrocort-Nystatin (FIRST-DUKES MOUTHWASH) SUSP Gargle and swallow 1 tsp four times daily  120 mL  0  . [DISCONTINUED] moxifloxacin (AVELOX) 400 MG tablet Take 1 tablet (400 mg total) by mouth daily.  7 tablet  0    Allergies  Allergen Reactions  . Allopurinol     REACTION: rash  . Codeine   . Hydrocodone     REACTION: hallucinations  . Morphine     REACTION: hallucinations    Review of Systems       Constitutional:   No  weight loss, night sweats,  Fevers, chills, fatigue, or  lassitude.  HEENT:   No headaches,  Difficulty swallowing,  Tooth/dental problems, or  Sore throat,                No sneezing, itching, ear ache,  +nasal congestion, post nasal drip,   CV:  No chest pain,  Orthopnea, PND, swelling in lower extremities, anasarca, dizziness, palpitations, syncope.   GI  No heartburn, indigestion, abdominal pain, nausea, vomiting, diarrhea, change in bowel habits, loss of appetite, bloody stools.   Resp:   No coughing up of blood.    No chest wall deformity  Skin: no rash or lesions.  GU: no dysuria, change in color of urine, no urgency or frequency.  No flank pain, no hematuria   Psych:  No change in mood or affect. No depression or anxiety.  No memory loss.        Objective:   Physical Exam    79  y/o WF in NAD.  GENERAL:  Alert & oriented; pleasant & cooperative... HEENT:  [place]/AT, EACs-clear, TMs-wnl, NOSE-clear, THROAT-clear & wnl NECK:  Supple w/ decrROM; no JVD; normal carotid impulses w/o bruits; no thyromegaly or nodules palpated; no lymphadenopathy. CHEST: Few bibasilar crackles  HEART:  Regular Rhythm;  gr 1-2/6 SEM without rubs or gallops detected... ABDOMEN:  Soft & nontender; normal bowel sounds; no organomegaly or masses palpated... EXT: +hand deformities, w/ tophi & mod arthritic changes & contractures... right knee arthritis & severe foot deformities as well. no varicose veins/+venous insuffic/ tr edema. DERM:  no  lesions- ecchymoses, thin skin, etc... Diffuse ecchymotic areas along forearms R>L    Assessment & Plan:

## 2012-03-01 ENCOUNTER — Telehealth: Payer: Self-pay | Admitting: Pulmonary Disease

## 2012-03-01 NOTE — Telephone Encounter (Signed)
i spoke with pts son and he is aware of cxr results per TP.  Nothing further is needed.

## 2012-03-01 NOTE — Telephone Encounter (Signed)
Notes Recorded by Julio Sicks, NP on 02/29/2012 at 5:13 PM Increase lasix 2 tabs daily x 3 days then back to 1 tab daily  Low salt diet  Needs ov in 2 weeks with Dr. Kriste Basque And As needed  Please contact office for sooner follow up if symptoms do not improve or worsen or seek emergency care ----  ATC NA unable to leave VM bc it has not been set up yet [name]

## 2012-03-01 NOTE — Telephone Encounter (Signed)
Patient son returned call then hung up

## 2012-03-04 ENCOUNTER — Other Ambulatory Visit: Payer: Medicare PPO

## 2012-03-14 ENCOUNTER — Encounter: Payer: Self-pay | Admitting: Pulmonary Disease

## 2012-03-14 ENCOUNTER — Other Ambulatory Visit (INDEPENDENT_AMBULATORY_CARE_PROVIDER_SITE_OTHER): Payer: Medicare PPO

## 2012-03-14 ENCOUNTER — Ambulatory Visit (INDEPENDENT_AMBULATORY_CARE_PROVIDER_SITE_OTHER): Payer: Medicare PPO | Admitting: Pulmonary Disease

## 2012-03-14 VITALS — BP 122/82 | HR 78 | Temp 98.1°F | Ht 59.0 in | Wt 134.6 lb

## 2012-03-14 DIAGNOSIS — M810 Age-related osteoporosis without current pathological fracture: Secondary | ICD-10-CM

## 2012-03-14 DIAGNOSIS — F411 Generalized anxiety disorder: Secondary | ICD-10-CM

## 2012-03-14 DIAGNOSIS — K5730 Diverticulosis of large intestine without perforation or abscess without bleeding: Secondary | ICD-10-CM

## 2012-03-14 DIAGNOSIS — E785 Hyperlipidemia, unspecified: Secondary | ICD-10-CM

## 2012-03-14 DIAGNOSIS — I10 Essential (primary) hypertension: Secondary | ICD-10-CM

## 2012-03-14 DIAGNOSIS — N39498 Other specified urinary incontinence: Secondary | ICD-10-CM

## 2012-03-14 DIAGNOSIS — I509 Heart failure, unspecified: Secondary | ICD-10-CM

## 2012-03-14 DIAGNOSIS — I359 Nonrheumatic aortic valve disorder, unspecified: Secondary | ICD-10-CM

## 2012-03-14 DIAGNOSIS — J209 Acute bronchitis, unspecified: Secondary | ICD-10-CM

## 2012-03-14 DIAGNOSIS — I739 Peripheral vascular disease, unspecified: Secondary | ICD-10-CM

## 2012-03-14 DIAGNOSIS — K219 Gastro-esophageal reflux disease without esophagitis: Secondary | ICD-10-CM

## 2012-03-14 DIAGNOSIS — M1990 Unspecified osteoarthritis, unspecified site: Secondary | ICD-10-CM

## 2012-03-14 DIAGNOSIS — I872 Venous insufficiency (chronic) (peripheral): Secondary | ICD-10-CM

## 2012-03-14 LAB — BASIC METABOLIC PANEL
BUN: 21 mg/dL (ref 6–23)
Chloride: 105 mEq/L (ref 96–112)
Creatinine, Ser: 0.5 mg/dL (ref 0.4–1.2)
Glucose, Bld: 100 mg/dL — ABNORMAL HIGH (ref 70–99)
Potassium: 4.3 mEq/L (ref 3.5–5.1)

## 2012-03-14 LAB — CBC WITH DIFFERENTIAL/PLATELET
Eosinophils Relative: 0.5 % (ref 0.0–5.0)
Lymphocytes Relative: 12.4 % (ref 12.0–46.0)
Monocytes Absolute: 0.6 10*3/uL (ref 0.1–1.0)
Monocytes Relative: 5 % (ref 3.0–12.0)
Neutrophils Relative %: 81.8 % — ABNORMAL HIGH (ref 43.0–77.0)
Platelets: 222 10*3/uL (ref 150.0–400.0)
RBC: 4.43 Mil/uL (ref 3.87–5.11)
WBC: 11.1 10*3/uL — ABNORMAL HIGH (ref 4.5–10.5)

## 2012-03-14 LAB — HEPATIC FUNCTION PANEL
ALT: 22 U/L (ref 0–35)
AST: 25 U/L (ref 0–37)
Albumin: 3.5 g/dL (ref 3.5–5.2)

## 2012-03-14 LAB — BRAIN NATRIURETIC PEPTIDE: Pro B Natriuretic peptide (BNP): 427 pg/mL — ABNORMAL HIGH (ref 0.0–100.0)

## 2012-03-14 MED ORDER — PREDNISONE 20 MG PO TABS: ORAL_TABLET | ORAL | Status: DC

## 2012-03-14 NOTE — Patient Instructions (Addendum)
Today we updated your med list in our EPIC system...    Continue your current medications the same...  Today we did your follow up blood work...    We will contact you w/ the results when avail...  For the persistent chest congestion>>    Use your NEBULIZER 4 time daily= Breakfast, Lunch, Dinner, Bedtime...    Take the [facility] 1200mg  TWICE daily w/ lots of fluids...    Try the Chest Physiotherapy- lie across the bed w/ head down position to drain the lungs & use the FLUTTER valve...    Use the [id number]- one inhalation TWWICE daily...  Add the PREDNISONE 20mg  tabs -schedule>>    One tab twice daily for 4days,     Then one tab daily x 4d,     Then 1/2 tab daily til gone...    When you are out of the 20mg  tabs, then go back on your 5mg  pills- one daily...  Let's plan a recheck in one month.Marland KitchenMarland Kitchen

## 2012-03-20 NOTE — Progress Notes (Signed)
Subjective:    Patient ID: Andrea Rubio, female    DOB: 29-Jun-1925, 79 y.o.   MRN: [id number]  HPI 79 y/o WF here for a follow up visit... she has multiple medical problems as noted below...   ~  Jun 25, 2010:  766mo ROV & post hosp check> she was [place] x2 in Jan2012 by [facility] w/ Diverticulitis, Dehydration (renal insuffic, incr K+, UTI), severe AS & DiastolicCHF>  They consulted DrKadakia for Cards & she has been to his office "he said everything was fine"...  She went to a NH for rehab x7766mo & has been home x317mo now> ambulating w/ walker & having difficulty w/ legs giving out- getting home therapy thru [place]... [facility] records reviewed in detail; we don't have notes from [place]... She requests a new rolling walker- OK...  CXR today shows cardiomeg, sm right effusion, no edema, bilat shoulder arthroplasties, NAD... Labs look pretty good> see below:  ~  September 24, 2010:  77mo ROV & she is stable, holding her own & doing ok at home w/ her son there to help> in fact her CC today is just some nasal drainage, dripping & we discussed trial Atrovent nasal spray for this... She had a UTI 7/12 & was treated, then saw Urology & they found postmenopausal atrophic vaginitis w/ topical estrogen cream Rx & this has helped she reports...  States breathing is OK, BP well controlled on meds & she still sees [place] for Cards> she reports doing well, seen 466mo ago, no ch in meds...  ~  January 21, 2011:  77mo ROV & she had one interval bronchitic infection, treated w/ Augmentin & improved towards baseline but notes that her legs are giving her trouble, weaker, etc; she desperately needs more PT/OT but she refuses!!!  She has macular degen & is legally blind, dependent on her son to provide help at home;  She notes that 1 y/o sister passed away recently (colon ca) leaving her as the only one of 8 sibs left alive...  > she had AB exac 10/12 & saw TP w/ Augmentin Rx & improved towards her baseline; remains on  Advair250, NEB w/ Albut, Pred5mg /d...  HBP> on Lisinopril5, Lasix40, K20; BP= 126/72 & she denies CP, palpit, ch in SOB, edema, etc...  Cardia> AS & CHF> followed by [id number], meds reviewed, we don't have notes from him...  Hyperlipid> states she is off the Simva40 ?on her own or ?stopped by [place]; she did not bring med list or bottles to review; asked to have Cards send notes to me...  HH, GERD, Divertics> on Prilosec20 & Metaclop10qhs; sister died w/ colon ca- pts last colonoscopy 6/06 by [facility] w/o polyps (+divertics & ischemic colitis)...  DJD, Gout, LBP> on Uloric40 & Tramadol50 prn; she has severe deformities & difficulty getting up & about; she desperately needs more PT but she refuses...  Anxiety> she takes [id number] but declines anxiolytic rx... LABS 12/12:  Chems- wnl x BS=132;  CBC- wnl w/ Hg=13.1;  Iron- sl low at 35 (14%sat);  TSH= 0.87  ~  May 27, 2011:  77mo ROV & Andrea Rubio fell in her bathroom several weeks ago- hit the back of her head & left arm abrasion; EMS checked her & dressed her wound and she decided to see her local physician DrWElkins; he redressed the left arm abrasion & reassured her;  We rechecked her occipital area- sm scab ok to wash etc, and left arm abrasion healing nicely; She knows to be more careful  to avoid falls etc... We reviewed her meds, problem list, & prev labs> see below>>    She tells me she saw [facility] for Cards f/u about a month ago> stable, no changes made (we don't get notes from him)...  ~  October 07, 2011:  77mo ROV     She saw Urology 7/13 for Estring change> this has been controlling her dysuria & is done every 40mo...    She is followed by Susann Givens for Ortho> L1 compression fx ~4/13; on Tramadol & Miacalcin NS, Vit D, & Calcium... We reviewed prob list, meds, xrays and labs> see below>> She requests refill of Nizoral shampoo- ok... LABS 9/13:  FLP- not at goals on Simva40, reminded to take everyday;  Chems- wnl;  CBC- wnl  ~  February 10, 2012:  77mo ROV & Andrea Rubio has had a refractory AB episode w/ persist cough & discolored mucus; now on Avelox & we reviewed the need to maximize her Rx w/ Pred 5mg /d, NEBS Tid, Advair250Bid, Mucinex-2Bid, Fluids, etc... BP controlled on Lisin + Lasix;  Cards followed by ZOXWRUEAV;  She was started on Atorva20 but didn't bring her med bottles to review (as requested)...    We reviewed prob list, meds, xrays and labs> see below for updates >>   ~  March 14, 2012:  77mo ROV & Andrea Rubio has improved but not all the way resolved from her recent AB exac (see below); We reviewed the following medical problems during today's office visit >>     AB> she had AB exac last month & Rx w/ Avelox, Pred, Nebs, Advair250, Mucinex, etc; she is improved but not back to baseline & we are forced to bump Pred to 20mg  tabs- 4d taper + NEBS Qid, max Mucinex, etc...    HBP> now on Cozaar25, Lasix40, K20; BP=122/82 & she denies CP, palpit, ch in SOB, edema, etc...    Cardiac> severeAS & CHF> followed by WUJWJXBJY, meds reviewed, we don't have notes from him...    Hyperlipid> states she is off the Lip20; she did not bring med list or bottles to review; asked to have Cards send notes to me & f/u FLP off meds...    HH, GERD, Divertics> on Prilosec20 & Metaclop10qhs; sister died w/ colon ca- pts last colonoscopy 6/06 by [facility] w/o polyps (+divertics & ischemic colitis)...    DJD, Gout, LBP> on Uloric40 & Tramadol50 prn; she has severe deformities & difficulty getting up & about; she desperately needs more PT but she refuses...    Anxiety> she takes [id number] but declines anxiolytic rx... We reviewed prob list, meds, xrays and labs> see below for updates >>  LABS 2/14:  Chems- wnl;  CBC- wnl;  BNP= 427...         Problem List:    MACULAR DEGENERATION (ICD-362.50) - she is legally blind & son helps out at home...  SINUSITIS, CHRONIC (ICD-473.9) - she is s/p bliat [facility] 1986 by DrKraus... ~  CT Sinus 11/09 showed chr  sinusitis changes in the max & sphenoid regions... ~  8/12: she is c/o drippy nose, trial Atrovent nasal spray...  ASTHMATIC BRONCHITIS, ACUTE (ICD-466.0) - on PRED 5mg /d, ADVAIR 250Bid, & ALBUTEROL NEBS Tid... stable- min cough, w/o sputum, no change in DOE, etc... ~  PFT's 3/06 showed FVC= 1.49 (68%), FEV1= 1.12 (75%), %1sec=75, mid-flows= 43%pred... ~  [facility] 8/09 w/ bilat pneumonia... see DC Summary. ~  CXR 10/09 showed stable cardiomeg, tort Ao, clear lungs, bilat shoulder arthroplasties... ~  CT Angio Chest 11/09 showed neg for PE, fluid/ mucus plugs in depend LL bronchi... ~  CXR 10/10 showed stable cardiomeg, bilat humeral head prostheses, NAD.Marland Kitchen. ~  CXR 1/12 showed COPD, chr changes, ?right basilar opacity ==> subsequent films w/ incr edema. ~  CXR 5/12 showed cardiomeg, sm right effusion, no edema, bilat shoulder arthroplasties, NAD... ~  10/12:  AB exac treated by TP w/ Augmentin & improved... ~  1/14:  AB exac treated w/ Avelox, Pred taper, NEBS qid, Advair250, Mucinex, etc...  HYPERTENSION (ICD-401.9) >>  ~  8/12: her BP's have stabilized at home and measures 112/68 here today... she denies CP, palpit, syncope, ch in edema... ~  12/12:on Lisin5/d, Lasix40/d & K20/d; BP= 126/72 & clinically stable w/o CP, palpit, ch in SOB, etc... ~  4/13: on Lisin5/d, Lasix40/d & K20/d; BP= 108/62 & usually sl better at home; still denies any CP, palpit, ch in SOB, etc... ~  2/14: now on Cozaar25, Lasix40, K20; BP=122/82 & she denies CP, palpit, ch in SOB, edema, etc  AORTIC STENOSIS (ICD-424.1)   << followed by ZOXWRUEAV for Cards >> Hx of CHF (ICD-428.0)  ~  2DEcho 3/06 showed mild AS/AI w/ mod incr AoV thickness & reduced leaflet excursion, mild MR, norm EF=55-65%...  ~  2DEcho 12/08 showed mod inc AoV thickness, mod reduced AV leaflet excursion, c/w mod AS, mild AI & mean grad= ;  norm LVF w/ EF= 65% and no wall motion abn etc... ~  2DEcho 1/12 in [place] showed severeAS & mildAI, mild conc  LVH, norm sys func w/ EF=60-65%, HK at apex, grI DD... ~  EKG 1/12 showed NSR, poor R progression, NSSTTWA... ~  We do not have follow up notes from [facility], pt indicates no change in meds...  PERIPHERAL VASCULAR DISEASE (ICD-443.9) - on ASA 81mg /d... she can't walk enough to elicit discomfort, no rest pain or lesions... ~  ABI's 12/01 showed .88 on R, and .95 on L... ~  MRI showed small vessel dis & small infarct noted...  VENOUS INSUFFICIENCY (ICD-459.81) - on low sodium diet, Lasix, KCl...  HYPERLIPIDEMIA (ICD-272.4) - prev on Zocor 40mg  per WUJWJXBJY- off now;  (prev on Pravachol, but stated intol to other Statins)... takes Fish Oil ~2000mg /d rec by her ophthalmologist... ~  FLP 9/05 ?on Prav40 showed TChol 176, TG 190, HDL 55, LDL 83... ~  FLP 4/10 on diet alone showed TChol 211, TG 153, HDL 44, LDL 128... she refuses med Rx- continue diet efforts. ~  1/12:  Hosp by [facility] & consulted DrKadakia who started her on ZOCOR40mg /d> he is checking her labs now... ~  12/12: she crossed Simva40 off her list ?stopped on her own or ?stopped by [place]... ~  2013:  She still has Simva40 on her list here but NEVER brings a list from [place] or her med bottles to review... ~  9/13:  FLP here showed TChol 206, TG 167, HDL 48, LDL 133... She has agreed to try ATORVASTATIN 20mg /d... ~  2/14: she reports that she is off all statin meds...  HIATAL HERNIA (ICD-553.3) - on PRILOSEC 20mg /d & METACLOPRAMIDE 10mg Qhs... GERD (ICD-530.81) - EGD 3/06 by DrStark showed 3cmHH, mild esophagitis...  DIVERTICULOSIS, COLON (ICD-562.10) - last colonoscopy was 6/06 by [facility] showed divertics and ischemic colitis...  ~  Adm 1/12 by [facility] w/ Diverticulitis, treated & improved, she has not followed up w/ GI; she take 1/2 capful of MIRALAX daily.  STRESS INCONTINENCE (ICD-788.39) - she has hx UTI's & dysuria on &  off... ~  10/10: urine c/s +EColi & Rx'd w/ Cipro. ~  7/12: she reports vaginal estrogen per Urology has helped her  urinary symptoms; note from DrOttelin reviewed. ~  Labs 12/12 w/ normal renal function & Creat=0.7 ~  1013: she had f/u DrOttelin> Hx trophic vaginitis, dysuria, & recurrent UTIs; rec topical esprogen=> estring (he replaced it that day); PVR=100; he rec ROV in 84mo for estring replacement.  DEGENERATIVE JOINT DISEASE (ICD-715.90) - she has severe DJD & Gout w/ deformities- Foot XRay showed Charcot foot deformity, severe degenerative dis, etc...  added TRAMADOL + TYLENOL Prn... GOUT, UNSPECIFIED (ICD-274.9) - labs 4/10 showed Uric Acid level= 7.9.Marland KitchenMarland Kitchen she has hx of gout & hyperuricemia but allergic to Allopurinol w/ rash... therefore on ULORIC 40mg /d... Uric Acid level 5/11 on Uloric40 = 3.2 LOW BACK PAIN (ICD-724.2) NECK PAIN (ICD-723.1) - eval by Susann Givens ==> DrLewitt 2010 w/ MRI & Rx  w/ Lidoderm, Tylenol, Lyrica- now on NEURONTIN... OSTEOPOROSIS (ICD-733.00) - prev on Fosamax (stopped 1/12 in hosp "my kidneys can't handle it"); on Ca++, Vits, & Vit D... ~  labs 4/10 showed Vit D level = 28... rec> start Vit D OTC 1000 u daily. ~  labs 5/11 showed Vit D level = 32... rec incr Vit D to 2000 u daily. ~  2013:  She fell w/ L1 compression & managed by Susann Givens w/ MIACALCIN NS & Tramadol... ~  9/13:  We discussed proceeding w/ BMD here ==> pending  ANXIETY (ICD-300.00) - off prev Alpraz Rx & takes AMITRIPTYLINE 25mg  Qhs... under stress w/ 61 y/o son had MI & stent in 2010.  Hx of ANEMIA (ICD-285.9) - hx anemia req 2U transfusion 6/06 hosp for ischemic colitis...  ~  Hg= 12 - 13 over 2008-9. ~  labs 8/09 hosp showed Hg= 14 ~  labs 4/10 showed Hg= 12.2 ~  labs 3/11 showed Hg= 12.5, MCV= 92, Fe= 22 (9%sat)... start FeSO4 Bid. ~  labs 5/11 showed = 13.0, MCV= 94, Fe= 57 (23%)... continue Fe supplement. ~  Labs 12/12 showed Hg= 13.1, Fe= 35, (14%sat)... rec to continue Fe+VitC Bid... ~  Labs 9/13 showed Hg= 13.7, MCV=94  DERM>  She has skin cancer removed from the right side of her face by DrDanJones  (Spring2012)...   Past Surgical History  Procedure Laterality Date  . Lumbar laminectomy    . Shoulder surgery      bilateral    Outpatient Encounter Prescriptions as of 03/14/2012  Medication Sig Dispense Refill  . albuterol (PROVENTIL) (2.5 MG/3ML) 0.083% nebulizer solution Take 2.5 mg by nebulization 3 (three) times daily as needed.        Marland Kitchen amitriptyline (ELAVIL) 25 MG tablet TAKE ONE TABLET AT BEDTIME.  30 tablet  6  . aspirin 81 MG tablet Take 81 mg by mouth daily.        . Azelastine-Fluticasone 137-50 MCG/ACT SUSP Place 1-2 sprays into the nose 2 (two) times daily as needed.  1 Bottle  5  . Calcium Carbonate-Vitamin D (CALCIUM 600+D) 600-400 MG-UNIT per tablet Take 1 tablet by mouth daily.       . Cholecalciferol (VITAMIN D) 1000 UNITS capsule Take 1,000 Units by mouth daily.        . clotrimazole-betamethasone (LOTRISONE) cream APPLY TO RASH TWICE DAILY.  15 g  2  . conjugated estrogens (PREMARIN) vaginal cream daily.       . ferrous gluconate (FERGON) 325 MG tablet Take 325 mg by mouth daily with breakfast.       .  Fluticasone-Salmeterol (ADVAIR DISKUS) 250-50 MCG/DOSE AEPB Inhale 1 puff into the lungs 2 (two) times daily.  60 each  11  . furosemide (LASIX) 40 MG tablet Take 1 tablet (40 mg total) by mouth daily.  30 tablet  11  . Guaifenesin 1200 MG TB12 Take 1 tablet by mouth 2 (two) times daily.      Marland Kitchen ipratropium (ATROVENT) 0.06 % nasal spray USE 2 SPRAYS EACH NOSTRIL 4 TIMES A DAY.  15 mL  6  . ketoconazole (NIZORAL) 2 % shampoo Use as directed  120 mL  PRN  . losartan (COZAAR) 25 MG tablet Take 1 tablet (25 mg total) by mouth daily.  30 tablet  5  . metoCLOPramide (REGLAN) 10 MG tablet TAKE ONE TABLET AT BEDTIME.  30 tablet  5  . Multiple Vitamin (MULTIVITAMIN) capsule Take 1 capsule by mouth daily.        . potassium chloride SA (K-DUR,KLOR-CON) 20 MEQ tablet TAKE 1 TABLET ONCE DAILY.  30 tablet  5  . predniSONE (DELTASONE) 5 MG tablet Take 1-2 tabs by mouth once daily   60 tablet  5  . PRILOSEC OTC 20 MG tablet TAKE 1 TABLET ONCE DAILY.  42 tablet  6  . simvastatin (ZOCOR) 40 MG tablet Take 40 mg by mouth at bedtime.        Marland Kitchen ULORIC 40 MG tablet TAKE 1 TABLET EACH DAY.  30 tablet  6  . ULTRAM 50 MG tablet TAKE ONE TABLET EVERY 6 HOURS AS NEEDED FOR PAIN (MAY TAKE WITH TYLENOL).  90 each  5  . atorvastatin (LIPITOR) 20 MG tablet Take 1 tablet (20 mg total) by mouth daily.  30 tablet  PRN  . calcitonin, salmon, (MIACALCIN/FORTICAL) 200 UNIT/ACT nasal spray Place 1 spray into the nose daily.      Marland Kitchen levofloxacin (LEVAQUIN) 500 MG tablet Take 1 tablet (500 mg total) by mouth daily.  7 tablet  0  . lisinopril (PRINIVIL,ZESTRIL) 5 MG tablet TAKE 1 TABLET ONCE DAILY.  30 tablet  6  . predniSONE (DELTASONE) 20 MG tablet Take 1 tablet twice day x 4 days,then 1 tablet daily x 4 days then 1/2 tab dail til gone  15 tablet  0   No facility-administered encounter medications on file as of 03/14/2012.    Allergies  Allergen Reactions  . Allopurinol     REACTION: rash  . Codeine   . Hydrocodone     REACTION: hallucinations  . Morphine     REACTION: hallucinations    Current Medications, Allergies, Past Medical History, Past Surgical History, Family History, and Social History were reviewed in [employer] record.    Review of Systems        See HPI - all other systems neg except as noted... The patient complains of dyspnea on exertion, peripheral edema, abdominal pain, severe indigestion/heartburn, muscle weakness, and difficulty walking.  The patient denies anorexia, fever, weight loss, weight gain, vision loss, decreased hearing, hoarseness, chest pain, syncope, prolonged cough, headaches, hemoptysis, melena, hematochezia, hematuria, incontinence, suspicious skin lesions, transient blindness, depression, unusual weight change, abnormal bleeding, enlarged lymph nodes, and angioedema.     Objective:   Physical Exam    WD, Petite, sl  overweight, 79 y/o WF in NAD... she is <5' tall, weight 140# GENERAL:  Alert & oriented; pleasant & cooperative... HEENT:  [place]/AT, EACs-clear, TMs-wnl, NOSE-clear, THROAT-clear & wnl... known macular degen per ophthal... NECK:  Supple w/ decrROM; no JVD; normal carotid impulses w/o  bruits; no thyromegaly or nodules palpated; no lymphadenopathy. CHEST:  Decr BS bilat, Coarse BS, no wheezing or rhonchi... HEART:  Regular Rhythm;  gr 1-2/6 SEM without rubs or gallops detected... ABDOMEN:  Soft & nontender; normal bowel sounds; no organomegaly or masses palpated... EXT: +hand deformities, w/ tophi & mod arthritic changes & contractures... right knee arthritis & severe foot deformities as well. no varicose veins/+venous insuffic/ tr edema. NEURO:  CN's intact x reduced vision... diffusely weak, without focal changes... +gait abn... DERM:  Abrasion over occiput & on left arm; +ecchymoses, thin skin, etc...  RADIOLOGY DATA:  Reviewed in the EPIC EMR & discussed w/ the patient...  LABORATORY DATA:  Reviewed in the EPIC EMR & discussed w/ the patient...   Assessment & Plan:    AB>  We discussed the need to avoid infections & maximize her home resp treatments...  HBP & Diastolic CHF>  Controlled on meds- Losatan & Lasix; continue same; she will f/u w/ ZOXWRUEAV as planned.  AS>  Followed by WUJWJXBJY for her severe AS, not a surg candidate...  HYPERLIPID>  Off Simva40 now ?per Cards or on her own; she will check w/ DrKadakia==> we don't have notes, apparently off all meds, she agrees to try ATORVASTATIN20=> stopped on her own...  GI>  Hx HH, GERD, Divertics, Colitis>  On Prilosec, Reglan 10mg  Qhs (she does not want to stop this med), & Miralax as noted...  GU>  Dysuria improved w/ vaginal cream (no estring) & it is changed Q55mo...  DJD/ Gout/ Neck&Back Pain>  As noted- on Tramadol, Tylenol, Uloric; she is weak in the legs, hard to stand & walk, I'm afraid she will become immobile, unable to  stand or walk making self care difficult but she is unperturbed by this & refuses my rec for more home PT...  Anxiety>  She has Amitriptyline 25Qhs, not on anxiolytic...   Patient's Medications  New Prescriptions   PREDNISONE (DELTASONE) 20 MG TABLET    Take 1 tablet twice day x 4 days,then 1 tablet daily x 4 days then 1/2 tab dail til gone  Previous Medications   ALBUTEROL (PROVENTIL) (2.5 MG/3ML) 0.083% NEBULIZER SOLUTION    Take 2.5 mg by nebulization 3 (three) times daily as needed.     AMITRIPTYLINE (ELAVIL) 25 MG TABLET    TAKE ONE TABLET AT BEDTIME.   ASPIRIN 81 MG TABLET    Take 81 mg by mouth daily.     ATORVASTATIN (LIPITOR) 20 MG TABLET    Take 1 tablet (20 mg total) by mouth daily.   AZELASTINE-FLUTICASONE 137-50 MCG/ACT SUSP    Place 1-2 sprays into the nose 2 (two) times daily as needed.   CALCITONIN, SALMON, (MIACALCIN/FORTICAL) 200 UNIT/ACT NASAL SPRAY    Place 1 spray into the nose daily.   CALCIUM CARBONATE-VITAMIN D (CALCIUM 600+D) 600-400 MG-UNIT PER TABLET    Take 1 tablet by mouth daily.    CHOLECALCIFEROL (VITAMIN D) 1000 UNITS CAPSULE    Take 1,000 Units by mouth daily.     CLOTRIMAZOLE-BETAMETHASONE (LOTRISONE) CREAM    APPLY TO RASH TWICE DAILY.   CONJUGATED ESTROGENS (PREMARIN) VAGINAL CREAM    daily.    FERROUS GLUCONATE (FERGON) 325 MG TABLET    Take 325 mg by mouth daily with breakfast.    FLUTICASONE-SALMETEROL (ADVAIR DISKUS) 250-50 MCG/DOSE AEPB    Inhale 1 puff into the lungs 2 (two) times daily.   FUROSEMIDE (LASIX) 40 MG TABLET    Take 1 tablet (40 mg total)  by mouth daily.   GUAIFENESIN 1200 MG TB12    Take 1 tablet by mouth 2 (two) times daily.   IPRATROPIUM (ATROVENT) 0.06 % NASAL SPRAY    USE 2 SPRAYS EACH NOSTRIL 4 TIMES A DAY.   KETOCONAZOLE (NIZORAL) 2 % SHAMPOO    Use as directed   LEVOFLOXACIN (LEVAQUIN) 500 MG TABLET    Take 1 tablet (500 mg total) by mouth daily.   LISINOPRIL (PRINIVIL,ZESTRIL) 5 MG TABLET    TAKE 1 TABLET ONCE DAILY.    LOSARTAN (COZAAR) 25 MG TABLET    Take 1 tablet (25 mg total) by mouth daily.   METOCLOPRAMIDE (REGLAN) 10 MG TABLET    TAKE ONE TABLET AT BEDTIME.   MULTIPLE VITAMIN (MULTIVITAMIN) CAPSULE    Take 1 capsule by mouth daily.     POTASSIUM CHLORIDE SA (K-DUR,KLOR-CON) 20 MEQ TABLET    TAKE 1 TABLET ONCE DAILY.   PREDNISONE (DELTASONE) 5 MG TABLET    Take 1-2 tabs by mouth once daily   PRILOSEC OTC 20 MG TABLET    TAKE 1 TABLET ONCE DAILY.   SIMVASTATIN (ZOCOR) 40 MG TABLET    Take 40 mg by mouth at bedtime.     ULORIC 40 MG TABLET    TAKE 1 TABLET EACH DAY.   ULTRAM 50 MG TABLET    TAKE ONE TABLET EVERY 6 HOURS AS NEEDED FOR PAIN (MAY TAKE WITH TYLENOL).  Modified Medications   No medications on file  Discontinued Medications   No medications on file

## 2012-04-06 ENCOUNTER — Ambulatory Visit (INDEPENDENT_AMBULATORY_CARE_PROVIDER_SITE_OTHER)
Admission: RE | Admit: 2012-04-06 | Discharge: 2012-04-06 | Disposition: A | Discharge status: Home / Self Care | Payer: Medicare PPO | Source: Ambulatory Visit | Attending: Pulmonary Disease | Admitting: Pulmonary Disease

## 2012-04-06 DIAGNOSIS — M810 Age-related osteoporosis without current pathological fracture: Secondary | ICD-10-CM

## 2012-04-12 ENCOUNTER — Other Ambulatory Visit: Payer: Self-pay | Admitting: Pulmonary Disease

## 2012-04-18 ENCOUNTER — Encounter: Payer: Self-pay | Admitting: Pulmonary Disease

## 2012-04-20 ENCOUNTER — Ambulatory Visit (INDEPENDENT_AMBULATORY_CARE_PROVIDER_SITE_OTHER): Payer: Medicare PPO | Admitting: Pulmonary Disease

## 2012-04-20 ENCOUNTER — Encounter: Payer: Self-pay | Admitting: Pulmonary Disease

## 2012-04-20 VITALS — BP 108/72 | HR 82 | Temp 97.8°F | Ht 60.0 in | Wt 135.0 lb

## 2012-04-20 DIAGNOSIS — I739 Peripheral vascular disease, unspecified: Secondary | ICD-10-CM

## 2012-04-20 DIAGNOSIS — J209 Acute bronchitis, unspecified: Secondary | ICD-10-CM

## 2012-04-20 DIAGNOSIS — I872 Venous insufficiency (chronic) (peripheral): Secondary | ICD-10-CM

## 2012-04-20 DIAGNOSIS — I10 Essential (primary) hypertension: Secondary | ICD-10-CM

## 2012-04-20 DIAGNOSIS — E785 Hyperlipidemia, unspecified: Secondary | ICD-10-CM

## 2012-04-20 DIAGNOSIS — F411 Generalized anxiety disorder: Secondary | ICD-10-CM

## 2012-04-20 DIAGNOSIS — I509 Heart failure, unspecified: Secondary | ICD-10-CM

## 2012-04-20 DIAGNOSIS — N39498 Other specified urinary incontinence: Secondary | ICD-10-CM

## 2012-04-20 DIAGNOSIS — I359 Nonrheumatic aortic valve disorder, unspecified: Secondary | ICD-10-CM

## 2012-04-20 DIAGNOSIS — K219 Gastro-esophageal reflux disease without esophagitis: Secondary | ICD-10-CM

## 2012-04-20 DIAGNOSIS — M810 Age-related osteoporosis without current pathological fracture: Secondary | ICD-10-CM

## 2012-04-20 DIAGNOSIS — K5730 Diverticulosis of large intestine without perforation or abscess without bleeding: Secondary | ICD-10-CM

## 2012-04-20 DIAGNOSIS — M1990 Unspecified osteoarthritis, unspecified site: Secondary | ICD-10-CM

## 2012-04-20 NOTE — Progress Notes (Signed)
Subjective:    Patient ID: Andrea Rubio, female    DOB: Feb 20, 1925, 79 y.o.   MRN: [id number]  HPI 79 y/o WF here for a follow up visit... she has multiple medical problems as noted below...   ~  January 21, 2011:  110mo ROV & she had one interval bronchitic infection, treated w/ Augmentin & improved towards baseline but notes that her legs are giving her trouble, weaker, etc; she desperately needs more PT/OT but she refuses!!!  She has macular degen & is legally blind, dependent on her son to provide help at home;  She notes that 46 y/o sister passed away recently (colon ca) leaving her as the only one of 8 sibs left alive...  > she had AB exac 10/12 & saw TP w/ Augmentin Rx & improved towards her baseline; remains on Advair250, NEB w/ Albut, Pred5mg /d...  HBP> on Lisinopril5, Lasix40, K20; BP= 126/72 & she denies CP, palpit, ch in SOB, edema, etc...  Cardia> AS & CHF> followed by [id number], meds reviewed, we don't have notes from him...  Hyperlipid> states she is off the Simva40 ?on her own or ?stopped by [place]; she did not bring med list or bottles to review; asked to have Cards send notes to me...  HH, GERD, Divertics> on Prilosec20 & Metaclop10qhs; sister died w/ colon ca- pts last colonoscopy 6/06 by [facility] w/o polyps (+divertics & ischemic colitis)...  DJD, Gout, LBP> on Uloric40 & Tramadol50 prn; she has severe deformities & difficulty getting up & about; she desperately needs more PT but she refuses...  Anxiety> she takes [id number] but declines anxiolytic rx... LABS 12/12:  Chems- wnl x BS=132;  CBC- wnl w/ Hg=13.1;  Iron- sl low at 35 (14%sat);  TSH= 0.87  ~  May 27, 2011:  110mo ROV & Andrea Rubio fell in her bathroom several weeks ago- hit the back of her head & left arm abrasion; EMS checked her & dressed her wound and she decided to see her local physician DrWElkins; he redressed the left arm abrasion & reassured her;  We rechecked her occipital area- sm scab ok to wash etc, and  left arm abrasion healing nicely; She knows to be more careful to avoid falls etc... We reviewed her meds, problem list, & prev labs> see below>>    She tells me she saw [facility] for Cards f/u about a month ago> stable, no changes made (we don't get notes from him)...  ~  October 07, 2011:  110mo ROV     She saw Urology 7/13 for Estring change> this has been controlling her dysuria & is done every 866mo...    She is followed by Susann Givens for Ortho> L1 compression fx ~4/13; on Tramadol & Miacalcin NS, Vit D, & Calcium... We reviewed prob list, meds, xrays and labs> see below>> She requests refill of Nizoral shampoo- ok... LABS 9/13:  FLP- not at goals on Simva40, reminded to take everyday;  Chems- wnl;  CBC- wnl  ~  February 10, 2012:  110mo ROV & Andrea Rubio has had a refractory AB episode w/ persist cough & discolored mucus; now on Avelox & we reviewed the need to maximize her Rx w/ Pred 5mg /d, NEBS Tid, Advair250Bid, Mucinex-2Bid, Fluids, etc... BP controlled on Lisin + Lasix;  Cards followed by [id number];  She was started on Atorva20 but didn't bring her med bottles to review (as requested)...    We reviewed prob list, meds, xrays and labs> see below for updates >>   ~  March 14, 2012:  74mo ROV & Andrea Rubio has improved but not all the way resolved from her recent AB exac (see below); We reviewed the following medical problems during today's office visit >>     AB> she had AB exac last month & Rx w/ Avelox, Pred, Nebs, Advair250, Mucinex, etc; she is improved but not back to baseline & we are forced to bump Pred to 20mg  tabs- 4d taper + NEBS Qid, max Mucinex, etc...    HBP> now on Cozaar25, Lasix40, K20; BP=122/82 & she denies CP, palpit, ch in SOB, edema, etc...    Cardiac> severeAS & CHF> followed by [email or web address], meds reviewed, we don't have notes from him...    Hyperlipid> states she is off the Lip20; she did not bring med list or bottles to review; asked to have Cards send notes to me & f/u FLP off meds...     HH, GERD, Divertics> on Prilosec20 & Metaclop10qhs; sister died w/ colon ca- pts last colonoscopy 6/06 by [facility] w/o polyps (+divertics & ischemic colitis)...    DJD, Gout, LBP> on Uloric40 & Tramadol50 prn; she has severe deformities & difficulty getting up & about; she desperately needs more PT but she refuses...    Osteoporosis> on Miacalcin NS;     Anxiety> she takes [id number] but declines anxiolytic rx... We reviewed prob list, meds, xrays and labs> see below for updates >>  LABS 2/14:  Chems- wnl;  CBC- wnl;  BNP= 427...   ~  April 20, 2012:  74mo ROV & Andrea Rubio has stabilized by bumping up Pred, now weaned back down to 5mg /d plus her max regimen... She is managing as best she can w/ help of her son at home; notes some pain in her left hip- known osteoporosis & BMD 3/14 showed TScores +0.7 in Spine, and -2.3 in left Fem Neck, and -1.9 in Forearm radius; she's been on the Miacalcin NS "it seems to be helping" she says, therefore continue the same for now...  BP stable, denies CP, palpit, ch in SOB etc... She has severe AS followed by [facility] but apparently there is not much he can do... We reviewed prob list, meds, xrays and labs> see below for updates >> we will recheck her in 2-3 months...         Problem List:    MACULAR DEGENERATION (ICD-362.50) - she is legally blind & son helps out at home...  SINUSITIS, CHRONIC (ICD-473.9) - she is s/p bliat [facility] 1986 by DrKraus... ~  CT Sinus 11/09 showed chr sinusitis changes in the max & sphenoid regions... ~  8/12: she is c/o drippy nose, trial Atrovent nasal spray...  ASTHMATIC BRONCHITIS, ACUTE (ICD-466.0) - on PRED 5mg /d, ADVAIR 250Bid, & ALBUTEROL NEBS Tid... stable- min cough, w/o sputum, no change in DOE, etc... ~  PFT's 3/06 showed FVC= 1.49 (68%), FEV1= 1.12 (75%), %1sec=75, mid-flows= 43%pred... ~  [facility] 8/09 w/ bilat pneumonia... see DC Summary. ~  CXR 10/09 showed stable cardiomeg, tort Ao, clear lungs, bilat shoulder  arthroplasties... ~  CT Angio Chest 11/09 showed neg for PE, fluid/ mucus plugs in depend LL bronchi... ~  CXR 10/10 showed stable cardiomeg, bilat humeral head prostheses, NAD.Marland Kitchen. ~  CXR 1/12 showed COPD, chr changes, ?right basilar opacity ==> subsequent films w/ incr edema. ~  CXR 5/12 showed cardiomeg, sm right effusion, no edema, bilat shoulder arthroplasties, NAD... ~  10/12:  AB exac treated by TP w/ Augmentin & improved... ~  1/14:  AB exac treated w/  Avelox, Pred taper, NEBS qid, Advair250, Mucinex, etc... ~  3/14: back on Pred 5mg /d and stable once again...  HYPERTENSION (ICD-401.9) >>  ~  8/12: her BP's have stabilized at home and measures 112/68 here today... she denies CP, palpit, syncope, ch in edema... ~  12/12:on Lisin5/d, Lasix40/d & K20/d; BP= 126/72 & clinically stable w/o CP, palpit, ch in SOB, etc... ~  4/13: on Lisin5/d, Lasix40/d & K20/d; BP= 108/62 & usually sl better at home; still denies any CP, palpit, ch in SOB, etc... ~  2/14: now on Cozaar25, Lasix40, K20; BP=122/82 & she denies CP, palpit, ch in SOB, edema, etc  AORTIC STENOSIS (ICD-424.1)   << followed by ZOXWRUEAV for Cards >> Hx of CHF (ICD-428.0)  ~  2DEcho 3/06 showed mild AS/AI w/ mod incr AoV thickness & reduced leaflet excursion, mild MR, norm EF=55-65%...  ~  2DEcho 12/08 showed mod inc AoV thickness, mod reduced AV leaflet excursion, c/w mod AS, mild AI & mean grad= ;  norm LVF w/ EF= 65% and no wall motion abn etc... ~  2DEcho 1/12 in [place] showed severeAS & mildAI, mild conc LVH, norm sys func w/ EF=60-65%, HK at apex, grI DD... ~  EKG 1/12 showed NSR, poor R progression, NSSTTWA... ~  We do not have follow up notes from [facility], pt indicates no change in meds...  PERIPHERAL VASCULAR DISEASE (ICD-443.9) - on ASA 81mg /d... she can't walk enough to elicit discomfort, no rest pain or lesions... ~  ABI's 12/01 showed .88 on R, and .95 on L... ~  MRI showed small vessel dis & small infarct  noted...  VENOUS INSUFFICIENCY (ICD-459.81) - on low sodium diet, Lasix, KCl...  HYPERLIPIDEMIA (ICD-272.4) - prev on Zocor 40mg  per WUJWJXBJY- off now;  (prev on Pravachol, but stated intol to other Statins)... takes Fish Oil ~2000mg /d rec by her ophthalmologist... ~  FLP 9/05 ?on Prav40 showed TChol 176, TG 190, HDL 55, LDL 83... ~  FLP 4/10 on diet alone showed TChol 211, TG 153, HDL 44, LDL 128... she refuses med Rx- continue diet efforts. ~  1/12:  Hosp by [facility] & consulted DrKadakia who started her on ZOCOR40mg /d> he is checking her labs now... ~  12/12: she crossed Simva40 off her list ?stopped on her own or ?stopped by [place]... ~  2013:  She still has Simva40 on her list here but NEVER brings a list from [place] or her med bottles to review... ~  9/13:  FLP here showed TChol 206, TG 167, HDL 48, LDL 133... She has agreed to try ATORVASTATIN 20mg /d... ~  2/14: she reports that she is off all statin meds...  HIATAL HERNIA (ICD-553.3) - on PRILOSEC 20mg /d & METACLOPRAMIDE 10mg Qhs... GERD (ICD-530.81) - EGD 3/06 by DrStark showed 3cmHH, mild esophagitis...  DIVERTICULOSIS, COLON (ICD-562.10) - last colonoscopy was 6/06 by [facility] showed divertics and ischemic colitis...  ~  Adm 1/12 by [facility] w/ Diverticulitis, treated & improved, she has not followed up w/ GI; she take 1/2 capful of MIRALAX daily.  STRESS INCONTINENCE (ICD-788.39) - she has hx UTI's & dysuria on & off... ~  10/10: urine c/s +EColi & Rx'd w/ Cipro. ~  7/12: she reports vaginal estrogen per Urology has helped her urinary symptoms; note from DrOttelin reviewed. ~  Labs 12/12 w/ normal renal function & Creat=0.7 ~  1013: she had f/u DrOttelin> Hx trophic vaginitis, dysuria, & recurrent UTIs; rec topical esprogen=> estring (he replaced it that day); PVR=100; he rec ROV in 70mo for  estring replacement.  DEGENERATIVE JOINT DISEASE (ICD-715.90) - she has severe DJD & Gout w/ deformities- Foot XRay showed Charcot foot deformity, severe  degenerative dis, etc...  added TRAMADOL + TYLENOL Prn... GOUT, UNSPECIFIED (ICD-274.9) - labs 4/10 showed Uric Acid level= 7.9.Marland KitchenMarland Kitchen she has hx of gout & hyperuricemia but allergic to Allopurinol w/ rash... therefore on ULORIC 40mg /d... Uric Acid level 5/11 on Uloric40 = 3.2 LOW BACK PAIN (ICD-724.2) NECK PAIN (ICD-723.1) - eval by Susann Givens ==> DrLewitt 2010 w/ MRI & Rx  w/ Lidoderm, Tylenol, Lyrica- now on NEURONTIN... OSTEOPOROSIS (ICD-733.00) - prev on Fosamax (stopped 1/12 in hosp "my kidneys can't handle it"); on Ca++, Vits, & Vit D... ~  labs 4/10 showed Vit D level = 28... rec> start Vit D OTC 1000 u daily. ~  labs 5/11 showed Vit D level = 32... rec incr Vit D to 2000 u daily. ~  2013:  She fell w/ L1 compression & managed by Susann Givens w/ MIACALCIN NS & Tramadol... ~  3/14:  BMD here showed TScores +0.7 in Spine, and -2.3 in left Fem Neck, and -1.9 in Forearm radius; she feels the Miacalcin is helping, therefore continue same...  ANXIETY (ICD-300.00) - off prev Alpraz Rx & takes AMITRIPTYLINE 25mg  Qhs... under stress w/ 34 y/o son had MI & stent in 2010.  Hx of ANEMIA (ICD-285.9) - hx anemia req 2U transfusion 6/06 hosp for ischemic colitis...  ~  Hg= 12 - 13 over 2008-9. ~  labs 8/09 hosp showed Hg= 14 ~  labs 4/10 showed Hg= 12.2 ~  labs 3/11 showed Hg= 12.5, MCV= 92, Fe= 22 (9%sat)... start FeSO4 Bid. ~  labs 5/11 showed = 13.0, MCV= 94, Fe= 57 (23%)... continue Fe supplement. ~  Labs 12/12 showed Hg= 13.1, Fe= 35, (14%sat)... rec to continue Fe+VitC Bid... ~  Labs 9/13 showed Hg= 13.7, MCV=94  DERM>  She has skin cancer removed from the right side of her face by DrDanJones (Spring2012)...   Past Surgical History  Procedure Laterality Date  . Lumbar laminectomy    . Shoulder surgery      bilateral    Outpatient Encounter Prescriptions as of 04/20/2012  Medication Sig Dispense Refill  . albuterol (PROVENTIL) (2.5 MG/3ML) 0.083% nebulizer solution Take 2.5 mg by nebulization 3  (three) times daily as needed.        Marland Kitchen amitriptyline (ELAVIL) 25 MG tablet TAKE ONE TABLET AT BEDTIME.  30 tablet  6  . aspirin 81 MG tablet Take 81 mg by mouth daily.        . Azelastine-Fluticasone 137-50 MCG/ACT SUSP Place 1-2 sprays into the nose 2 (two) times daily as needed.  1 Bottle  5  . calcitonin, salmon, (MIACALCIN/FORTICAL) 200 UNIT/ACT nasal spray Place 1 spray into the nose daily.      . Calcium Carbonate-Vitamin D (CALCIUM 600+D) 600-400 MG-UNIT per tablet Take 1 tablet by mouth daily.       . Cholecalciferol (VITAMIN D) 1000 UNITS capsule Take 1,000 Units by mouth daily.        . clotrimazole-betamethasone (LOTRISONE) cream APPLY TO RASH TWICE DAILY.  15 g  2  . conjugated estrogens (PREMARIN) vaginal cream daily.       . ferrous gluconate (FERGON) 325 MG tablet Take 325 mg by mouth daily with breakfast.       . Fluticasone-Salmeterol (ADVAIR DISKUS) 250-50 MCG/DOSE AEPB Inhale 1 puff into the lungs 2 (two) times daily.  60 each  11  . furosemide (LASIX) 40  MG tablet Take 1 tablet (40 mg total) by mouth daily.  30 tablet  11  . Guaifenesin 1200 MG TB12 Take 1 tablet by mouth 2 (two) times daily.      Marland Kitchen ipratropium (ATROVENT) 0.06 % nasal spray USE 2 SPRAYS EACH NOSTRIL 4 TIMES A DAY.  15 mL  6  . ketoconazole (NIZORAL) 2 % shampoo Use as directed  120 mL  PRN  . lisinopril (PRINIVIL,ZESTRIL) 5 MG tablet TAKE 1 TABLET ONCE DAILY.  30 tablet  6  . losartan (COZAAR) 25 MG tablet Take 1 tablet (25 mg total) by mouth daily.  30 tablet  5  . metoCLOPramide (REGLAN) 10 MG tablet TAKE ONE TABLET AT BEDTIME.  30 tablet  5  . Multiple Vitamin (MULTIVITAMIN) capsule Take 1 capsule by mouth daily.        . potassium chloride SA (K-DUR,KLOR-CON) 20 MEQ tablet TAKE 1 TABLET ONCE DAILY.  30 tablet  5  . predniSONE (DELTASONE) 20 MG tablet Take 1 tablet twice day x 4 days,then 1 tablet daily x 4 days then 1/2 tab dail til gone  15 tablet  0  . predniSONE (DELTASONE) 5 MG tablet Take 1-2 tabs  by mouth once daily  60 tablet  5  . simvastatin (ZOCOR) 40 MG tablet Take 40 mg by mouth at bedtime.        Marland Kitchen ULORIC 40 MG tablet TAKE 1 TABLET EACH DAY.  30 tablet  6  . ULTRAM 50 MG tablet TAKE ONE TABLET EVERY 6 HOURS AS NEEDED FOR PAIN (MAY TAKE WITH TYLENOL).  90 each  5  . atorvastatin (LIPITOR) 20 MG tablet Take 1 tablet (20 mg total) by mouth daily.  30 tablet  PRN  . levofloxacin (LEVAQUIN) 500 MG tablet Take 1 tablet (500 mg total) by mouth daily.  7 tablet  0  . PRILOSEC OTC 20 MG tablet TAKE 1 TABLET ONCE DAILY.  42 tablet  6   No facility-administered encounter medications on file as of 04/20/2012.    Allergies  Allergen Reactions  . Allopurinol     REACTION: rash  . Codeine   . Hydrocodone     REACTION: hallucinations  . Morphine     REACTION: hallucinations    Current Medications, Allergies, Past Medical History, Past Surgical History, Family History, and Social History were reviewed in [employer] record.    Review of Systems        See HPI - all other systems neg except as noted... The patient complains of dyspnea on exertion, peripheral edema, abdominal pain, severe indigestion/heartburn, muscle weakness, and difficulty walking.  The patient denies anorexia, fever, weight loss, weight gain, vision loss, decreased hearing, hoarseness, chest pain, syncope, prolonged cough, headaches, hemoptysis, melena, hematochezia, hematuria, incontinence, suspicious skin lesions, transient blindness, depression, unusual weight change, abnormal bleeding, enlarged lymph nodes, and angioedema.     Objective:   Physical Exam    WD, Petite, sl overweight, 79 y/o WF in NAD... she is <5' tall, weight 140# GENERAL:  Alert & oriented; pleasant & cooperative... HEENT:  [place]/AT, EACs-clear, TMs-wnl, NOSE-clear, THROAT-clear & wnl... known macular degen per ophthal... NECK:  Supple w/ decrROM; no JVD; normal carotid impulses w/o bruits; no thyromegaly or nodules  palpated; no lymphadenopathy. CHEST:  Decr BS bilat, Coarse BS, no wheezing or rhonchi... HEART:  Regular Rhythm;  gr 1-2/6 SEM without rubs or gallops detected... ABDOMEN:  Soft & nontender; normal bowel sounds; no organomegaly or masses palpated... EXT: +  hand deformities, w/ tophi & mod arthritic changes & contractures... right knee arthritis & severe foot deformities as well. no varicose veins/+venous insuffic/ tr edema. NEURO:  CN's intact x reduced vision... diffusely weak, without focal changes... +gait abn... DERM:  Abrasion over occiput & on left arm; +ecchymoses, thin skin, etc...  RADIOLOGY DATA:  Reviewed in the EPIC EMR & discussed w/ the patient...  LABORATORY DATA:  Reviewed in the EPIC EMR & discussed w/ the patient...   Assessment & Plan:    AB>  We discussed the need to avoid infections & maximize her home resp treatments...  HBP & Diastolic CHF>  Controlled on meds- Losatan & Lasix; continue same; she will f/u w/ [id number] as planned.  AS>  Followed by [id number] for her severe AS, not a surg candidate...  HYPERLIPID>  Off Simva40 now ?per Cards or on her own; she will check w/ DrKadakia==> we don't have notes, apparently off all meds, she agrees to try ATORVASTATIN20=> stopped on her own...  GI>  Hx HH, GERD, Divertics, Colitis>  On Prilosec, Reglan 10mg  Qhs (she does not want to stop this med), & Miralax as noted...  GU>  Dysuria improved w/ vaginal cream (no estring) & it is changed Q5mo...  DJD/ Gout/ Neck&Back Pain>  As noted- on Tramadol, Tylenol, Uloric; she is weak in the legs, hard to stand & walk, I'm afraid she will become immobile, unable to stand or walk making self care difficult but she is unperturbed by this & refuses my rec for more home PT...  Osteoporosis>  BMD w/ TScore -2.3 in [facility]; she is on Miacalcin NS & feels this is helping- continue same...  Anxiety>  She has Amitriptyline 25Qhs, not on anxiolytic...   Patient's Medications  New  Prescriptions   No medications on file  Previous Medications   ALBUTEROL (PROVENTIL) (2.5 MG/3ML) 0.083% NEBULIZER SOLUTION    Take 2.5 mg by nebulization 3 (three) times daily as needed.     AMITRIPTYLINE (ELAVIL) 25 MG TABLET    TAKE ONE TABLET AT BEDTIME.   ASPIRIN 81 MG TABLET    Take 81 mg by mouth daily.     ATORVASTATIN (LIPITOR) 20 MG TABLET    Take 1 tablet (20 mg total) by mouth daily.   AZELASTINE-FLUTICASONE 137-50 MCG/ACT SUSP    Place 1-2 sprays into the nose 2 (two) times daily as needed.   CALCITONIN, SALMON, (MIACALCIN/FORTICAL) 200 UNIT/ACT NASAL SPRAY    Place 1 spray into the nose daily.   CALCIUM CARBONATE-VITAMIN D (CALCIUM 600+D) 600-400 MG-UNIT PER TABLET    Take 1 tablet by mouth daily.    CHOLECALCIFEROL (VITAMIN D) 1000 UNITS CAPSULE    Take 1,000 Units by mouth daily.     CLOTRIMAZOLE-BETAMETHASONE (LOTRISONE) CREAM    APPLY TO RASH TWICE DAILY.   CONJUGATED ESTROGENS (PREMARIN) VAGINAL CREAM    daily.    FERROUS GLUCONATE (FERGON) 325 MG TABLET    Take 325 mg by mouth daily with breakfast.    FLUTICASONE-SALMETEROL (ADVAIR DISKUS) 250-50 MCG/DOSE AEPB    Inhale 1 puff into the lungs 2 (two) times daily.   FUROSEMIDE (LASIX) 40 MG TABLET    Take 1 tablet (40 mg total) by mouth daily.   GUAIFENESIN 1200 MG TB12    Take 1 tablet by mouth 2 (two) times daily.   IPRATROPIUM (ATROVENT) 0.06 % NASAL SPRAY    USE 2 SPRAYS EACH NOSTRIL 4 TIMES A DAY.   KETOCONAZOLE (NIZORAL) 2 % SHAMPOO  Use as directed   LEVOFLOXACIN (LEVAQUIN) 500 MG TABLET    Take 1 tablet (500 mg total) by mouth daily.   LISINOPRIL (PRINIVIL,ZESTRIL) 5 MG TABLET    TAKE 1 TABLET ONCE DAILY.   LOSARTAN (COZAAR) 25 MG TABLET    Take 1 tablet (25 mg total) by mouth daily.   METOCLOPRAMIDE (REGLAN) 10 MG TABLET    TAKE ONE TABLET AT BEDTIME.   MULTIPLE VITAMIN (MULTIVITAMIN) CAPSULE    Take 1 capsule by mouth daily.     POTASSIUM CHLORIDE SA (K-DUR,KLOR-CON) 20 MEQ TABLET    TAKE 1 TABLET ONCE DAILY.    PREDNISONE (DELTASONE) 20 MG TABLET    Take 1 tablet twice day x 4 days,then 1 tablet daily x 4 days then 1/2 tab dail til gone   PREDNISONE (DELTASONE) 5 MG TABLET    Take 1-2 tabs by mouth once daily   PRILOSEC OTC 20 MG TABLET    TAKE 1 TABLET ONCE DAILY.   SIMVASTATIN (ZOCOR) 40 MG TABLET    Take 40 mg by mouth at bedtime.     ULORIC 40 MG TABLET    TAKE 1 TABLET EACH DAY.   ULTRAM 50 MG TABLET    TAKE ONE TABLET EVERY 6 HOURS AS NEEDED FOR PAIN (MAY TAKE WITH TYLENOL).  Modified Medications   No medications on file  Discontinued Medications   No medications on file

## 2012-04-20 NOTE — Patient Instructions (Addendum)
Today we updated your med list in our EPIC system...    Continue your current medications the same...  We will arrange for a new nebulizer machine...    Be sure to use the albuterol & Ipratropium 4 times daily...  Continue the Advair, Mucinex, Fluids, & all your other meds...  Call for any questions...  Let's plan another f/u visit in 2-3 months.Marland KitchenMarland Kitchen

## 2012-04-27 ENCOUNTER — Encounter: Payer: Self-pay | Admitting: Pulmonary Disease

## 2012-04-27 ENCOUNTER — Other Ambulatory Visit: Payer: Self-pay | Admitting: Dermatology

## 2012-04-27 MED ORDER — LEVOFLOXACIN 500 MG PO TABS: 500.0000 mg | ORAL_TABLET | Freq: Every day | ORAL | Status: DC

## 2012-04-27 NOTE — Telephone Encounter (Signed)
Called and spoke with the pt She is c/o prod cough with moderate brown sputum x 2-3 days She has slight SOB with coughing spells No CP, chest tightness, f/c/s, wheezing Requesting abx called in  Last ov 04/20/12 Next ov 07/27/12 Allergies  Allergen Reactions  . Allopurinol     REACTION: rash  . Codeine   . Hydrocodone     REACTION: hallucinations  . Morphine     REACTION: hallucinations

## 2012-05-01 ENCOUNTER — Telehealth: Payer: Self-pay | Admitting: Internal Medicine

## 2012-05-01 NOTE — Telephone Encounter (Signed)
Son called cc fighting bronchitis x 2 months with persistent cough wheeze and sob even on prednisone but comfortable at rest p neb  DDX of  difficult airways managment all start with A and  include Adherence, Ace Inhibitors, Acid Reflux, Active Sinus Disease, Alpha 1 Antitripsin deficiency, Anxiety masquerading as Airways dz,  ABPA,  allergy(esp in young), Aspiration (esp in elderly), Adverse effects of DPI,  Active smokers, plus two Bs  = Bronchiectasis and Beta blocker use..and one C= CHF  ? Is the acei related rec > d/c lisinopril  albut neb prn - f/u this coming week in office, go to er in meantime if not comfortable at rest

## 2012-05-02 ENCOUNTER — Telehealth: Payer: Self-pay | Admitting: Pulmonary Disease

## 2012-05-02 NOTE — Telephone Encounter (Signed)
Per SN---  Increase the pred 5 mg  To 2 po daily and cont the nebs qid, advair, mucinex etc.  i called and spoke with pt and she is aware to increase the pred to 2 daily and cont all other meds the same.  Pt voiced her understanding and will call back if this seems to not help her.  Nothing further is needed.

## 2012-05-02 NOTE — Telephone Encounter (Signed)
Spoke with son,states pt is still having sob,wheezing,productive cough. Has 2 more days left on her abx. Also  They have increased her nebs to 4 times a day, With no relief.  Allergies  Allergen Reactions  . Allopurinol     REACTION: rash  . Codeine   . Hydrocodone     REACTION: hallucinations  . Morphine     REACTION: hallucinations   Dr Kriste Basque please advise Thank you

## 2012-05-02 NOTE — Telephone Encounter (Signed)
Pt's son called back requesting to speak to nurse.  He can be reached @ [phone]. Andrea Rubio

## 2012-05-02 NOTE — Telephone Encounter (Signed)
I spoke with pt son and made him aware still awaiting response. He voiced his understanding

## 2012-05-03 ENCOUNTER — Telehealth: Payer: Self-pay | Admitting: Pulmonary Disease

## 2012-05-03 NOTE — Telephone Encounter (Signed)
Andrea Rubio, CMA at 05/02/2012 4:28 PM   Status: Signed            Per SN---  Increase the pred 5 mg To 2 po daily and cont the nebs qid, advair, mucinex etc. i called and spoke with pt and she is aware to increase the pred to 2 daily and cont all other meds the same. Pt voiced her understanding and will call back if this seems to not help her. Nothing further is needed.  ----  I spoke with pt and offered to schedule her appt. She stated she is going to continue with SN recs and see how she does. She will call back to schedule OV with TP if she worsens or does not get any better. Nothing further was needed

## 2012-05-05 ENCOUNTER — Ambulatory Visit (INDEPENDENT_AMBULATORY_CARE_PROVIDER_SITE_OTHER): Payer: Medicare PPO | Admitting: Adult Health

## 2012-05-05 ENCOUNTER — Encounter: Payer: Self-pay | Admitting: Adult Health

## 2012-05-05 VITALS — BP 128/80 | HR 90 | Temp 99.1°F | Ht 60.0 in | Wt 138.0 lb

## 2012-05-05 DIAGNOSIS — J209 Acute bronchitis, unspecified: Secondary | ICD-10-CM

## 2012-05-05 MED ORDER — LEVOFLOXACIN 500 MG PO TABS: 500.0000 mg | ORAL_TABLET | Freq: Every day | ORAL | Status: DC

## 2012-05-05 MED ORDER — PREDNISONE 10 MG PO TABS: ORAL_TABLET | ORAL | Status: DC

## 2012-05-05 NOTE — Patient Instructions (Addendum)
Extend Levaquin 500mg  daily for 3 days  Increase Prednisone 20mg  daily for 5 days then 10 mg daily for 5 days then 5mg  daily  Remain off Lisinopril.  Continue Losartan  Please contact office for sooner follow up if symptoms do not improve or worsen or seek emergency care  follow up Dr. Kriste Basque  As planned

## 2012-05-06 NOTE — Progress Notes (Signed)
Subjective:    Patient ID: Andrea Rubio, female    DOB: 27-May-1925, 79 y.o.   MRN: [id number]  HPI 79 y/o WF , never smoker.    ~05/06/2012 Acute OV  Complains of persistent cough .  Recently called in Levaquin for bronchitis . Cough was  improved, then worsened again 1 week ago w/ increased SOB, wheezing, tightness in chest, prod cough with brown/green mucus .  Increased prednisone to 10mg  yesterday.  finished abx yesterday. NO hemoptysis, chest pain or edema.  Lisinopril was stopped and started on Losartan. Tolerating well.    Problem List:  MACULAR DEGENERATION (ICD-362.50)  SINUSITIS, CHRONIC (ICD-473.9) - she is s/p bliat [facility] 1986 by DrKraus... ~  CT Sinus 11/09 showed chr sinusitis changes in the max & sphenoid regions... ~  8/12: she is c/o drippy nose, trial Atrovent nasal spray...  ASTHMATIC BRONCHITIS, ACUTE (ICD-466.0) - on PRED 5mg /d, ADVAIR 250Bid, & ALBUTEROL NEBS Tid... stable- min cough, w/o sputum, no change in DOE, etc... ~  PFT's 3/06 showed FVC= 1.49 (68%), FEV1= 1.12 (75%), %1sec=75, mid-flows= 43%pred... ~  [facility] 8/09 w/ bilat pneumonia... see DC Summary. ~  CXR 10/09 showed stable cardiomeg, tort Ao, clear lungs, bilat shoulder arthroplasties... ~  CT Angio Chest 11/09 showed neg for PE, fluid/ mucus plugs in depend LL bronchi... ~  CXR 10/10 showed stable cardiomeg, bilat humeral head prostheses, NAD.Marland Kitchen. ~  CXR 1/12 showed COPD, chr changes, ?right basilar opacity ==> subsequent films w/ incr edema. ~  CXR 5/12 showed cardiomeg, sm right effusion, no edema, bilat shoulder arthroplasties, NAD...  HYPERTENSION (ICD-401.9) - on LISINOPRIL 5mg /d, FUROSEMIDE 40mg /d & K20/d...  her BP's have stabilized at home and measures 112/68 here today... she denies CP, palpit, syncope, ch in edema...  AORTIC STENOSIS (ICD-424.1)   << followed by [id number] for Cards >> Hx of CHF (ICD-428.0)  ~  2DEcho 3/06 showed mild AS/AI w/ mod incr AoV thickness & reduced  leaflet excursion, mild MR, norm EF=55-65%...  ~  2DEcho 12/08 showed mod inc AoV thickness, mod reduced AV leaflet excursion, c/w mod AS, mild AI & mean grad= ;  norm LVF w/ EF= 65% and no wall motion abn etc... ~  2DEcho 1/12 in [place] showed severeAS & mildAI, mild conc LVH, norm sys func w/ EF=60-65%, HK at apex, grI DD... ~  EKG 1/12 showed NSR, poor R progression, NSSTTWA...  PERIPHERAL VASCULAR DISEASE (ICD-443.9) - on ASA 81mg /d... she can't walk enough to elicit discomfort, no rest pain or lesions... ~  ABI's 12/01 showed .88 on R, and .95 on L... ~  MRI showed small vessel dis & small infarct noted...  VENOUS INSUFFICIENCY (ICD-459.81) - on low sodium diet, Lasix, KCl...  HYPERLIPIDEMIA (ICD-272.4) - now on ZOCOR 40mg  per DrKadakia (prev on Pravachol, but stated intol to other Statins)... takes Fish Oil ~2000mg /d rec by her ophthalmologist... ~  FLP 9/05 ?on Prav40 showed TChol 176, TG 190, HDL 55, LDL 83... ~  FLP 4/10 on diet alone showed TChol 211, TG 153, HDL 44, LDL 128... she refuses med Rx- continue diet efforts. ~  1/12:  Hosp by [facility] & consulted DrKadakia who started her on ZOCOR40mg /d> he is checking her labs now...  HIATAL HERNIA (ICD-553.3) - on PRILOSEC 20mg /d & METACLOPRAMIDE 10mg Qhs... GERD (ICD-530.81) - EGD 3/06 by DrStark showed 3cmHH, mild esophagitis...  DIVERTICULOSIS, COLON (ICD-562.10) - last colonoscopy was 6/06 by [facility] showed divertics and ischemic colitis...  ~  Adm 1/12 by [facility] w/ Diverticulitis, treated &  improved, she has not followed up w/ GI; she take 1/2 capful of MIRALAX daily.  STRESS INCONTINENCE (ICD-788.39) - she has hx UTI's on & off... ~  10/10: urine c/s +EColi & Rx'd w/ Cipro.  DEGENERATIVE JOINT DISEASE (ICD-715.90) - she has severe DJD & Gout w/ deformities- Foot XRay showed Charcot foot deformity, severe degenerative dis, etc...  added TRAMADOL + TYLENOL Prn... GOUT, UNSPECIFIED (ICD-274.9) - labs 4/10 showed Uric Acid level= 7.9.Marland KitchenMarland Kitchen she  has hx of gout & hyperuricemia but allergic to Allopurinol w/ rash... therefore on ULORIC 40mg /d... Uric Acid level 5/11 on Uloric40 = 3.2 LOW BACK PAIN (ICD-724.2) NECK PAIN (ICD-723.1) - eval by Susann Givens ==> DrLewitt 2010 w/ MRI & Rx  w/ Lidoderm, Tylenol, Lyrica- now on NEURONTIN... OSTEOPOROSIS (ICD-733.00) - prev on Fosamax (stopped 1/12 in hosp "my kidneys can't handle it"); on Ca++, Vits, & Vit D... ~  labs 4/10 showed Vit D level = 28... rec> start Vit D OTC 1000 u daily. ~  labs 5/11 showed Vit D level = 32... rec incr Vit D to 2000 u daily.  ANXIETY (ICD-300.00) - off prev Alpraz Rx & takes AMITRIPTYLINE 25mg  [facility]... under stress w/ 57 y/o son had MI & stent in 2010.  Hx of ANEMIA (ICD-285.9) - hx anemia req 2U transfusion 6/06 hosp for ischemic colitis...  ~  Hg= 12 - 13 over 2008-9. ~  labs 8/09 hosp showed Hg= 14 ~  labs 4/10 showed Hg= 12.2 ~  labs 3/11 showed Hg= 12.5, MCV= 92, Fe= 22 (9%sat)... start FeSO4 Bid. ~  labs 5/11 showed = 13.0, MCV= 94, Fe= 57 (23%)... continue Fe supplement.  DERM>  She has skin cancer removed from the right side of her face by DrDanJones [phone])...   Past Surgical History  Procedure Laterality Date  . Lumbar laminectomy    . Shoulder surgery      bilateral    Outpatient Encounter Prescriptions as of 05/05/2012  Medication Sig Dispense Refill  . albuterol (PROVENTIL) (2.5 MG/3ML) 0.083% nebulizer solution Take 2.5 mg by nebulization 3 (three) times daily as needed.        Marland Kitchen amitriptyline (ELAVIL) 25 MG tablet TAKE ONE TABLET AT BEDTIME.  30 tablet  6  . aspirin 81 MG tablet Take 81 mg by mouth daily.        Marland Kitchen atorvastatin (LIPITOR) 20 MG tablet Take 1 tablet (20 mg total) by mouth daily.  30 tablet  PRN  . Azelastine-Fluticasone 137-50 MCG/ACT SUSP Place 1-2 sprays into the nose 2 (two) times daily as needed.  1 Bottle  5  . calcitonin, salmon, (MIACALCIN/FORTICAL) 200 UNIT/ACT nasal spray Place 1 spray into the nose daily.      .  Calcium Carbonate-Vitamin D (CALCIUM 600+D) 600-400 MG-UNIT per tablet Take 1 tablet by mouth daily.       . Cholecalciferol (VITAMIN D) 1000 UNITS capsule Take 1,000 Units by mouth daily.        . clotrimazole-betamethasone (LOTRISONE) cream APPLY TO RASH TWICE DAILY.  15 g  2  . conjugated estrogens (PREMARIN) vaginal cream daily.       . ferrous gluconate (FERGON) 325 MG tablet Take 325 mg by mouth daily with breakfast.       . Fluticasone-Salmeterol (ADVAIR DISKUS) 250-50 MCG/DOSE AEPB Inhale 1 puff into the lungs 2 (two) times daily.  60 each  11  . furosemide (LASIX) 40 MG tablet Take 1 tablet (40 mg total) by mouth daily.  30 tablet  11  .  Guaifenesin 1200 MG TB12 Take 1 tablet by mouth 2 (two) times daily.      Marland Kitchen ipratropium (ATROVENT) 0.06 % nasal spray USE 2 SPRAYS EACH NOSTRIL 4 TIMES A DAY.  15 mL  6  . ketoconazole (NIZORAL) 2 % shampoo Use as directed  120 mL  PRN  . losartan (COZAAR) 25 MG tablet Take 1 tablet (25 mg total) by mouth daily.  30 tablet  5  . metoCLOPramide (REGLAN) 10 MG tablet TAKE ONE TABLET AT BEDTIME.  30 tablet  5  . Multiple Vitamin (MULTIVITAMIN) capsule Take 1 capsule by mouth daily.        . potassium chloride SA (K-DUR,KLOR-CON) 20 MEQ tablet TAKE 1 TABLET ONCE DAILY.  30 tablet  5  . predniSONE (DELTASONE) 5 MG tablet Take 1-2 tabs by mouth once daily  60 tablet  5  . PRILOSEC OTC 20 MG tablet TAKE 1 TABLET ONCE DAILY.  42 tablet  6  . simvastatin (ZOCOR) 40 MG tablet Take 40 mg by mouth at bedtime.        Marland Kitchen ULORIC 40 MG tablet TAKE 1 TABLET EACH DAY.  30 tablet  6  . ULTRAM 50 MG tablet TAKE ONE TABLET EVERY 6 HOURS AS NEEDED FOR PAIN (MAY TAKE WITH TYLENOL).  90 each  5  . [DISCONTINUED] levofloxacin (LEVAQUIN) 500 MG tablet Take 1 tablet (500 mg total) by mouth daily.  7 tablet  0  . [DISCONTINUED] levofloxacin (LEVAQUIN) 500 MG tablet Take 1 tablet (500 mg total) by mouth daily.  7 tablet  0  . [DISCONTINUED] predniSONE (DELTASONE) 20 MG tablet Take 1  tablet twice day x 4 days,then 1 tablet daily x 4 days then 1/2 tab dail til gone  15 tablet  0  . levofloxacin (LEVAQUIN) 500 MG tablet Take 1 tablet (500 mg total) by mouth daily.  3 tablet  0  . predniSONE (DELTASONE) 10 MG tablet 2 tabs daily 5 days , 1 tab daily for 5 days then back to 5mg  daily  20 tablet  0  . [DISCONTINUED] omeprazole (PRILOSEC) 20 MG capsule Take 1 capsule by mouth daily.       No facility-administered encounter medications on file as of 05/05/2012.    Allergies  Allergen Reactions  . Allopurinol     REACTION: rash  . Codeine   . Hydrocodone     REACTION: hallucinations  . Morphine     REACTION: hallucinations    Review of Systems       Constitutional:   No  weight loss, night sweats,  Fevers, chills, + fatigue, or  lassitude.  HEENT:   No headaches,  Difficulty swallowing,  Tooth/dental problems, or  Sore throat,                No sneezing, itching, ear ache,  +nasal congestion, post nasal drip,   CV:  No chest pain,  Orthopnea, PND, swelling in lower extremities, anasarca, dizziness, palpitations, syncope.   GI  No heartburn, indigestion, abdominal pain, nausea, vomiting, diarrhea, change in bowel habits, loss of appetite, bloody stools.   Resp:   No coughing up of blood.    No chest wall deformity  Skin: no rash or lesions.  GU: no dysuria, change in color of urine, no urgency or frequency.  No flank pain, no hematuria   Psych:  No change in mood or affect. No depression or anxiety.  No memory loss.        Objective:  Physical Exam    79  y/o WF in NAD.  GENERAL:  Alert & oriented; pleasant & cooperative... HEENT:  [place]/AT, EACs-clear, TMs-wnl, NOSE-clear, THROAT-clear & wnl NECK:  Supple w/ decrROM; no JVD; normal carotid impulses w/o bruits; no thyromegaly or nodules palpated; no lymphadenopathy. CHEST: Few rhonchi  HEART:  Regular Rhythm;  gr 1-2/6 SEM without rubs or gallops detected... ABDOMEN:  Soft & nontender; normal bowel sounds;  no organomegaly or masses palpated... EXT: +hand deformities, w/ tophi & mod arthritic changes & contractures... right knee arthritis & severe foot deformities as well. no varicose veins/+venous insuffic/ tr edema. DERM:  no lesions- ecchymoses, thin skin, etc... Diffuse ecchymotic areas along forearms R>L    Assessment & Plan:

## 2012-05-06 NOTE — Assessment & Plan Note (Signed)
Slow to resolve flare  >declines cxr today   Plan  Extend Levaquin 500mg  daily for 3 days  Increase Prednisone 20mg  daily for 5 days then 10 mg daily for 5 days then 5mg  daily  Remain off Lisinopril.  Continue Losartan  Please contact office for sooner follow up if symptoms do not improve or worsen or seek emergency care  follow up Dr. Kriste Basque  As planned

## 2012-05-19 ENCOUNTER — Telehealth: Payer: Self-pay | Admitting: Pulmonary Disease

## 2012-05-19 MED ORDER — PREDNISONE 20 MG PO TABS: 20.0000 mg | ORAL_TABLET | Freq: Every day | ORAL | Status: DC

## 2012-05-19 NOTE — Telephone Encounter (Signed)
Pt son called and stated that since seeing TP her symptoms have not improved.  Pt has taken all but one prednisone pill left. Pt wants to know if she needs to come in and be seen or if her prednisone taper could be extended.  Pt c/o increased wheezing, cough and sob.   Seen 05/05/12 Extend Levaquin 500mg  daily for 3 days  Increase Prednisone 20mg  daily for 5 days then 10 mg daily for 5 days then 5mg  daily  Remain off Lisinopril.  Continue Losartan  Please contact office for sooner follow up if symptoms do not improve or worsen or seek emergency care  follow up Dr. Kriste Basque As planned   Allergies  Allergen Reactions  . Allopurinol     REACTION: rash  . Codeine   . Hydrocodone     REACTION: hallucinations  . Morphine     REACTION: hallucinations     Please advise Dr Kriste Basque. Thanks.

## 2012-05-19 NOTE — Telephone Encounter (Signed)
Per SN: Increase Pred to 20mg  qd  #30 x 0refills. Sent to pharmacy [facility]  Pt aware that Rx sent.  Appt needed next week for followup--scheduled for 05/24/12 at 230 with SN

## 2012-05-24 ENCOUNTER — Encounter: Payer: Self-pay | Admitting: Pulmonary Disease

## 2012-05-24 ENCOUNTER — Ambulatory Visit (INDEPENDENT_AMBULATORY_CARE_PROVIDER_SITE_OTHER): Payer: Medicare PPO | Admitting: Pulmonary Disease

## 2012-05-24 VITALS — BP 110/68 | HR 82 | Temp 98.4°F | Ht 60.0 in | Wt 132.6 lb

## 2012-05-24 DIAGNOSIS — I509 Heart failure, unspecified: Secondary | ICD-10-CM

## 2012-05-24 DIAGNOSIS — K219 Gastro-esophageal reflux disease without esophagitis: Secondary | ICD-10-CM

## 2012-05-24 DIAGNOSIS — E785 Hyperlipidemia, unspecified: Secondary | ICD-10-CM

## 2012-05-24 DIAGNOSIS — N39498 Other specified urinary incontinence: Secondary | ICD-10-CM

## 2012-05-24 DIAGNOSIS — I359 Nonrheumatic aortic valve disorder, unspecified: Secondary | ICD-10-CM

## 2012-05-24 DIAGNOSIS — I10 Essential (primary) hypertension: Secondary | ICD-10-CM

## 2012-05-24 DIAGNOSIS — I872 Venous insufficiency (chronic) (peripheral): Secondary | ICD-10-CM

## 2012-05-24 DIAGNOSIS — I739 Peripheral vascular disease, unspecified: Secondary | ICD-10-CM

## 2012-05-24 DIAGNOSIS — F411 Generalized anxiety disorder: Secondary | ICD-10-CM

## 2012-05-24 DIAGNOSIS — M1990 Unspecified osteoarthritis, unspecified site: Secondary | ICD-10-CM

## 2012-05-24 DIAGNOSIS — J209 Acute bronchitis, unspecified: Secondary | ICD-10-CM

## 2012-05-24 DIAGNOSIS — M810 Age-related osteoporosis without current pathological fracture: Secondary | ICD-10-CM

## 2012-05-24 MED ORDER — PREDNISONE 20 MG PO TABS: ORAL_TABLET | ORAL | Status: DC

## 2012-05-24 NOTE — Patient Instructions (Addendum)
Today we updated your med list in our EPIC system...    Continue your current medications the same...  We decided to slowly cut back on the Prednisone starting w/ "half a step" from 20mg  every day to 20mg  one day & 10mg  the next (alternate 20-10-20-10-etc)...  Marland KitchenCall for any questions...  Let's plan a follow up visit in 95mo, sooner if needed for problems.Marland KitchenMarland Kitchen

## 2012-05-24 NOTE — Progress Notes (Signed)
Subjective:    Patient ID: Andrea Rubio, female    DOB: 29-Jul-1925, 79 y.o.   MRN: [id number]  HPI 79 y/o WF here for a follow up visit... she has multiple medical problems as noted below...   ~  May 27, 2011:  495mo ROV & Andrea Rubio fell in her bathroom several weeks ago- hit the back of her head & left arm abrasion; EMS checked her & dressed her wound and she decided to see her local physician DrWElkins; he redressed the left arm abrasion & reassured her;  We rechecked her occipital area- sm scab ok to wash etc, and left arm abrasion healing nicely; She knows to be more careful to avoid falls etc... We reviewed her meds, problem list, & prev labs> see below>>    She tells me she saw [facility] for Cards f/u about a month ago> stable, no changes made (we don't get notes from him)...  ~  October 07, 2011:  495mo ROV     She saw Urology 7/13 for Estring change> this has been controlling her dysuria & is done every 28mo...    She is followed by Susann Givens for Ortho> L1 compression fx ~4/13; on Tramadol & Miacalcin NS, Vit D, & Calcium... We reviewed prob list, meds, xrays and labs> see below>> She requests refill of Nizoral shampoo- ok... LABS 9/13:  FLP- not at goals on Simva40, reminded to take everyday;  Chems- wnl;  CBC- wnl  ~  February 10, 2012:  495mo ROV & Andrea Rubio has had a refractory AB episode w/ persist cough & discolored mucus; now on Avelox & we reviewed the need to maximize her Rx w/ Pred 5mg /d, NEBS Tid, Advair250Bid, Mucinex-2Bid, Fluids, etc... BP controlled on Lisin + Lasix;  Cards followed by [id number];  She was started on Atorva20 but didn't bring her med bottles to review (as requested)...    We reviewed prob list, meds, xrays and labs> see below for updates >>   ~  March 14, 2012:  84mo ROV & Andrea Rubio has improved but not all the way resolved from her recent AB exac (see below); We reviewed the following medical problems during today's office visit >>     AB> she had AB exac last month &  Rx w/ Avelox, Pred, Nebs, Advair250, Mucinex, etc; she is improved but not back to baseline & we are forced to bump Pred to 20mg  tabs- 4d taper + NEBS Qid, max Mucinex, etc...    HBP> now on Cozaar25, Lasix40, K20; BP=122/82 & she denies CP, palpit, ch in SOB, edema, etc...    Cardiac> severeAS & CHF> followed by [id number], meds reviewed, we don't have notes from him...    Hyperlipid> states she is off the Lip20; she did not bring med list or bottles to review; asked to have Cards send notes to me & f/u FLP off meds...    HH, GERD, Divertics> on Prilosec20 & Metaclop10qhs; sister died w/ colon ca- pts last colonoscopy 6/06 by [facility] w/o polyps (+divertics & ischemic colitis)...    DJD, Gout, LBP> on Uloric40 & Tramadol50 prn; she has severe deformities & difficulty getting up & about; she desperately needs more PT but she refuses...    Osteoporosis> on Miacalcin NS;     Anxiety> she takes [id number] but declines anxiolytic rx... We reviewed prob list, meds, xrays and labs> see below for updates >>  LABS 2/14:  Chems- wnl;  CBC- wnl;  BNP= 427...   ~  April 20, 2012:  79mo ROV & Andrea Rubio has stabilized by bumping up Pred, now weaned back down to 5mg /d plus her max regimen... She is managing as best she can w/ help of her son at home; notes some pain in her left hip- known osteoporosis & BMD 3/14 showed TScores +0.7 in Spine, and -2.3 in left Fem Neck, and -1.9 in Forearm radius; she's been on the Miacalcin NS "it seems to be helping" she says, therefore continue the same for now...  BP stable, denies CP, palpit, ch in SOB etc... She has severe AS followed by [facility] but apparently there is not much he can do... We reviewed prob list, meds, xrays and labs> see below for updates >> we will recheck her in 2-3 months...  ~  May 24, 2012:  79mo ROV & in the interval Andrea Rubio had yet another exac characterized by incr cough, yellow sput, congestion, wheezing & SOB; we called in Levaquin, then had to bump the  Pred back up (first to 10mg /d, then to 20mg /d);  She reports that she is a lot better now on the Pred20- "I'm better than I've been the whole time"- and we discussed a SLOW wean to try to determine her threshold dose> for now decr to 20 alt w/ 10mg  Qod & f/u 79mo...  She will also continue on her Miacalcin NS, Calcium, MVI, VitD 1000u/d...    We reviewed prob list, meds, xrays and labs> see below for updates >>           Problem List:    MACULAR DEGENERATION (ICD-362.50) - she is legally blind & son helps out at home...  SINUSITIS, CHRONIC (ICD-473.9) - she is s/p bliat [facility] 1986 by DrKraus... ~  CT Sinus 11/09 showed chr sinusitis changes in the max & sphenoid regions... ~  8/12: she is c/o drippy nose, trial Atrovent nasal spray...  ASTHMATIC BRONCHITIS, ACUTE (ICD-466.0) - on PRED 5mg /d, ADVAIR 250Bid, & ALBUTEROL NEBS Tid... stable- min cough, w/o sputum, no change in DOE, etc... ~  PFT's 3/06 showed FVC= 1.49 (68%), FEV1= 1.12 (75%), %1sec=75, mid-flows= 43%pred... ~  [facility] 8/09 w/ bilat pneumonia... see DC Summary. ~  CXR 10/09 showed stable cardiomeg, tort Ao, clear lungs, bilat shoulder arthroplasties... ~  CT Angio Chest 11/09 showed neg for PE, fluid/ mucus plugs in depend LL bronchi... ~  CXR 10/10 showed stable cardiomeg, bilat humeral head prostheses, NAD.Marland Kitchen. ~  CXR 1/12 showed COPD, chr changes, ?right basilar opacity ==> subsequent films w/ incr edema. ~  CXR 5/12 showed cardiomeg, sm right effusion, no edema, bilat shoulder arthroplasties, NAD... ~  10/12:  AB exac treated by TP w/ Augmentin & improved... ~  1/14:  AB exac treated w/ Avelox, Pred taper, NEBS qid, Advair250, Mucinex, etc... ~  3/14: back on Pred 5mg /d and stable once again=> then had yet another exac requiring Rx w/ Levaquin, incr Pred to 20mg /d... ~  4/14:  Much improved on Pred20/d & we decided to decr to 20-10 Qod & recheck 79mo...  HYPERTENSION (ICD-401.9) >>  ~  8/12: her BP's have stabilized  at home and measures 112/68 here today... she denies CP, palpit, syncope, ch in edema... ~  12/12:on Lisin5/d, Lasix40/d & K20/d; BP= 126/72 & clinically stable w/o CP, palpit, ch in SOB, etc... ~  4/13: on Lisin5/d, Lasix40/d & K20/d; BP= 108/62 & usually sl better at home; still denies any CP, palpit, ch in SOB, etc... ~  2/14: now on Cozaar25, Lasix40, K20; BP=122/82 & she denies CP,  palpit, ch in SOB, edema, etc  AORTIC STENOSIS (ICD-424.1)   << followed by HYQMVHQIO for Cards >> Hx of CHF (ICD-428.0)  ~  2DEcho 3/06 showed mild AS/AI w/ mod incr AoV thickness & reduced leaflet excursion, mild MR, norm EF=55-65%...  ~  2DEcho 12/08 showed mod inc AoV thickness, mod reduced AV leaflet excursion, c/w mod AS, mild AI & mean grad= ;  norm LVF w/ EF= 65% and no wall motion abn etc... ~  2DEcho 1/12 in [place] showed severeAS & mildAI, mild conc LVH, norm sys func w/ EF=60-65%, HK at apex, grI DD... ~  EKG 1/12 showed NSR, poor R progression, NSSTTWA... ~  We do not have follow up notes from [facility], pt indicates no change in meds...  PERIPHERAL VASCULAR DISEASE (ICD-443.9) - on ASA 81mg /d... she can't walk enough to elicit discomfort, no rest pain or lesions... ~  ABI's 12/01 showed .88 on R, and .95 on L... ~  MRI showed small vessel dis & small infarct noted...  VENOUS INSUFFICIENCY (ICD-459.81) - on low sodium diet, Lasix, KCl...  HYPERLIPIDEMIA (ICD-272.4) - prev on Zocor 40mg  per [id number]- off now;  (prev on Pravachol, but stated intol to other Statins)... takes Fish Oil ~2000mg /d rec by her ophthalmologist... ~  FLP 9/05 ?on Prav40 showed TChol 176, TG 190, HDL 55, LDL 83... ~  FLP 4/10 on diet alone showed TChol 211, TG 153, HDL 44, LDL 128... she refuses med Rx- continue diet efforts. ~  1/12:  Hosp by [facility] & consulted DrKadakia who started her on ZOCOR40mg /d> he is checking her labs now... ~  12/12: she crossed Simva40 off her list ?stopped on her own or ?stopped by [place]... ~   2013:  She still has Simva40 on her list here but NEVER brings a list from [place] or her med bottles to review... ~  9/13:  FLP here showed TChol 206, TG 167, HDL 48, LDL 133... She has agreed to try ATORVASTATIN 20mg /d... ~  2/14: she reports that she is off all statin meds...  HIATAL HERNIA (ICD-553.3) - on PRILOSEC 20mg /d & METACLOPRAMIDE 10mg Qhs... GERD (ICD-530.81) - EGD 3/06 by DrStark showed 3cmHH, mild esophagitis...  DIVERTICULOSIS, COLON (ICD-562.10) - last colonoscopy was 6/06 by [facility] showed divertics and ischemic colitis...  ~  Adm 1/12 by [facility] w/ Diverticulitis, treated & improved, she has not followed up w/ GI; she take 1/2 capful of MIRALAX daily.  STRESS INCONTINENCE (ICD-788.39) - she has hx UTI's & dysuria on & off... ~  10/10: urine c/s +EColi & Rx'd w/ Cipro. ~  7/12: she reports vaginal estrogen per Urology has helped her urinary symptoms; note from DrOttelin reviewed. ~  Labs 12/12 w/ normal renal function & Creat=0.7 ~  10/13: she had f/u DrOttelin> Hx trophic vaginitis, dysuria, & recurrent UTIs; rec topical estrogen=> estring (he replaced it that day); PVR=100; he rec ROV in 39mo for estring replacement. ~  2/14: f/u post menopausal atrophic vaginitis, chr cystitis, on Estring but cannot replace it herself- seen Q72mo for this purpose...  DEGENERATIVE JOINT DISEASE (ICD-715.90) - she has severe DJD & Gout w/ deformities- Foot XRay showed Charcot foot deformity, severe degenerative dis, etc...  added TRAMADOL + TYLENOL Prn... GOUT, UNSPECIFIED (ICD-274.9) - labs 4/10 showed Uric Acid level= 7.9.Marland KitchenMarland Kitchen she has hx of gout & hyperuricemia but allergic to Allopurinol w/ rash... therefore on ULORIC 40mg /d... Uric Acid level 5/11 on Uloric40 = 3.2 LOW BACK PAIN (ICD-724.2) NECK PAIN (ICD-723.1) - eval by Susann Givens ==> DrLewitt  2010 w/ MRI & Rx  w/ Lidoderm, Tylenol, Lyrica- now on NEURONTIN... OSTEOPOROSIS (ICD-733.00) - prev on Fosamax (stopped 1/12 in hosp "my kidneys can't handle  it"); on Ca++, Vits, & Vit D... ~  labs 4/10 showed Vit D level = 28... rec> start Vit D OTC 1000 u daily. ~  labs 5/11 showed Vit D level = 32... rec incr Vit D to 2000 u daily. ~  2013:  She fell w/ L1 compression & managed by Susann Givens w/ MIACALCIN NS & Tramadol... ~  3/14:  BMD here showed TScores +0.7 in Spine, and -2.3 in left Fem Neck, and -1.9 in Forearm radius; she feels the Miacalcin is helping, therefore continue same...  ANXIETY (ICD-300.00) - off prev Alpraz Rx & takes AMITRIPTYLINE 25mg  Qhs... under stress w/ 61 y/o son had MI & stent in 2010.  Hx of ANEMIA (ICD-285.9) - hx anemia req 2U transfusion 6/06 hosp for ischemic colitis...  ~  Hg= 12 - 13 over 2008-9. ~  labs 8/09 hosp showed Hg= 14 ~  labs 4/10 showed Hg= 12.2 ~  labs 3/11 showed Hg= 12.5, MCV= 92, Fe= 22 (9%sat)... start FeSO4 Bid. ~  labs 5/11 showed = 13.0, MCV= 94, Fe= 57 (23%)... continue Fe supplement. ~  Labs 12/12 showed Hg= 13.1, Fe= 35, (14%sat)... rec to continue Fe+VitC Bid... ~  Labs 9/13 showed Hg= 13.7, MCV=94  DERM>  She has skin cancer removed from the right side of her face by DrDanJones (Spring2012)...   Past Surgical History  Procedure Laterality Date  . Lumbar laminectomy    . Shoulder surgery      bilateral    Outpatient Encounter Prescriptions as of 05/24/2012  Medication Sig Dispense Refill  . albuterol (PROVENTIL) (2.5 MG/3ML) 0.083% nebulizer solution Take 2.5 mg by nebulization 4 (four) times daily.       Marland Kitchen amitriptyline (ELAVIL) 25 MG tablet TAKE ONE TABLET AT BEDTIME.  30 tablet  6  . aspirin 81 MG tablet Take 81 mg by mouth daily.        Marland Kitchen atorvastatin (LIPITOR) 20 MG tablet Take 1 tablet (20 mg total) by mouth daily.  30 tablet  PRN  . Azelastine-Fluticasone 137-50 MCG/ACT SUSP Place 1-2 sprays into the nose 2 (two) times daily as needed.  1 Bottle  5  . calcitonin, salmon, (MIACALCIN/FORTICAL) 200 UNIT/ACT nasal spray Place 1 spray into the nose daily.      . Calcium  Carbonate-Vitamin D (CALCIUM 600+D) 600-400 MG-UNIT per tablet Take 1 tablet by mouth daily.       . Cholecalciferol (VITAMIN D) 1000 UNITS capsule Take 1,000 Units by mouth daily.        . clotrimazole-betamethasone (LOTRISONE) cream APPLY TO RASH TWICE DAILY.  15 g  2  . conjugated estrogens (PREMARIN) vaginal cream daily.       . ferrous gluconate (FERGON) 325 MG tablet Take 325 mg by mouth daily with breakfast.       . Fluticasone-Salmeterol (ADVAIR DISKUS) 250-50 MCG/DOSE AEPB Inhale 1 puff into the lungs 2 (two) times daily.  60 each  11  . furosemide (LASIX) 40 MG tablet Take 1 tablet (40 mg total) by mouth daily.  30 tablet  11  . Guaifenesin 1200 MG TB12 Take 1 tablet by mouth 2 (two) times daily.      Marland Kitchen ipratropium (ATROVENT) 0.06 % nasal spray USE 2 SPRAYS EACH NOSTRIL 4 TIMES A DAY.  15 mL  6  . ketoconazole (NIZORAL) 2 %  shampoo Use as directed  120 mL  PRN  . losartan (COZAAR) 25 MG tablet Take 1 tablet (25 mg total) by mouth daily.  30 tablet  5  . metoCLOPramide (REGLAN) 10 MG tablet TAKE ONE TABLET AT BEDTIME.  30 tablet  5  . Multiple Vitamin (MULTIVITAMIN) capsule Take 1 capsule by mouth daily.        . potassium chloride SA (K-DUR,KLOR-CON) 20 MEQ tablet TAKE 1 TABLET ONCE DAILY.  30 tablet  5  . predniSONE (DELTASONE) 20 MG tablet Take 1 tablet (20 mg total) by mouth daily.  30 tablet  0  . PRILOSEC OTC 20 MG tablet TAKE 1 TABLET ONCE DAILY.  42 tablet  6  . simvastatin (ZOCOR) 40 MG tablet Take 40 mg by mouth at bedtime.        Marland Kitchen ULORIC 40 MG tablet TAKE 1 TABLET EACH DAY.  30 tablet  6  . ULTRAM 50 MG tablet TAKE ONE TABLET EVERY 6 HOURS AS NEEDED FOR PAIN (MAY TAKE WITH TYLENOL).  90 each  5  . predniSONE (DELTASONE) 10 MG tablet 2 tabs daily 5 days , 1 tab daily for 5 days then back to 5mg  daily  20 tablet  0  . predniSONE (DELTASONE) 5 MG tablet Take 1-2 tabs by mouth once daily  60 tablet  5  . [DISCONTINUED] levofloxacin (LEVAQUIN) 500 MG tablet Take 1 tablet (500 mg  total) by mouth daily.  3 tablet  0   No facility-administered encounter medications on file as of 05/24/2012.    Allergies  Allergen Reactions  . Allopurinol     REACTION: rash  . Codeine   . Hydrocodone     REACTION: hallucinations  . Morphine     REACTION: hallucinations    Current Medications, Allergies, Past Medical History, Past Surgical History, Family History, and Social History were reviewed in [employer] record.    Review of Systems        See HPI - all other systems neg except as noted... The patient complains of dyspnea on exertion, peripheral edema, abdominal pain, severe indigestion/heartburn, muscle weakness, and difficulty walking.  The patient denies anorexia, fever, weight loss, weight gain, vision loss, decreased hearing, hoarseness, chest pain, syncope, prolonged cough, headaches, hemoptysis, melena, hematochezia, hematuria, incontinence, suspicious skin lesions, transient blindness, depression, unusual weight change, abnormal bleeding, enlarged lymph nodes, and angioedema.     Objective:   Physical Exam    WD, Petite, sl overweight, 79 y/o WF in NAD... she is <5' tall, weight 140# GENERAL:  Alert & oriented; pleasant & cooperative... HEENT:  [place]/AT, EACs-clear, TMs-wnl, NOSE-clear, THROAT-clear & wnl... known macular degen per ophthal... NECK:  Supple w/ decrROM; no JVD; normal carotid impulses w/o bruits; no thyromegaly or nodules palpated; no lymphadenopathy. CHEST:  Decr BS bilat, Coarse BS, no wheezing or rhonchi... HEART:  Regular Rhythm;  gr 1-2/6 SEM without rubs or gallops detected... ABDOMEN:  Soft & nontender; normal bowel sounds; no organomegaly or masses palpated... EXT: +hand deformities, w/ tophi & mod arthritic changes & contractures... right knee arthritis & severe foot deformities as well. no varicose veins/+venous insuffic/ tr edema. NEURO:  CN's intact x reduced vision... diffusely weak, without focal changes... +gait  abn... DERM:  Abrasion over occiput & on left arm; +ecchymoses, thin skin, etc...  RADIOLOGY DATA:  Reviewed in the EPIC EMR & discussed w/ the patient...  LABORATORY DATA:  Reviewed in the EPIC EMR & discussed w/ the patient.Marland KitchenMarland Kitchen  Assessment & Plan:    AB>  We discussed the need to avoid infections & maximize her home resp treatments; now much improved on Pred20 7 we need to determine her threshold dose- Rec decr to 20-10 Qod & f/u 43mo...  HBP & Diastolic CHF>  Controlled on meds- Losatan & Lasix; continue same; she will f/u w/ [id number] as planned.  AS>  Followed by WUJWJXBJY for her severe AS, not a surg candidate...  HYPERLIPID>  Off Simva40 now ?per Cards or on her own; she will check w/ DrKadakia==> we don't have notes, apparently off all meds, she agrees to try ATORVASTATIN20=> stopped on her own...  GI>  Hx HH, GERD, Divertics, Colitis>  On Prilosec, Reglan 10mg  Qhs (she does not want to stop this med), & Miralax as noted...  GU>  Dysuria improved w/ vaginal cream (no estring) & it is changed Q35mo...  DJD/ Gout/ Neck&Back Pain>  As noted- on Tramadol, Tylenol, Uloric; she is weak in the legs, hard to stand & walk, I'm afraid she will become immobile, unable to stand or walk making self care difficult but she is unperturbed by this & refuses my rec for more home PT...  Osteoporosis>  BMD w/ TScore -2.3 in [facility]; she is on Miacalcin NS & feels this is helping- continue same...  Anxiety>  She has Amitriptyline 25Qhs, not on anxiolytic...   Patient's Medications  New Prescriptions   No medications on file  Previous Medications   ALBUTEROL (PROVENTIL) (2.5 MG/3ML) 0.083% NEBULIZER SOLUTION    Take 2.5 mg by nebulization 4 (four) times daily.    AMITRIPTYLINE (ELAVIL) 25 MG TABLET    TAKE ONE TABLET AT BEDTIME.   ASPIRIN 81 MG TABLET    Take 81 mg by mouth daily.     ATORVASTATIN (LIPITOR) 20 MG TABLET    Take 1 tablet (20 mg total) by mouth daily.   AZELASTINE-FLUTICASONE  137-50 MCG/ACT SUSP    Place 1-2 sprays into the nose 2 (two) times daily as needed.   CALCITONIN, SALMON, (MIACALCIN/FORTICAL) 200 UNIT/ACT NASAL SPRAY    Place 1 spray into the nose daily.   CALCIUM CARBONATE-VITAMIN D (CALCIUM 600+D) 600-400 MG-UNIT PER TABLET    Take 1 tablet by mouth daily.    CHOLECALCIFEROL (VITAMIN D) 1000 UNITS CAPSULE    Take 1,000 Units by mouth daily.     CLOTRIMAZOLE-BETAMETHASONE (LOTRISONE) CREAM    APPLY TO RASH TWICE DAILY.   CONJUGATED ESTROGENS (PREMARIN) VAGINAL CREAM    daily.    FERROUS GLUCONATE (FERGON) 325 MG TABLET    Take 325 mg by mouth daily with breakfast.    FLUTICASONE-SALMETEROL (ADVAIR DISKUS) 250-50 MCG/DOSE AEPB    Inhale 1 puff into the lungs 2 (two) times daily.   FUROSEMIDE (LASIX) 40 MG TABLET    Take 1 tablet (40 mg total) by mouth daily.   GUAIFENESIN 1200 MG TB12    Take 1 tablet by mouth 2 (two) times daily.   IPRATROPIUM (ATROVENT) 0.06 % NASAL SPRAY    USE 2 SPRAYS EACH NOSTRIL 4 TIMES A DAY.   KETOCONAZOLE (NIZORAL) 2 % SHAMPOO    Use as directed   LOSARTAN (COZAAR) 25 MG TABLET    Take 1 tablet (25 mg total) by mouth daily.   METOCLOPRAMIDE (REGLAN) 10 MG TABLET    TAKE ONE TABLET AT BEDTIME.   MULTIPLE VITAMIN (MULTIVITAMIN) CAPSULE    Take 1 capsule by mouth daily.     POTASSIUM CHLORIDE SA (K-DUR,KLOR-CON) 20 MEQ  TABLET    TAKE 1 TABLET ONCE DAILY.   PRILOSEC OTC 20 MG TABLET    TAKE 1 TABLET ONCE DAILY.   SIMVASTATIN (ZOCOR) 40 MG TABLET    Take 40 mg by mouth at bedtime.     ULORIC 40 MG TABLET    TAKE 1 TABLET EACH DAY.   ULTRAM 50 MG TABLET    TAKE ONE TABLET EVERY 6 HOURS AS NEEDED FOR PAIN (MAY TAKE WITH TYLENOL).  Modified Medications   Modified Medication Previous Medication   PREDNISONE (DELTASONE) 20 MG TABLET predniSONE (DELTASONE) 20 MG tablet      Take as directed    Take 1 tablet (20 mg total) by mouth daily.  Discontinued Medications   LEVOFLOXACIN (LEVAQUIN) 500 MG TABLET    Take 1 tablet (500 mg total) by  mouth daily.   PREDNISONE (DELTASONE) 10 MG TABLET    2 tabs daily 5 days , 1 tab daily for 5 days then back to 5mg  daily   PREDNISONE (DELTASONE) 5 MG TABLET    Take 1-2 tabs by mouth once daily

## 2012-06-08 ENCOUNTER — Ambulatory Visit: Payer: Medicare PPO | Admitting: Pulmonary Disease

## 2012-06-08 ENCOUNTER — Other Ambulatory Visit: Payer: Self-pay | Admitting: Pulmonary Disease

## 2012-06-09 ENCOUNTER — Telehealth: Payer: Self-pay | Admitting: Pulmonary Disease

## 2012-06-09 NOTE — Telephone Encounter (Signed)
Called and spoke with [place] [facility] and the lasix and the reglan were both sent in on 5/7 but they only received the lasix rx.  Given the rx over the phone to the pharmacy.  Called and spoke with the pt and she is aware that they are getting the rx ready for her. Nothing further is needed.

## 2012-06-12 ENCOUNTER — Encounter: Payer: Self-pay | Admitting: Pulmonary Disease

## 2012-06-13 ENCOUNTER — Telehealth: Payer: Self-pay | Admitting: Pulmonary Disease

## 2012-06-13 NOTE — Telephone Encounter (Signed)
I spoke with pt and she stated her upper lip has been turning blue in color. She stated her daughter noticed this. She stated she can't check her O2 at home but every time she goes to the doctor it checks out fine. Pt has appt shceudled to see tp tomorrow.

## 2012-06-13 NOTE — Telephone Encounter (Signed)
Called spoke with pt's son Andrea Rubio who reports that: Lips are turning blue > noticed this yesterday Wheezing Coughing w/ dark/light green mucus SOB is no more increased than usual Primary care pt of SN but pt has asthmatic bronchitis Advised [name] that pt needs to be evaluated NOW Go to Urgent Care or ER Andrea Rubio stated he will call [name] (that office is in close proximity to pt's home) to see if she can be seen Asked Andrea Rubio what type of physician [name] and Andrea Rubio and patient (in the back-ground) stated that SN "knows who he is" Patient stated (in the back ground) that she is not bad enough to have 911 called [name] to call [name], then [facility]  ATC pt x3 to check on pt - line busy x3  Called spoke with [name]  Pt refuses to go to the UC or ER d/t "too many germs floating around" Wants to be seen this afternoon or tomorrow "she's not that bad" Ov w/ TP tomorrow morning in [place] @ 10am [name] to aware to call 911 if symptoms worsen overnight Will sign and forward to [facility]

## 2012-06-14 ENCOUNTER — Other Ambulatory Visit: Payer: Self-pay | Admitting: Adult Health

## 2012-06-14 ENCOUNTER — Ambulatory Visit: Payer: Medicare PPO | Admitting: Adult Health

## 2012-06-14 ENCOUNTER — Encounter: Payer: Self-pay | Admitting: Adult Health

## 2012-06-14 ENCOUNTER — Ambulatory Visit (INDEPENDENT_AMBULATORY_CARE_PROVIDER_SITE_OTHER): Payer: Medicare PPO | Admitting: Adult Health

## 2012-06-14 VITALS — BP 118/60 | HR 97 | Temp 98.2°F | Ht 60.0 in | Wt 131.0 lb

## 2012-06-14 DIAGNOSIS — D649 Anemia, unspecified: Secondary | ICD-10-CM

## 2012-06-14 DIAGNOSIS — I10 Essential (primary) hypertension: Secondary | ICD-10-CM

## 2012-06-14 DIAGNOSIS — J209 Acute bronchitis, unspecified: Secondary | ICD-10-CM

## 2012-06-14 LAB — CBC WITH DIFFERENTIAL/PLATELET
Hemoglobin: 13.4 g/dL (ref 12.0–15.0)
Lymphs Abs: 0.7 10*3/uL (ref 0.7–4.0)
Monocytes Relative: 3 % (ref 3–12)
Neutro Abs: 12.7 10*3/uL — ABNORMAL HIGH (ref 1.7–7.7)
Neutrophils Relative %: 92 % — ABNORMAL HIGH (ref 43–77)
RBC: 4.43 MIL/uL (ref 3.87–5.11)

## 2012-06-14 LAB — BASIC METABOLIC PANEL
Calcium: 9.1 mg/dL (ref 8.4–10.5)
Creat: 0.71 mg/dL (ref 0.50–1.10)

## 2012-06-14 NOTE — Patient Instructions (Addendum)
I will call with lab results  Mucinex DM Twice daily  As needed  Cough/congestion Fluids and rest  Please contact office for sooner follow up if symptoms do not improve or worsen or seek emergency care  follow up Dr. Kriste Basque  As planned and As needed

## 2012-06-14 NOTE — Progress Notes (Signed)
Subjective:    Patient ID: Andrea Rubio, female    DOB: 28-Feb-1925, 79 y.o.   MRN: [id number]  HPI 79 y/o WF , never smoker, Hx of asthmatic bronchitis/sinusitis and severe DJD /Gout -steroid dependent.   ~06/14/2012 Acute OV  Complains of that family noticed a bluish discoloration along her face/lips/chin over last few days.  She denies rash, pain, visual/speech changes, n/v or dysphagia.  Breathing is returning to her baseline without flare of cough or wheezing.  O2 sats are 97% on O2 today in office.  No recent injury or facial trauma.   Had recent slow to resolve asthmatic bronchitis now resolving , feeling better w/ less cough and dyspnea.  She is tapering down on steroids , currently on prednisone 20mg  alternating with 10mg  daily .   Problem List:  MACULAR DEGENERATION (ICD-362.50)  SINUSITIS, CHRONIC (ICD-473.9) - she is s/p bliat [facility] 1986 by DrKraus... ~  CT Sinus 11/09 showed chr sinusitis changes in the max & sphenoid regions... ~  8/12: she is c/o drippy nose, trial Atrovent nasal spray...  ASTHMATIC BRONCHITIS, ACUTE (ICD-466.0) - on PRED 5mg /d, ADVAIR 250Bid, & ALBUTEROL NEBS Tid... stable- min cough, w/o sputum, no change in DOE, etc... ~  PFT's 3/06 showed FVC= 1.49 (68%), FEV1= 1.12 (75%), %1sec=75, mid-flows= 43%pred... ~  [facility] 8/09 w/ bilat pneumonia... see DC Summary. ~  CXR 10/09 showed stable cardiomeg, tort Ao, clear lungs, bilat shoulder arthroplasties... ~  CT Angio Chest 11/09 showed neg for PE, fluid/ mucus plugs in depend LL bronchi... ~  CXR 10/10 showed stable cardiomeg, bilat humeral head prostheses, NAD.Marland Kitchen. ~  CXR 1/12 showed COPD, chr changes, ?right basilar opacity ==> subsequent films w/ incr edema. ~  CXR 5/12 showed cardiomeg, sm right effusion, no edema, bilat shoulder arthroplasties, NAD...  HYPERTENSION (ICD-401.9) - on LISINOPRIL 5mg /d, FUROSEMIDE 40mg /d & K20/d...  her BP's have stabilized at home and measures 112/68 here  today... she denies CP, palpit, syncope, ch in edema...  AORTIC STENOSIS (ICD-424.1)   << followed by [id number] for Cards >> Hx of CHF (ICD-428.0)  ~  2DEcho 3/06 showed mild AS/AI w/ mod incr AoV thickness & reduced leaflet excursion, mild MR, norm EF=55-65%...  ~  2DEcho 12/08 showed mod inc AoV thickness, mod reduced AV leaflet excursion, c/w mod AS, mild AI & mean grad= ;  norm LVF w/ EF= 65% and no wall motion abn etc... ~  2DEcho 1/12 in [place] showed severeAS & mildAI, mild conc LVH, norm sys func w/ EF=60-65%, HK at apex, grI DD... ~  EKG 1/12 showed NSR, poor R progression, NSSTTWA...  PERIPHERAL VASCULAR DISEASE (ICD-443.9) - on ASA 81mg /d... she can't walk enough to elicit discomfort, no rest pain or lesions... ~  ABI's 12/01 showed .88 on R, and .95 on L... ~  MRI showed small vessel dis & small infarct noted...  VENOUS INSUFFICIENCY (ICD-459.81) - on low sodium diet, Lasix, KCl...  HYPERLIPIDEMIA (ICD-272.4) - now on ZOCOR 40mg  per DrKadakia (prev on Pravachol, but stated intol to other Statins)... takes Fish Oil ~2000mg /d rec by her ophthalmologist... ~  FLP 9/05 ?on Prav40 showed TChol 176, TG 190, HDL 55, LDL 83... ~  FLP 4/10 on diet alone showed TChol 211, TG 153, HDL 44, LDL 128... she refuses med Rx- continue diet efforts. ~  1/12:  Hosp by [facility] & consulted DrKadakia who started her on ZOCOR40mg /d> he is checking her labs now...  HIATAL HERNIA (ICD-553.3) - on PRILOSEC 20mg /d & METACLOPRAMIDE 10mg Qhs.Marland KitchenMarland Kitchen  GERD (ICD-530.81) - EGD 3/06 by DrStark showed 3cmHH, mild esophagitis...  DIVERTICULOSIS, COLON (ICD-562.10) - last colonoscopy was 6/06 by [facility] showed divertics and ischemic colitis...  ~  Adm 1/12 by [facility] w/ Diverticulitis, treated & improved, she has not followed up w/ GI; she take 1/2 capful of MIRALAX daily.  STRESS INCONTINENCE (ICD-788.39) - she has hx UTI's on & off... ~  10/10: urine c/s +EColi & Rx'd w/ Cipro.  DEGENERATIVE JOINT DISEASE (ICD-715.90) - she  has severe DJD & Gout w/ deformities- Foot XRay showed Charcot foot deformity, severe degenerative dis, etc...  added TRAMADOL + TYLENOL Prn... GOUT, UNSPECIFIED (ICD-274.9) - labs 4/10 showed Uric Acid level= 7.9.Marland KitchenMarland Kitchen she has hx of gout & hyperuricemia but allergic to Allopurinol w/ rash... therefore on ULORIC 40mg /d... Uric Acid level 5/11 on Uloric40 = 3.2 LOW BACK PAIN (ICD-724.2) NECK PAIN (ICD-723.1) - eval by Susann Givens ==> DrLewitt 2010 w/ MRI & Rx  w/ Lidoderm, Tylenol, Lyrica- now on NEURONTIN... OSTEOPOROSIS (ICD-733.00) - prev on Fosamax (stopped 1/12 in hosp "my kidneys can't handle it"); on Ca++, Vits, & Vit D... ~  labs 4/10 showed Vit D level = 28... rec> start Vit D OTC 1000 u daily. ~  labs 5/11 showed Vit D level = 32... rec incr Vit D to 2000 u daily.  ANXIETY (ICD-300.00) - off prev Alpraz Rx & takes AMITRIPTYLINE 25mg  [facility]... under stress w/ 46 y/o son had MI & stent in 2010.  Hx of ANEMIA (ICD-285.9) - hx anemia req 2U transfusion 6/06 hosp for ischemic colitis...  ~  Hg= 12 - 13 over 2008-9. ~  labs 8/09 hosp showed Hg= 14 ~  labs 4/10 showed Hg= 12.2 ~  labs 3/11 showed Hg= 12.5, MCV= 92, Fe= 22 (9%sat)... start FeSO4 Bid. ~  labs 5/11 showed = 13.0, MCV= 94, Fe= 57 (23%)... continue Fe supplement.  DERM>  She has skin cancer removed from the right side of her face by DrDanJones [phone])...   Past Surgical History  Procedure Laterality Date  . Lumbar laminectomy    . Shoulder surgery      bilateral    Outpatient Encounter Prescriptions as of 06/14/2012  Medication Sig Dispense Refill  . albuterol (PROVENTIL) (2.5 MG/3ML) 0.083% nebulizer solution Take 2.5 mg by nebulization 4 (four) times daily.       Marland Kitchen amitriptyline (ELAVIL) 25 MG tablet TAKE ONE TABLET AT BEDTIME.  30 tablet  6  . aspirin 81 MG tablet Take 81 mg by mouth daily.        Marland Kitchen atorvastatin (LIPITOR) 20 MG tablet Take 1 tablet (20 mg total) by mouth daily.  30 tablet  PRN  . Azelastine-Fluticasone  137-50 MCG/ACT SUSP Place 1-2 sprays into the nose 2 (two) times daily as needed.  1 Bottle  5  . calcitonin, salmon, (MIACALCIN/FORTICAL) 200 UNIT/ACT nasal spray Place 1 spray into the nose daily.      . Calcium Carbonate-Vitamin D (CALCIUM 600+D) 600-400 MG-UNIT per tablet Take 1 tablet by mouth daily.       . Cholecalciferol (VITAMIN D) 1000 UNITS capsule Take 1,000 Units by mouth daily.        . clotrimazole-betamethasone (LOTRISONE) cream APPLY TO RASH TWICE DAILY.  15 g  2  . conjugated estrogens (PREMARIN) vaginal cream daily.       . ferrous gluconate (FERGON) 325 MG tablet Take 325 mg by mouth daily with breakfast.       . Fluticasone-Salmeterol (ADVAIR DISKUS) 250-50 MCG/DOSE AEPB Inhale  1 puff into the lungs 2 (two) times daily.  60 each  11  . furosemide (LASIX) 40 MG tablet TAKE 1 TABLET ONCE DAILY.  30 tablet  6  . Guaifenesin 1200 MG TB12 Take 1 tablet by mouth 2 (two) times daily.      Marland Kitchen ipratropium (ATROVENT) 0.06 % nasal spray USE 2 SPRAYS EACH NOSTRIL 4 TIMES A DAY.  15 mL  6  . ketoconazole (NIZORAL) 2 % shampoo Use as directed  120 mL  PRN  . losartan (COZAAR) 25 MG tablet Take 1 tablet (25 mg total) by mouth daily.  30 tablet  5  . metoCLOPramide (REGLAN) 10 MG tablet TAKE ONE TABLET AT BEDTIME.  30 tablet  6  . Multiple Vitamin (MULTIVITAMIN) capsule Take 1 capsule by mouth daily.        . potassium chloride SA (K-DUR,KLOR-CON) 20 MEQ tablet TAKE 1 TABLET ONCE DAILY.  30 tablet  5  . predniSONE (DELTASONE) 20 MG tablet Take as directed  50 tablet  5  . PRILOSEC OTC 20 MG tablet TAKE 1 TABLET ONCE DAILY.  42 tablet  6  . simvastatin (ZOCOR) 40 MG tablet Take 40 mg by mouth at bedtime.        Marland Kitchen ULORIC 40 MG tablet TAKE 1 TABLET EACH DAY.  30 tablet  6  . ULTRAM 50 MG tablet TAKE ONE TABLET EVERY 6 HOURS AS NEEDED FOR PAIN (MAY TAKE WITH TYLENOL).  90 each  5   No facility-administered encounter medications on file as of 06/14/2012.    Allergies  Allergen Reactions  .  Allopurinol     REACTION: rash  . Codeine   . Hydrocodone     REACTION: hallucinations  . Morphine     REACTION: hallucinations    Review of Systems       Constitutional:   No  weight loss, night sweats,  Fevers, chills, + fatigue, or  lassitude.  HEENT:   No headaches,  Difficulty swallowing,  Tooth/dental problems, or  Sore throat,                No sneezing, itching, ear ache,  +nasal congestion, post nasal drip,   CV:  No chest pain,  Orthopnea, PND, swelling in lower extremities, anasarca, dizziness, palpitations, syncope.   GI  No heartburn, indigestion, abdominal pain, nausea, vomiting, diarrhea, change in bowel habits, loss of appetite, bloody stools.   Resp:   No coughing up of blood.    No chest wall deformity  Skin: no rash or lesions.  GU: no dysuria, change in color of urine, no urgency or frequency.  No flank pain, no hematuria   Psych:  No change in mood or affect. No depression or anxiety.  No memory loss.   Neuro: no visual/speech changes , fever, neck pain/stiffness, ext weakness, paraesthesias , dysphagia /dysphasia         Objective:   Physical Exam    79  y/o WF in NAD.  GENERAL:  Alert & oriented; pleasant & cooperative... HEENT:  [place]/AT, EACs-clear, TMs-wnl, NOSE-clear, THROAT-clear & wnl NECK:  Supple w/ decrROM; no JVD; normal carotid impulses w/o bruits; no thyromegaly or nodules palpated; no lymphadenopathy. Lips pink and moist, no pallor or cyanosis noted  Along face multiple spider veins w/ skin thinning and prominent veins noted.  CHEST: Few scattered rhonchi , no wheezing  HEART:  Regular Rhythm;  gr 1-2/6 SEM without rubs or gallops detected... ABDOMEN:  Soft & nontender; normal  bowel sounds; no organomegaly or masses palpated... EXT: +hand deformities, w/ tophi & mod arthritic changes & contractures... right knee arthritis & severe foot deformities as well. no varicose veins/+venous insuffic/ tr edema. DERM:  no lesions- ecchymoses,  thin skin, etc... Diffuse ecchymotic areas along forearms R>L    Assessment & Plan:

## 2012-06-14 NOTE — Assessment & Plan Note (Signed)
Hx of Anemia , last labs reviewed in detail on 03/2012 w/ nml hbg  Appearance on face appears to be spider veins secondary to thinning of skin due to chronic steroids and age related skin changes No associated symptoms are reported and exam without acute findings to suggest an underlying occult issue Will check labs with cbc to make sure hbg is still stable  She is to follow up next week as planned  Please contact office for sooner follow up if symptoms do not improve or worsen or seek emergency care

## 2012-06-14 NOTE — Assessment & Plan Note (Signed)
Slow to resolve flare, improving  Cont on slow steroid taper as directed follow up with Dr. Kriste Basque  Next week

## 2012-06-20 NOTE — Progress Notes (Signed)
Quick Note:  Called spoke with patient, advised of lab results / recs as stated by TP. Pt verbalized her understanding and denied any questions. ______ 

## 2012-06-22 ENCOUNTER — Encounter: Payer: Self-pay | Admitting: Pulmonary Disease

## 2012-06-22 ENCOUNTER — Ambulatory Visit (INDEPENDENT_AMBULATORY_CARE_PROVIDER_SITE_OTHER): Payer: Medicare PPO | Admitting: Pulmonary Disease

## 2012-06-22 VITALS — BP 124/80 | HR 87 | Temp 99.7°F | Ht 60.0 in | Wt 129.8 lb

## 2012-06-22 DIAGNOSIS — I359 Nonrheumatic aortic valve disorder, unspecified: Secondary | ICD-10-CM

## 2012-06-22 DIAGNOSIS — I739 Peripheral vascular disease, unspecified: Secondary | ICD-10-CM

## 2012-06-22 DIAGNOSIS — I10 Essential (primary) hypertension: Secondary | ICD-10-CM

## 2012-06-22 DIAGNOSIS — E785 Hyperlipidemia, unspecified: Secondary | ICD-10-CM

## 2012-06-22 DIAGNOSIS — M1990 Unspecified osteoarthritis, unspecified site: Secondary | ICD-10-CM

## 2012-06-22 DIAGNOSIS — K449 Diaphragmatic hernia without obstruction or gangrene: Secondary | ICD-10-CM

## 2012-06-22 DIAGNOSIS — J449 Chronic obstructive pulmonary disease, unspecified: Secondary | ICD-10-CM | POA: Insufficient documentation

## 2012-06-22 DIAGNOSIS — J4489 Other specified chronic obstructive pulmonary disease: Secondary | ICD-10-CM | POA: Insufficient documentation

## 2012-06-22 DIAGNOSIS — K219 Gastro-esophageal reflux disease without esophagitis: Secondary | ICD-10-CM

## 2012-06-22 DIAGNOSIS — I872 Venous insufficiency (chronic) (peripheral): Secondary | ICD-10-CM

## 2012-06-22 DIAGNOSIS — F411 Generalized anxiety disorder: Secondary | ICD-10-CM

## 2012-06-22 DIAGNOSIS — M810 Age-related osteoporosis without current pathological fracture: Secondary | ICD-10-CM

## 2012-06-22 NOTE — Progress Notes (Signed)
Subjective:    Patient ID: Andrea Rubio, female    DOB: 10-Mar-1925, 79 y.o.   MRN: [id number]  HPI 79 y/o WF here for a follow up visit... she has multiple medical problems as noted below...   ~  May 27, 2011:  266mo ROV & Andrea Rubio fell in her bathroom several weeks ago- hit the back of her head & left arm abrasion; EMS checked her & dressed her wound and she decided to see her local physician DrWElkins; he redressed the left arm abrasion & reassured her;  We rechecked her occipital area- sm scab ok to wash etc, and left arm abrasion healing nicely; She knows to be more careful to avoid falls etc... We reviewed her meds, problem list, & prev labs> see below>>    She tells me [facility]u about a month ago> stable, no changes made (we don't get notes from him)...  ~  October 07, 2011:  266mo ROV     She saw Urology 7/13 for Estring change> this has been controlling her dysuria & is done every 66mo...    She is followed by Susann Givens for Ortho> L1 compression fx ~4/13; on Tramadol & Miacalcin NS, Vit D, & Calcium... We reviewed prob list, meds, xrays and labs> see below>> She requests refill of Nizoral shampoo- ok... LABS 9/13:  FLP- not at goals on Simva40, reminded to take everyday;  Chems- wnl;  CBC- wnl  ~  February 10, 2012:  266mo ROV & Andrea Rubio has had a refractory AB episode w/ persist cough & discolored mucus; now on Avelox & we reviewed the need to maximize her Rx w/ Pred 5mg /d, NEBS Tid, Advair250Bid, Mucinex-2Bid, Fluids, etc... BP controlled on Lisin + Lasix;  Cards followed by [id number];  She was started on Atorva20 but didn't bring her med bottles to review (as requested)...    We reviewed prob list, meds, xrays and labs> see below for updates >>   ~  March 14, 2012:  42mo ROV & Andrea Rubio has improved but not all the way resolved from her recent AB exac (see below); We reviewed the following medical problems during today's office visit >>     AB> she had AB exac last month &  Rx w/ Avelox, Pred, Nebs, Advair250, Mucinex, etc; she is improved but not back to baseline & we are forced to bump Pred to 20mg  tabs- 4d taper + NEBS Qid, max Mucinex, etc...    HBP> now on Cozaar25, Lasix40, K20; BP=122/82 & she denies CP, palpit, ch in SOB, edema, etc...    Cardiac> severeAS & CHF> followed by NWGNFAOZH, meds reviewed, we don't have notes from him...    Hyperlipid> states she is off the Lip20; she did not bring med list or bottles to review; asked to have Cards send notes to me & f/u FLP off meds...    HH, GERD, Divertics> on Prilosec20 & Metaclop10qhs; sister died w/ colon ca- pts last colonoscopy 6/06 by [facility] w/o polyps (+divertics & ischemic colitis)...    DJD, Gout, LBP> on Uloric40 & Tramadol50 prn; she has severe deformities & difficulty getting up & about; she desperately needs more PT but she refuses...    Osteoporosis> on Miacalcin NS;     Anxiety> she takes [id number] but declines anxiolytic rx... We reviewed prob list, meds, xrays and labs> see below for updates >>  LABS 2/14:  Chems- wnl;  CBC- wnl;  BNP= 427...   ~  April 20, 2012:  79mo ROV & Andrea Rubio has stabilized by bumping up Pred, now weaned back down to 5mg /d plus her max regimen... She is managing as best she can w/ help of her son at home; notes some pain in her left hip- known osteoporosis & BMD 3/14 showed TScores +0.7 in Spine, and -2.3 in left Fem Neck, and -1.9 in Forearm radius; she's been on the Miacalcin NS "it seems to be helping" she says, therefore continue the same for now...  BP stable, denies CP, palpit, ch in SOB etc... She has severe AS followed by [facility] but apparently there is not much he can do...    We reviewed prob list, meds, xrays and labs> see below for updates >> we will recheck her in 2-3 months...  ~  May 24, 2012:  79mo ROV & in the interval Andrea Rubio had yet another exac characterized by incr cough, yellow sput, congestion, wheezing & SOB; we called in Levaquin, then had to bump the  Pred back up (first to 10mg /d, then to 20mg /d);  She reports that she is a lot better now on the Pred20- "I'm better than I've been the whole time"- and we discussed a SLOW wean to try to determine her threshold dose> for now decr to 20 alt w/ 10mg  Qod & f/u 79mo...  She will also continue on her Miacalcin NS, Calcium, MVI, VitD 1000u/d...    We reviewed prob list, meds, xrays and labs> see below for updates >>   ~  Jun 22, 2012:  79mo ROV & Andrea Rubio has been on Pred20mg - taking one tab alt w/ 1/2 tab Qod for the last month; pt states "I'm a lot better, just can't get over the bronchitis from last fall";  She has lost 3# & edema is diminished- BP= 124/80, Chest exam w/ exp rhonchi & she is reminded to take the Mucinex regularly;  I gave her & son the option of continuing the Pred 20-10 vs wean further to 10mg /d & they prefer the latter (told it was ok to take an extra 10mg  Pred on any day she noted incr trouble breathing)...     We reviewed prob list, meds, xrays and labs> see below for updates >>           Problem List:    MACULAR DEGENERATION (ICD-362.50) - she is legally blind & son helps out at home...  SINUSITIS, CHRONIC (ICD-473.9) - she is s/p bliat [facility] 1986 by DrKraus... ~  CT Sinus 11/09 showed chr sinusitis changes in the max & sphenoid regions... ~  8/12: she is c/o drippy nose, trial Atrovent nasal spray...  ASTHMATIC BRONCHITIS, ACUTE (ICD-466.0) - on PRED 5mg /d, ADVAIR 250Bid, & ALBUTEROL NEBS Tid... stable- min cough, w/o sputum, no change in DOE, etc... ~  PFT's 3/06 showed FVC= 1.49 (68%), FEV1= 1.12 (75%), %1sec=75, mid-flows= 43%pred... ~  [facility] 8/09 w/ bilat pneumonia... see DC Summary. ~  CXR 10/09 showed stable cardiomeg, tort Ao, clear lungs, bilat shoulder arthroplasties... ~  CT Angio Chest 11/09 showed neg for PE, fluid/ mucus plugs in depend LL bronchi... ~  CXR 10/10 showed stable cardiomeg, bilat humeral head prostheses, NAD.Marland Kitchen. ~  CXR 1/12 showed COPD, chr  changes, ?right basilar opacity ==> subsequent films w/ incr edema. ~  CXR 5/12 showed cardiomeg, sm right effusion, no edema, bilat shoulder arthroplasties, NAD... ~  10/12:  AB exac treated by TP w/ Augmentin & improved... ~  1/14:  AB exac treated w/ Avelox, Pred taper, NEBS qid, Advair250,  Mucinex, etc... ~  3/14: back on Pred 5mg /d and stable once again=> then had yet another exac requiring Rx w/ Levaquin, incr Pred to 20mg /d... ~  4/14:  Much improved on Pred20/d & we decided to decr to 20-10 Qod & recheck 39mo... ~  5/14:  She appears stable 7 wants to attempt further Pred taper to 10mg /d w/ ROV 39mo...  HYPERTENSION (ICD-401.9) >>  ~  8/12: her BP's have stabilized at home and measures 112/68 here today... she denies CP, palpit, syncope, ch in edema... ~  12/12:on Lisin5/d, Lasix40/d & K20/d; BP= 126/72 & clinically stable w/o CP, palpit, ch in SOB, etc... ~  4/13: on Lisin5/d, Lasix40/d & K20/d; BP= 108/62 & usually sl better at home; still denies any CP, palpit, ch in SOB, etc... ~  2/14: now on Cozaar25, Lasix40, K20; BP=122/82 & she denies CP, palpit, ch in SOB, edema, etc ~  5/14: on Cozaar25, Lasix40, K20; BP=124/80 & she denies CP, palpit, ch in SOB, edema, etc...  AORTIC STENOSIS (ICD-424.1)   << followed by [id number] for Cards >> Hx of CHF (ICD-428.0)  ~  2DEcho 3/06 showed mild AS/AI w/ mod incr AoV thickness & reduced leaflet excursion, mild MR, norm EF=55-65%...  ~  2DEcho 12/08 showed mod inc AoV thickness, mod reduced AV leaflet excursion, c/w mod AS, mild AI & mean grad= ;  norm LVF w/ EF= 65% and no wall motion abn etc... ~  2DEcho 1/12 in [place] showed severeAS & mildAI, mild conc LVH, norm sys func w/ EF=60-65%, HK at apex, grI DD... ~  EKG 1/12 showed NSR, poor R progression, NSSTTWA... ~  We do not have follow up notes from [facility], pt indicates no change in meds...  PERIPHERAL VASCULAR DISEASE (ICD-443.9) - on ASA 81mg /d... she can't walk enough to elicit  discomfort, no rest pain or lesions... ~  ABI's 12/01 showed .88 on R, and .95 on L... ~  MRI showed small vessel dis & small infarct noted...  VENOUS INSUFFICIENCY (ICD-459.81) - on low sodium diet, Lasix, KCl...  HYPERLIPIDEMIA (ICD-272.4) - prev on Zocor 40mg  per WGNFAOZHY- off now;  (prev on Pravachol, but stated intol to other Statins)... takes Fish Oil ~2000mg /d rec by her ophthalmologist... ~  FLP 9/05 ?on Prav40 showed TChol 176, TG 190, HDL 55, LDL 83... ~  FLP 4/10 on diet alone showed TChol 211, TG 153, HDL 44, LDL 128... she refuses med Rx- continue diet efforts. ~  1/12:  Hosp by [facility] & consulted DrKadakia who started her on ZOCOR40mg /d> he is checking her labs now... ~  12/12: she crossed Simva40 off her list ?stopped on her own or ?stopped by [place]... ~  2013:  She still has Simva40 on her list here but NEVER brings a list from [place] or her med bottles to review... ~  9/13:  FLP here showed TChol 206, TG 167, HDL 48, LDL 133... She has agreed to try ATORVASTATIN 20mg /d... ~  2/14: she reports that she is off all statin meds, on diet alone...  HIATAL HERNIA (ICD-553.3) - on PRILOSEC 20mg /d & METACLOPRAMIDE 10mg Qhs... GERD (ICD-530.81) - EGD 3/06 by DrStark showed 3cmHH, mild esophagitis...  DIVERTICULOSIS, COLON (ICD-562.10) - last colonoscopy was 6/06 by [facility] showed divertics and ischemic colitis...  ~  Adm 1/12 by [facility] w/ Diverticulitis, treated & improved, she has not followed up w/ GI; she take 1/2 capful of MIRALAX daily.  STRESS INCONTINENCE (ICD-788.39) - she has hx UTI's & dysuria on & off... ~  10/10: urine c/s +EColi & Rx'd w/ Cipro. ~  7/12: she reports vaginal estrogen per Urology has helped her urinary symptoms; note from DrOttelin reviewed. ~  Labs 12/12 w/ normal renal function & Creat=0.7 ~  10/13: she had f/u DrOttelin> Hx trophic vaginitis, dysuria, & recurrent UTIs; rec topical estrogen=> estring (he replaced it that day); PVR=100; he rec ROV in 50mo for  estring replacement. ~  2/14: f/u post menopausal atrophic vaginitis, chr cystitis, on Estring but cannot replace it herself- seen Q50mo for this purpose...  DEGENERATIVE JOINT DISEASE (ICD-715.90) - she has severe DJD & Gout w/ deformities- Foot XRay showed Charcot foot deformity, severe degenerative dis, etc...  added TRAMADOL + TYLENOL Prn... GOUT, UNSPECIFIED (ICD-274.9) - labs 4/10 showed Uric Acid level= 7.9.Marland KitchenMarland Kitchen she has hx of gout & hyperuricemia but allergic to Allopurinol w/ rash... therefore on ULORIC 40mg /d... Uric Acid level 5/11 on Uloric40 = 3.2 LOW BACK PAIN (ICD-724.2) NECK PAIN (ICD-723.1) - eval by Susann Givens ==> DrLewitt 2010 w/ MRI & Rx  w/ Lidoderm, Tylenol, Lyrica- now on NEURONTIN... OSTEOPOROSIS (ICD-733.00) - prev on Fosamax (stopped 1/12 in hosp "my kidneys can't handle it"); on Ca++, Vits, & Vit D... ~  labs 4/10 showed Vit D level = 28... rec> start Vit D OTC 1000 u daily. ~  labs 5/11 showed Vit D level = 32... rec incr Vit D to 2000 u daily. ~  2013:  She fell w/ L1 compression & managed by Susann Givens w/ MIACALCIN NS & Tramadol... ~  3/14:  BMD here showed TScores +0.7 in Spine, and -2.3 in left Fem Neck, and -1.9 in Forearm radius; she feels the Miacalcin is helping, therefore continue same...  ANXIETY (ICD-300.00) - off prev Alpraz Rx & takes AMITRIPTYLINE 25mg  Qhs... under stress w/ 66 y/o son had MI & stent in 2010.  Hx of ANEMIA (ICD-285.9) - hx anemia req 2U transfusion 6/06 hosp for ischemic colitis...  ~  Hg= 12 - 13 over 2008-9. ~  labs 8/09 hosp showed Hg= 14 ~  labs 4/10 showed Hg= 12.2 ~  labs 3/11 showed Hg= 12.5, MCV= 92, Fe= 22 (9%sat)... start FeSO4 Bid. ~  labs 5/11 showed = 13.0, MCV= 94, Fe= 57 (23%)... continue Fe supplement. ~  Labs 12/12 showed Hg= 13.1, Fe= 35, (14%sat)... rec to continue Fe+VitC Bid... ~  Labs 9/13 showed Hg= 13.7, MCV=94  DERM>  She has skin cancer removed from the right side of her face by DrDanJones (Spring2012)...   Past  Surgical History  Procedure Laterality Date  . Lumbar laminectomy    . Shoulder surgery      bilateral    Outpatient Encounter Prescriptions as of 06/22/2012  Medication Sig Dispense Refill  . albuterol (PROVENTIL) (2.5 MG/3ML) 0.083% nebulizer solution Take 2.5 mg by nebulization 4 (four) times daily.       Marland Kitchen amitriptyline (ELAVIL) 25 MG tablet TAKE ONE TABLET AT BEDTIME.  30 tablet  6  . aspirin 81 MG tablet Take 81 mg by mouth daily.        Marland Kitchen atorvastatin (LIPITOR) 20 MG tablet Take 1 tablet (20 mg total) by mouth daily.  30 tablet  PRN  . Azelastine-Fluticasone 137-50 MCG/ACT SUSP Place 1-2 sprays into the nose 2 (two) times daily as needed.  1 Bottle  5  . calcitonin, salmon, (MIACALCIN/FORTICAL) 200 UNIT/ACT nasal spray Place 1 spray into the nose daily.      . Calcium Carbonate-Vitamin D (CALCIUM 600+D) 600-400 MG-UNIT per tablet Take 1  tablet by mouth daily.       . Cholecalciferol (VITAMIN D) 1000 UNITS capsule Take 1,000 Units by mouth daily.        . clotrimazole-betamethasone (LOTRISONE) cream APPLY TO RASH TWICE DAILY.  15 g  2  . conjugated estrogens (PREMARIN) vaginal cream daily.       . ferrous gluconate (FERGON) 325 MG tablet Take 325 mg by mouth daily with breakfast.       . Fluticasone-Salmeterol (ADVAIR DISKUS) 250-50 MCG/DOSE AEPB Inhale 1 puff into the lungs 2 (two) times daily.  60 each  11  . furosemide (LASIX) 40 MG tablet TAKE 1 TABLET ONCE DAILY.  30 tablet  6  . Guaifenesin 1200 MG TB12 Take 1 tablet by mouth 2 (two) times daily.      Marland Kitchen ipratropium (ATROVENT) 0.06 % nasal spray USE 2 SPRAYS EACH NOSTRIL 4 TIMES A DAY.  15 mL  6  . ketoconazole (NIZORAL) 2 % shampoo Use as directed  120 mL  PRN  . losartan (COZAAR) 25 MG tablet Take 1 tablet (25 mg total) by mouth daily.  30 tablet  5  . metoCLOPramide (REGLAN) 10 MG tablet TAKE ONE TABLET AT BEDTIME.  30 tablet  6  . Multiple Vitamin (MULTIVITAMIN) capsule Take 1 capsule by mouth daily.        Marland Kitchen omeprazole  (PRILOSEC) 20 MG capsule Take one tablet daily      . potassium chloride SA (K-DUR,KLOR-CON) 20 MEQ tablet TAKE 1 TABLET ONCE DAILY.  30 tablet  5  . predniSONE (DELTASONE) 20 MG tablet Take as directed  50 tablet  5  . simvastatin (ZOCOR) 40 MG tablet Take 40 mg by mouth at bedtime.        Marland Kitchen ULORIC 40 MG tablet TAKE 1 TABLET EACH DAY.  30 tablet  6  . ULTRAM 50 MG tablet TAKE ONE TABLET EVERY 6 HOURS AS NEEDED FOR PAIN (MAY TAKE WITH TYLENOL).  90 each  5   No facility-administered encounter medications on file as of 06/22/2012.    Allergies  Allergen Reactions  . Allopurinol     REACTION: rash  . Codeine   . Hydrocodone     REACTION: hallucinations  . Morphine     REACTION: hallucinations    Current Medications, Allergies, Past Medical History, Past Surgical History, Family History, and Social History were reviewed in [employer] record.    Review of Systems        See HPI - all other systems neg except as noted... The patient complains of dyspnea on exertion, peripheral edema, abdominal pain, indigestion/heartburn, muscle weakness, and difficulty walking.  The patient denies anorexia, fever, weight loss, weight gain, decreased hearing, hoarseness, chest pain, syncope,  headaches, hemoptysis, melena, hematochezia, hematuria, incontinence, suspicious skin lesions, depression, unusual weight change, abnormal bleeding, enlarged lymph nodes, and angioedema.     Objective:   Physical Exam    WD, Petite, sl overweight, 79 y/o WF in NAD... she is <5' tall, weight 130# GENERAL:  Alert & oriented; pleasant & cooperative... HEENT:  [place]/AT, EACs-clear, TMs-wnl, NOSE-clear, THROAT-clear & wnl... known macular degen per ophthal... NECK:  Supple w/ decrROM; no JVD; normal carotid impulses w/o bruits; no thyromegaly or nodules palpated; no lymphadenopathy. CHEST:  Decr BS bilat, Coarse BS, no wheezing or rhonchi... HEART:  Regular Rhythm;  gr 2/6 SEM without rubs or  gallops detected... ABDOMEN:  Soft & nontender; normal bowel sounds; no organomegaly or masses palpated... EXT: +hand  deformities, w/ tophi & mod arthritic changes & contractures... right knee arthritis & severe foot deformities as well. no varicose veins/+venous insuffic/ tr edema. NEURO:  CN's intact x reduced vision... diffusely weak, without focal changes... +gait abn... DERM:  Abrasion over occiput & on left arm; +ecchymoses, thin skin, etc...  RADIOLOGY DATA:  Reviewed in the EPIC EMR & discussed w/ the patient...  LABORATORY DATA:  Reviewed in the EPIC EMR & discussed w/ the patient...   Assessment & Plan:    AB>  We discussed the need to avoid infections & maximize her home resp treatments; now much improved on Pred20-10Qod & we are trying to determine her threshold dose- Rec decr to 10mg /d & ROV 18mo...  HBP & Diastolic CHF>  Controlled on meds- Losatan & Lasix; continue same; she will f/u w/ [id number] as planned.  AS>  Followed by HYQMVHQIO for her severe AS, not a surg candidate...  HYPERLIPID>  Off Simva40 now ?per Cards or on her own; she will check w/ DrKadakia==> we don't have notes, apparently off all meds, she agrees to try ATORVASTATIN20=> stopped on her own...  GI>  Hx HH, GERD, Divertics, Colitis>  On Prilosec, Reglan 10mg  Qhs (she does not want to stop this med), & Miralax as noted...  GU>  Dysuria improved w/ vaginal cream (no estring) & it is changed Q48mo...  DJD/ Gout/ Neck&Back Pain>  As noted- on Tramadol, Tylenol, Uloric; she is weak in the legs, hard to stand & walk, I'm afraid she will become immobile, unable to stand or walk making self care difficult but she is unperturbed by this & refuses my rec for more home PT...  Osteoporosis>  BMD w/ [facility]3 in [facility]; she is on Miacalcin NS & feels this is helping- continue same...  Anxiety>  She has Amitriptyline 25Qhs, not on anxiolytic...   Patient's Medications  New Prescriptions   No medications on  file  Previous Medications   ALBUTEROL (PROVENTIL) (2.5 MG/3ML) 0.083% NEBULIZER SOLUTION    Take 2.5 mg by nebulization 4 (four) times daily.    AMITRIPTYLINE (ELAVIL) 25 MG TABLET    TAKE ONE TABLET AT BEDTIME.   ASPIRIN 81 MG TABLET    Take 81 mg by mouth daily.     ATORVASTATIN (LIPITOR) 20 MG TABLET    Take 1 tablet (20 mg total) by mouth daily.   AZELASTINE-FLUTICASONE 137-50 MCG/ACT SUSP    Place 1-2 sprays into the nose 2 (two) times daily as needed.   CALCITONIN, SALMON, (MIACALCIN/FORTICAL) 200 UNIT/ACT NASAL SPRAY    Place 1 spray into the nose daily.   CALCIUM CARBONATE-VITAMIN D (CALCIUM 600+D) 600-400 MG-UNIT PER TABLET    Take 1 tablet by mouth daily.    CHOLECALCIFEROL (VITAMIN D) 1000 UNITS CAPSULE    Take 1,000 Units by mouth daily.     CLOTRIMAZOLE-BETAMETHASONE (LOTRISONE) CREAM    APPLY TO RASH TWICE DAILY.   CONJUGATED ESTROGENS (PREMARIN) VAGINAL CREAM    daily.    FERROUS GLUCONATE (FERGON) 325 MG TABLET    Take 325 mg by mouth daily with breakfast.    FLUTICASONE-SALMETEROL (ADVAIR DISKUS) 250-50 MCG/DOSE AEPB    Inhale 1 puff into the lungs 2 (two) times daily.   FUROSEMIDE (LASIX) 40 MG TABLET    TAKE 1 TABLET ONCE DAILY.   GUAIFENESIN 1200 MG TB12    Take 1 tablet by mouth 2 (two) times daily.   IPRATROPIUM (ATROVENT) 0.06 % NASAL SPRAY    USE 2 SPRAYS EACH  NOSTRIL 4 TIMES A DAY.   KETOCONAZOLE (NIZORAL) 2 % SHAMPOO    Use as directed   LOSARTAN (COZAAR) 25 MG TABLET    Take 1 tablet (25 mg total) by mouth daily.   METOCLOPRAMIDE (REGLAN) 10 MG TABLET    TAKE ONE TABLET AT BEDTIME.   MULTIPLE VITAMIN (MULTIVITAMIN) CAPSULE    Take 1 capsule by mouth daily.     OMEPRAZOLE (PRILOSEC) 20 MG CAPSULE    Take one tablet daily   POTASSIUM CHLORIDE SA (K-DUR,KLOR-CON) 20 MEQ TABLET    TAKE 1 TABLET ONCE DAILY.   SIMVASTATIN (ZOCOR) 40 MG TABLET    Take 40 mg by mouth at bedtime.     ULORIC 40 MG TABLET    TAKE 1 TABLET EACH DAY.   ULTRAM 50 MG TABLET    TAKE ONE TABLET  EVERY 6 HOURS AS NEEDED FOR PAIN (MAY TAKE WITH TYLENOL).  Modified Medications   Modified Medication Previous Medication   PREDNISONE (DELTASONE) 20 MG TABLET predniSONE (DELTASONE) 20 MG tablet      Take 1/2 tablet by mouth daily    Take as directed  Discontinued Medications   No medications on file

## 2012-06-22 NOTE — Patient Instructions (Addendum)
Today we updated your med list in our EPIC system...    We decided to try to "inch" down the Prednisone (from 20mg  alternating w/ 10mg  every other day) to 10mg  daily...    Don't forget that you may take an extra 10mg  (to equal 20mg  for the day) on any day you have increased breathing difficulty...  Call for any questions...  Let's plan a follow up visit in about 19mo.Marland KitchenMarland Kitchen

## 2012-07-15 ENCOUNTER — Encounter: Payer: Self-pay | Admitting: Pulmonary Disease

## 2012-07-18 ENCOUNTER — Telehealth: Payer: Self-pay | Admitting: Pulmonary Disease

## 2012-07-18 NOTE — Telephone Encounter (Signed)
Kept pt's appt w/ SN for 07/27/12 @ 2 PM.  Nothing further needed.  Antionette Fairy

## 2012-07-19 ENCOUNTER — Other Ambulatory Visit: Payer: Self-pay | Admitting: Pulmonary Disease

## 2012-07-27 ENCOUNTER — Ambulatory Visit (INDEPENDENT_AMBULATORY_CARE_PROVIDER_SITE_OTHER): Payer: Medicare PPO | Admitting: Pulmonary Disease

## 2012-07-27 ENCOUNTER — Encounter: Payer: Self-pay | Admitting: Pulmonary Disease

## 2012-07-27 VITALS — BP 118/68 | HR 88 | Temp 98.2°F | Ht 60.0 in | Wt 130.6 lb

## 2012-07-27 DIAGNOSIS — F411 Generalized anxiety disorder: Secondary | ICD-10-CM

## 2012-07-27 DIAGNOSIS — N39498 Other specified urinary incontinence: Secondary | ICD-10-CM

## 2012-07-27 DIAGNOSIS — E785 Hyperlipidemia, unspecified: Secondary | ICD-10-CM

## 2012-07-27 DIAGNOSIS — K449 Diaphragmatic hernia without obstruction or gangrene: Secondary | ICD-10-CM

## 2012-07-27 DIAGNOSIS — K5730 Diverticulosis of large intestine without perforation or abscess without bleeding: Secondary | ICD-10-CM

## 2012-07-27 DIAGNOSIS — J449 Chronic obstructive pulmonary disease, unspecified: Secondary | ICD-10-CM

## 2012-07-27 DIAGNOSIS — M810 Age-related osteoporosis without current pathological fracture: Secondary | ICD-10-CM

## 2012-07-27 DIAGNOSIS — I359 Nonrheumatic aortic valve disorder, unspecified: Secondary | ICD-10-CM

## 2012-07-27 DIAGNOSIS — K219 Gastro-esophageal reflux disease without esophagitis: Secondary | ICD-10-CM

## 2012-07-27 DIAGNOSIS — M1990 Unspecified osteoarthritis, unspecified site: Secondary | ICD-10-CM

## 2012-07-27 DIAGNOSIS — I10 Essential (primary) hypertension: Secondary | ICD-10-CM

## 2012-07-27 DIAGNOSIS — I739 Peripheral vascular disease, unspecified: Secondary | ICD-10-CM

## 2012-07-27 DIAGNOSIS — I872 Venous insufficiency (chronic) (peripheral): Secondary | ICD-10-CM

## 2012-07-27 MED ORDER — MONTELUKAST SODIUM 10 MG PO TABS: 10.0000 mg | ORAL_TABLET | Freq: Every day | ORAL | Status: DC

## 2012-07-27 MED ORDER — FLUTTER DEVI: Status: DC

## 2012-07-27 NOTE — Patient Instructions (Addendum)
Today we updated your med list in our EPIC system...    Continue your current medications the same...  Continue the Prednisone at 10mg  per day with an extra 10mg  on any day she is having increased diffuculty breathing...  Try the FLUTTER VALVE Rx- using this devise every hour or so to loosen & mobilize the phlegm in your airways...  We also wrote for a trial of Singulair which Genevie Cheshire wants to try to see if it is helpful for your breathing...   Call for any questions...  Let's plan a follow up visit in 6-8 wks, sooner if needed for problems.Marland KitchenMarland Kitchen

## 2012-07-27 NOTE — Progress Notes (Signed)
Subjective:    Patient ID: Andrea Rubio, female    DOB: 1926/02/03, 79 y.o.   MRN: [id number]  HPI 79 y/o WF here for a follow up visit... she has multiple medical problems as noted below...   ~  May 27, 2011:  62mo ROV & Andrea Rubio fell in her bathroom several weeks ago- hit the back of her head & left arm abrasion; EMS checked her & dressed her wound and she decided to see her local physician DrWElkins; he redressed the left arm abrasion & reassured her;  We rechecked her occipital area- sm scab ok to wash etc, and left arm abrasion healing nicely; She knows to be more careful to avoid falls etc... We reviewed her meds, problem list, & prev labs> see below>>    She tells me she saw [facility] for Cards f/u about a month ago> stable, no changes made (we don't get notes from him)...  ~  October 07, 2011:  62mo ROV     She saw Urology 7/13 for Estring change> this has been controlling her dysuria & is done every 743mo...    She is followed by Susann Givens for Ortho> L1 compression fx ~4/13; on Tramadol & Miacalcin NS, Vit D, & Calcium... We reviewed prob list, meds, xrays and labs> see below>> She requests refill of Nizoral shampoo- ok... LABS 9/13:  FLP- not at goals on Simva40, reminded to take everyday;  Chems- wnl;  CBC- wnl  ~  February 10, 2012:  62mo ROV & Andrea Rubio has had a refractory AB episode w/ persist cough & discolored mucus; now on Avelox & we reviewed the need to maximize her Rx w/ Pred 5mg /d, NEBS Tid, Advair250Bid, Mucinex-2Bid, Fluids, etc... BP controlled on Lisin + Lasix;  Cards followed by WGNFAOZHY;  She was started on Atorva20 but didn't bring her med bottles to review (as requested)...    We reviewed prob list, meds, xrays and labs> see below for updates >>   ~  March 14, 2012:  78mo ROV & Andrea Rubio has improved but not all the way resolved from her recent AB exac (see below); We reviewed the following medical problems during today's office visit >>     AB> she had AB exac last month &  Rx w/ Avelox, Pred, Nebs, Advair250, Mucinex, etc; she is improved but not back to baseline & we are forced to bump Pred to 20mg  tabs- 4d taper + NEBS Qid, max Mucinex, etc...    HBP> now on Cozaar25, Lasix40, K20; BP=122/82 & she denies CP, palpit, ch in SOB, edema, etc...    Cardiac> severeAS & CHF> followed by QMVHQIONG, meds reviewed, we don't have notes from him...    Hyperlipid> states she is off the Lip20; she did not bring med list or bottles to review; asked to have Cards send notes to me & f/u FLP off meds...    HH, GERD, Divertics> on Prilosec20 & Metaclop10qhs; sister died w/ colon ca- pts last colonoscopy 6/06 by [facility] w/o polyps (+divertics & ischemic colitis)...    DJD, Gout, LBP> on Uloric40 & Tramadol50 prn; she has severe deformities & difficulty getting up & about; she desperately needs more PT but she refuses...    Osteoporosis> on Miacalcin NS;     Anxiety> she takes [id number] but declines anxiolytic rx... We reviewed prob list, meds, xrays and labs> see below for updates >>  LABS 2/14:  Chems- wnl;  CBC- wnl;  BNP= 427...   ~  April 20, 2012:  9mo ROV & Andrea Rubio has stabilized by bumping up Pred, now weaned back down to 5mg /d plus her max regimen... She is managing as best she can w/ help of her son at home; notes some pain in her left hip- known osteoporosis & BMD 3/14 showed TScores +0.7 in Spine, and -2.3 in left Fem Neck, and -1.9 in Forearm radius; she's been on the Miacalcin NS "it seems to be helping" she says, therefore continue the same for now...  BP stable, denies CP, palpit, ch in SOB etc... She has severe AS followed by [facility] but apparently there is not much he can do...    We reviewed prob list, meds, xrays and labs> see below for updates >> we will recheck her in 2-3 months...  ~  May 24, 2012:  9mo ROV & in the interval Andrea Rubio had yet another exac characterized by incr cough, yellow sput, congestion, wheezing & SOB; we called in Levaquin, then had to bump the  Pred back up (first to 10mg /d, then to 20mg /d);  She reports that she is a lot better now on the Pred20- "I'm better than I've been the whole time"- and we discussed a SLOW wean to try to determine her threshold dose> for now decr to 20 alt w/ 10mg  Qod & f/u 9mo...  She will also continue on her Miacalcin NS, Calcium, MVI, VitD 1000u/d...    We reviewed prob list, meds, xrays and labs> see below for updates >>   ~  Jun 22, 2012:  9mo ROV & Andrea Rubio has been on Pred20mg - taking one tab alt w/ 1/2 tab Qod for the last month; pt states "I'm a lot better, just can't get over the bronchitis from last fall";  She has lost 3# & edema is diminished- BP= 124/80, Chest exam w/ exp rhonchi & she is reminded to take the Mucinex regularly;  I gave her & son the option of continuing the Pred 20-10 vs wean further to 10mg /d & they prefer the latter (told it was ok to take an extra 10mg  Pred on any day she noted incr trouble breathing)...     We reviewed prob list, meds, xrays and labs> see below for updates >>   ~  July 27, 2012:  9mo ROV & Andrea Rubio reports that she is "doing pretty good", has cut down her Pred to 10mg /d & rarely needs the extra 10mg  on any given day; she does not like the hot weather; her son wonders if Singulair would help her & we agreed to write a new Rx for this as a trial;  We also rec adding Flutter valve therapy to aide in mucocilliary production...  BP is stable at 118/68 and O2 sat= 96% on room air...          Problem List:    MACULAR DEGENERATION (ICD-362.50) - she is legally blind & son helps out at home...  SINUSITIS, CHRONIC (ICD-473.9) - she is s/p bliat [facility] 1986 by DrKraus... ~  CT Sinus 11/09 showed chr sinusitis changes in the max & sphenoid regions... ~  8/12: she is c/o drippy nose, trial Atrovent nasal spray...  ASTHMATIC BRONCHITIS, ACUTE (ICD-466.0) - on PRED 5mg /d, ADVAIR 250Bid, & ALBUTEROL NEBS Tid... stable- min cough, w/o sputum, no change in DOE, etc... ~   PFT's 3/06 showed FVC= 1.49 (68%), FEV1= 1.12 (75%), %1sec=75, mid-flows= 43%pred... ~  [facility] 8/09 w/ bilat pneumonia... see DC Summary. ~  CXR 10/09 showed stable cardiomeg, tort Ao, clear lungs, bilat shoulder  arthroplasties... ~  CT Angio Chest 11/09 showed neg for PE, fluid/ mucus plugs in depend LL bronchi... ~  CXR 10/10 showed stable cardiomeg, bilat humeral head prostheses, NAD.Marland Kitchen. ~  CXR 1/12 showed COPD, chr changes, ?right basilar opacity ==> subsequent films w/ incr edema. ~  CXR 5/12 showed cardiomeg, sm right effusion, no edema, bilat shoulder arthroplasties, NAD... ~  10/12:  AB exac treated by TP w/ Augmentin & improved... ~  1/14:  AB exac treated w/ Avelox, Pred taper, NEBS qid, Advair250, Mucinex, etc... ~  CXR 1/14 showed borderline heart size, sm bilat effusions and basilar atx...  ~  3/14: back on Pred 5mg /d and stable once again=> then had yet another exac requiring Rx w/ Levaquin, incr Pred to 20mg /d... ~  4/14:  Much improved on Pred20/d & we decided to decr to 20-10 Qod & recheck 40mo... ~  5/14:  She appears stable & wants to attempt further Pred taper to 10mg /d w/ ROV 42mo=> stable continue the same...  HYPERTENSION (ICD-401.9) >>  ~  8/12: her BP's have stabilized at home and measures 112/68 here today... she denies CP, palpit, syncope, ch in edema... ~  12/12:on Lisin5/d, Lasix40/d & K20/d; BP= 126/72 & clinically stable w/o CP, palpit, ch in SOB, etc... ~  4/13: on Lisin5/d, Lasix40/d & K20/d; BP= 108/62 & usually sl better at home; still denies any CP, palpit, ch in SOB, etc... ~  2/14: now on Cozaar25, Lasix40, K20; BP=122/82 & she denies CP, palpit, ch in SOB, edema, etc ~  5/14: on Cozaar25, Lasix40, K20; BP=124/80 & she denies CP, palpit, ch in SOB, edema, etc...  AORTIC STENOSIS (ICD-424.1)   << followed by ZOXWRUEAV for Cards >> Hx of CHF (ICD-428.0)  ~  2DEcho 3/06 showed mild AS/AI w/ mod incr AoV thickness & reduced leaflet excursion, mild MR, norm  EF=55-65%...  ~  2DEcho 12/08 showed mod inc AoV thickness, mod reduced AV leaflet excursion, c/w mod AS, mild AI & mean grad= ;  norm LVF w/ EF= 65% and no wall motion abn etc... ~  2DEcho 1/12 in [place] showed severeAS & mildAI, mild conc LVH, norm sys func w/ EF=60-65%, HK at apex, grI DD... ~  EKG 1/12 showed NSR, poor R progression, NSSTTWA... ~  We do not have follow up notes from [facility], pt indicates no change in meds...  PERIPHERAL VASCULAR DISEASE (ICD-443.9) - on ASA 81mg /d... she can't walk enough to elicit discomfort, no rest pain or lesions... ~  ABI's 12/01 showed .88 on R, and .95 on L... ~  MRI showed small vessel dis & small infarct noted...  VENOUS INSUFFICIENCY (ICD-459.81) - on low sodium diet, Lasix, KCl...  HYPERLIPIDEMIA (ICD-272.4) - prev on Zocor 40mg  per WUJWJXBJY- off now;  (prev on Pravachol, but stated intol to other Statins)... takes Fish Oil ~2000mg /d rec by her ophthalmologist... ~  FLP 9/05 ?on Prav40 showed TChol 176, TG 190, HDL 55, LDL 83... ~  FLP 4/10 on diet alone showed TChol 211, TG 153, HDL 44, LDL 128... she refuses med Rx- continue diet efforts. ~  1/12:  Hosp by [facility] & consulted DrKadakia who started her on ZOCOR40mg /d> he is checking her labs now... ~  12/12: she crossed Simva40 off her list ?stopped on her own or ?stopped by [place]... ~  2013:  She still has Simva40 on her list here but NEVER brings a list from [place] or her med bottles to review... ~  9/13:  FLP here showed TChol 206,  TG 167, HDL 48, LDL 133... She has agreed to try ATORVASTATIN 20mg /d... ~  2/14: she reports that she is off all statin meds, on diet alone...  HIATAL HERNIA (ICD-553.3) - on PRILOSEC 20mg /d & METACLOPRAMIDE 10mg Qhs... GERD (ICD-530.81) - EGD 3/06 by DrStark showed 3cmHH, mild esophagitis...  DIVERTICULOSIS, COLON (ICD-562.10) - last colonoscopy was 6/06 by [facility] showed divertics and ischemic colitis...  ~  Adm 1/12 by [facility] w/ Diverticulitis, treated &  improved, she has not followed up w/ GI; she take 1/2 capful of MIRALAX daily.  STRESS INCONTINENCE (ICD-788.39) - she has hx UTI's & dysuria on & off... ~  10/10: urine c/s +EColi & Rx'd w/ Cipro. ~  7/12: she reports vaginal estrogen per Urology has helped her urinary symptoms; note from DrOttelin reviewed. ~  Labs 12/12 w/ normal renal function & Creat=0.7 ~  10/13: she had f/u DrOttelin> Hx trophic vaginitis, dysuria, & recurrent UTIs; rec topical estrogen=> estring (he replaced it that day); PVR=100; he rec ROV in 75mo for estring replacement. ~  2/14: f/u post menopausal atrophic vaginitis, chr cystitis, on Estring but cannot replace it herself- seen Q54mo for this purpose...  DEGENERATIVE JOINT DISEASE (ICD-715.90) - she has severe DJD & Gout w/ deformities- Foot XRay showed Charcot foot deformity, severe degenerative dis, etc...  added TRAMADOL + TYLENOL Prn... GOUT, UNSPECIFIED (ICD-274.9) - labs 4/10 showed Uric Acid level= 7.9.Marland KitchenMarland Kitchen she has hx of gout & hyperuricemia but allergic to Allopurinol w/ rash... therefore on ULORIC 40mg /d... Uric Acid level 5/11 on Uloric40 = 3.2 LOW BACK PAIN (ICD-724.2) NECK PAIN (ICD-723.1) - eval by Susann Givens ==> DrLewitt 2010 w/ MRI & Rx  w/ Lidoderm, Tylenol, Lyrica- now on NEURONTIN... OSTEOPOROSIS (ICD-733.00) - prev on Fosamax (stopped 1/12 in hosp "my kidneys can't handle it"); on Ca++, Vits, & Vit D... ~  labs 4/10 showed Vit D level = 28... rec> start Vit D OTC 1000 u daily. ~  labs 5/11 showed Vit D level = 32... rec incr Vit D to 2000 u daily. ~  2013:  She fell w/ L1 compression & managed by Susann Givens w/ MIACALCIN NS & Tramadol... ~  3/14:  BMD here showed TScores +0.7 in Spine, and -2.3 in left Fem Neck, and -1.9 in Forearm radius; she feels the Miacalcin is helping, therefore continue same...  ANXIETY (ICD-300.00) - off prev Alpraz Rx & takes AMITRIPTYLINE 25mg  Qhs... under stress w/ 20 y/o son had MI & stent in 2010.  Hx of ANEMIA (ICD-285.9) - hx  anemia req 2U transfusion 6/06 hosp for ischemic colitis...  ~  Hg= 12 - 13 over 2008-9. ~  labs 8/09 hosp showed Hg= 14 ~  labs 4/10 showed Hg= 12.2 ~  labs 3/11 showed Hg= 12.5, MCV= 92, Fe= 22 (9%sat)... start FeSO4 Bid. ~  labs 5/11 showed = 13.0, MCV= 94, Fe= 57 (23%)... continue Fe supplement. ~  Labs 12/12 showed Hg= 13.1, Fe= 35, (14%sat)... rec to continue Fe+VitC Bid... ~  Labs 9/13 showed Hg= 13.7, MCV=94  DERM>  She has skin cancer removed from the right side of her face by DrDanJones (Spring2012)...   Past Surgical History  Procedure Laterality Date  . Lumbar laminectomy    . Shoulder surgery      bilateral    Outpatient Encounter Prescriptions as of 07/27/2012  Medication Sig Dispense Refill  . albuterol (PROVENTIL) (2.5 MG/3ML) 0.083% nebulizer solution Take 2.5 mg by nebulization 4 (four) times daily.       Marland Kitchen amitriptyline (ELAVIL)  25 MG tablet TAKE ONE TABLET AT BEDTIME.  30 tablet  6  . aspirin 81 MG tablet Take 81 mg by mouth daily.        Marland Kitchen atorvastatin (LIPITOR) 20 MG tablet Take 1 tablet (20 mg total) by mouth daily.  30 tablet  PRN  . Azelastine-Fluticasone 137-50 MCG/ACT SUSP Place 1-2 sprays into the nose 2 (two) times daily as needed.  1 Bottle  5  . calcitonin, salmon, (MIACALCIN/FORTICAL) 200 UNIT/ACT nasal spray Place 1 spray into the nose daily.      . Calcium Carbonate-Vitamin D (CALCIUM 600+D) 600-400 MG-UNIT per tablet Take 1 tablet by mouth daily.       . Cholecalciferol (VITAMIN D) 1000 UNITS capsule Take 1,000 Units by mouth daily.        . clotrimazole-betamethasone (LOTRISONE) cream APPLY TO RASH TWICE DAILY.  15 g  2  . conjugated estrogens (PREMARIN) vaginal cream daily.       . ferrous gluconate (FERGON) 325 MG tablet Take 325 mg by mouth daily with breakfast.       . Fluticasone-Salmeterol (ADVAIR DISKUS) 250-50 MCG/DOSE AEPB Inhale 1 puff into the lungs 2 (two) times daily.  60 each  11  . furosemide (LASIX) 40 MG tablet TAKE 1 TABLET ONCE  DAILY.  30 tablet  6  . Guaifenesin 1200 MG TB12 Take 1 tablet by mouth 2 (two) times daily.      Marland Kitchen ipratropium (ATROVENT) 0.06 % nasal spray USE 2 SPRAYS EACH NOSTRIL 4 TIMES A DAY.  15 mL  5  . ketoconazole (NIZORAL) 2 % shampoo Use as directed  120 mL  PRN  . losartan (COZAAR) 25 MG tablet Take 1 tablet (25 mg total) by mouth daily.  30 tablet  5  . metoCLOPramide (REGLAN) 10 MG tablet TAKE ONE TABLET AT BEDTIME.  30 tablet  6  . Multiple Vitamin (MULTIVITAMIN) capsule Take 1 capsule by mouth daily.        Marland Kitchen omeprazole (PRILOSEC) 20 MG capsule Take one tablet daily      . potassium chloride SA (K-DUR,KLOR-CON) 20 MEQ tablet TAKE 1 TABLET ONCE DAILY.  30 tablet  5  . predniSONE (DELTASONE) 20 MG tablet Take 1/2 tablet by mouth daily      . simvastatin (ZOCOR) 40 MG tablet Take 40 mg by mouth at bedtime.        Marland Kitchen ULORIC 40 MG tablet TAKE 1 TABLET EACH DAY.  30 tablet  6  . ULTRAM 50 MG tablet TAKE ONE TABLET EVERY 6 HOURS AS NEEDED FOR PAIN (MAY TAKE WITH TYLENOL).  90 each  5   No facility-administered encounter medications on file as of 07/27/2012.    Allergies  Allergen Reactions  . Allopurinol     REACTION: rash  . Codeine   . Hydrocodone     REACTION: hallucinations  . Morphine     REACTION: hallucinations    Current Medications, Allergies, Past Medical History, Past Surgical History, Family History, and Social History were reviewed in [employer] record.    Review of Systems        See HPI - all other systems neg except as noted... The patient complains of dyspnea on exertion, peripheral edema, abdominal pain, indigestion/heartburn, muscle weakness, and difficulty walking.  The patient denies anorexia, fever, weight loss, weight gain, decreased hearing, hoarseness, chest pain, syncope,  headaches, hemoptysis, melena, hematochezia, hematuria, incontinence, suspicious skin lesions, depression, unusual weight change, abnormal bleeding, enlarged lymph nodes,  and angioedema.     Objective:   Physical Exam    WD, Petite, sl overweight, 79 y/o WF in NAD... she is <5' tall, weight 130# GENERAL:  Alert & oriented; pleasant & cooperative... HEENT:  [place]/AT, EACs-clear, TMs-wnl, NOSE-clear, THROAT-clear & wnl... known macular degen per ophthal... NECK:  Supple w/ decrROM; no JVD; normal carotid impulses w/o bruits; no thyromegaly or nodules palpated; no lymphadenopathy. CHEST:  Decr BS bilat, Coarse BS, no wheezing or rhonchi... HEART:  Regular Rhythm;  gr 2/6 SEM without rubs or gallops detected... ABDOMEN:  Soft & nontender; normal bowel sounds; no organomegaly or masses palpated... EXT: +hand deformities, w/ tophi & mod arthritic changes & contractures... right knee arthritis & severe foot deformities as well. no varicose veins/+venous insuffic/ tr edema. NEURO:  CN's intact x reduced vision... diffusely weak, without focal changes... +gait abn... DERM:  Abrasion over occiput & on left arm; +ecchymoses, thin skin, etc...  RADIOLOGY DATA:  Reviewed in the EPIC EMR & discussed w/ the patient...  LABORATORY DATA:  Reviewed in the EPIC EMR & discussed w/ the patient...   Assessment & Plan:    AB>  We discussed the need to avoid infections & maximize her home resp treatments; stable on Pred10/d & we are trying to determine her threshold dose- keep same for now...  HBP & Diastolic CHF>  Controlled on meds- Losatan & Lasix; continue same; she will f/u w/ WUJWJXBJY as planned.  AS>  Followed by NWGNFAOZH for her severe AS, not a surg candidate...  HYPERLIPID>  Off Simva40 now ?per Cards or on her own; she will check w/ DrKadakia==> we don't have notes, apparently off all meds, she agrees to try ATORVASTATIN20=> stopped on her own...  GI>  Hx HH, GERD, Divertics, Colitis>  On Prilosec, Reglan 10mg  Qhs (she does not want to stop this med), & Miralax as noted...  GU>  Dysuria improved w/ vaginal cream (no estring) & it is changed Q54mo...  DJD/ Gout/  Neck&Back Pain>  As noted- on Tramadol, Tylenol, Uloric; she is weak in the legs, hard to stand & walk, I'm afraid she will become immobile, unable to stand or walk making self care difficult but she is unperturbed by this & refuses my rec for more home PT...  Osteoporosis>  BMD w/ TScore -2.3 in [facility]; she is on Miacalcin NS & feels this is helping- continue same...  Anxiety>  She has Amitriptyline 25Qhs, not on anxiolytic...   Patient's Medications  New Prescriptions   MONTELUKAST (SINGULAIR) 10 MG TABLET    Take 1 tablet (10 mg total) by mouth at bedtime.   RESPIRATORY THERAPY SUPPLIES (FLUTTER) DEVI    Use as directed  Previous Medications   ALBUTEROL (PROVENTIL) (2.5 MG/3ML) 0.083% NEBULIZER SOLUTION    Take 2.5 mg by nebulization 4 (four) times daily.    AMITRIPTYLINE (ELAVIL) 25 MG TABLET    TAKE ONE TABLET AT BEDTIME.   ASPIRIN 81 MG TABLET    Take 81 mg by mouth daily.     ATORVASTATIN (LIPITOR) 20 MG TABLET    Take 1 tablet (20 mg total) by mouth daily.   AZELASTINE-FLUTICASONE 137-50 MCG/ACT SUSP    Place 1-2 sprays into the nose 2 (two) times daily as needed.   CALCITONIN, SALMON, (MIACALCIN/FORTICAL) 200 UNIT/ACT NASAL SPRAY    Place 1 spray into the nose daily.   CALCIUM CARBONATE-VITAMIN D (CALCIUM 600+D) 600-400 MG-UNIT PER TABLET    Take 1 tablet by mouth daily.  CHOLECALCIFEROL (VITAMIN D) 1000 UNITS CAPSULE    Take 1,000 Units by mouth daily.     CLOTRIMAZOLE-BETAMETHASONE (LOTRISONE) CREAM    APPLY TO RASH TWICE DAILY.   CONJUGATED ESTROGENS (PREMARIN) VAGINAL CREAM    daily.    FERROUS GLUCONATE (FERGON) 325 MG TABLET    Take 325 mg by mouth daily with breakfast.    FLUTICASONE-SALMETEROL (ADVAIR DISKUS) 250-50 MCG/DOSE AEPB    Inhale 1 puff into the lungs 2 (two) times daily.   FUROSEMIDE (LASIX) 40 MG TABLET    TAKE 1 TABLET ONCE DAILY.   GUAIFENESIN 1200 MG TB12    Take 1 tablet by mouth 2 (two) times daily.   IPRATROPIUM (ATROVENT) 0.06 % NASAL SPRAY    USE 2  SPRAYS EACH NOSTRIL 4 TIMES A DAY.   KETOCONAZOLE (NIZORAL) 2 % SHAMPOO    Use as directed   LOSARTAN (COZAAR) 25 MG TABLET    Take 1 tablet (25 mg total) by mouth daily.   METOCLOPRAMIDE (REGLAN) 10 MG TABLET    TAKE ONE TABLET AT BEDTIME.   MULTIPLE VITAMIN (MULTIVITAMIN) CAPSULE    Take 1 capsule by mouth daily.     OMEPRAZOLE (PRILOSEC) 20 MG CAPSULE    Take one tablet daily   POTASSIUM CHLORIDE SA (K-DUR,KLOR-CON) 20 MEQ TABLET    TAKE 1 TABLET ONCE DAILY.   PREDNISONE (DELTASONE) 20 MG TABLET    Take 1/2 tablet by mouth daily   SIMVASTATIN (ZOCOR) 40 MG TABLET    Take 40 mg by mouth at bedtime.     ULORIC 40 MG TABLET    TAKE 1 TABLET EACH DAY.   ULTRAM 50 MG TABLET    TAKE ONE TABLET EVERY 6 HOURS AS NEEDED FOR PAIN (MAY TAKE WITH TYLENOL).  Modified Medications   No medications on file  Discontinued Medications   No medications on file

## 2012-08-11 ENCOUNTER — Other Ambulatory Visit: Payer: Self-pay | Admitting: Pulmonary Disease

## 2012-08-21 ENCOUNTER — Other Ambulatory Visit: Payer: Self-pay | Admitting: Pulmonary Disease

## 2012-08-22 ENCOUNTER — Other Ambulatory Visit: Payer: Self-pay | Admitting: Adult Health

## 2012-08-24 ENCOUNTER — Other Ambulatory Visit: Payer: Self-pay | Admitting: Pulmonary Disease

## 2012-09-01 ENCOUNTER — Other Ambulatory Visit: Payer: Self-pay | Admitting: Pulmonary Disease

## 2012-09-21 ENCOUNTER — Other Ambulatory Visit (INDEPENDENT_AMBULATORY_CARE_PROVIDER_SITE_OTHER): Payer: Medicare PPO

## 2012-09-21 ENCOUNTER — Ambulatory Visit (INDEPENDENT_AMBULATORY_CARE_PROVIDER_SITE_OTHER): Payer: Medicare PPO | Admitting: Pulmonary Disease

## 2012-09-21 ENCOUNTER — Encounter: Payer: Self-pay | Admitting: Pulmonary Disease

## 2012-09-21 ENCOUNTER — Ambulatory Visit (INDEPENDENT_AMBULATORY_CARE_PROVIDER_SITE_OTHER)
Admission: RE | Admit: 2012-09-21 | Discharge: 2012-09-21 | Disposition: A | Discharge status: Home / Self Care | Payer: Medicare PPO | Source: Ambulatory Visit | Attending: Pulmonary Disease | Admitting: Pulmonary Disease

## 2012-09-21 VITALS — BP 118/68 | HR 94 | Temp 98.2°F | Ht 60.0 in | Wt 127.8 lb

## 2012-09-21 DIAGNOSIS — I872 Venous insufficiency (chronic) (peripheral): Secondary | ICD-10-CM

## 2012-09-21 DIAGNOSIS — R0609 Other forms of dyspnea: Secondary | ICD-10-CM

## 2012-09-21 DIAGNOSIS — I10 Essential (primary) hypertension: Secondary | ICD-10-CM

## 2012-09-21 DIAGNOSIS — M1990 Unspecified osteoarthritis, unspecified site: Secondary | ICD-10-CM

## 2012-09-21 DIAGNOSIS — F411 Generalized anxiety disorder: Secondary | ICD-10-CM

## 2012-09-21 DIAGNOSIS — J449 Chronic obstructive pulmonary disease, unspecified: Secondary | ICD-10-CM

## 2012-09-21 DIAGNOSIS — K5730 Diverticulosis of large intestine without perforation or abscess without bleeding: Secondary | ICD-10-CM

## 2012-09-21 DIAGNOSIS — M109 Gout, unspecified: Secondary | ICD-10-CM

## 2012-09-21 DIAGNOSIS — R0600 Dyspnea, unspecified: Secondary | ICD-10-CM

## 2012-09-21 DIAGNOSIS — J4489 Other specified chronic obstructive pulmonary disease: Secondary | ICD-10-CM

## 2012-09-21 DIAGNOSIS — E785 Hyperlipidemia, unspecified: Secondary | ICD-10-CM

## 2012-09-21 DIAGNOSIS — I739 Peripheral vascular disease, unspecified: Secondary | ICD-10-CM

## 2012-09-21 DIAGNOSIS — I359 Nonrheumatic aortic valve disorder, unspecified: Secondary | ICD-10-CM

## 2012-09-21 DIAGNOSIS — K219 Gastro-esophageal reflux disease without esophagitis: Secondary | ICD-10-CM

## 2012-09-21 DIAGNOSIS — M810 Age-related osteoporosis without current pathological fracture: Secondary | ICD-10-CM

## 2012-09-21 DIAGNOSIS — K449 Diaphragmatic hernia without obstruction or gangrene: Secondary | ICD-10-CM

## 2012-09-21 LAB — BASIC METABOLIC PANEL
Calcium: 9.5 mg/dL (ref 8.4–10.5)
Creatinine, Ser: 0.8 mg/dL (ref 0.4–1.2)
GFR: 74.51 mL/min (ref >60.00)
Sodium: 140 mEq/L (ref 135–145)

## 2012-09-21 LAB — CBC WITH DIFFERENTIAL/PLATELET
Basophils Relative: 0.1 % (ref 0.0–3.0)
Eosinophils Absolute: 0 10*3/uL (ref 0.0–0.7)
Eosinophils Relative: 0 % (ref 0.0–5.0)
Hemoglobin: 14.3 g/dL (ref 12.0–15.0)
Lymphocytes Relative: 2.6 % — ABNORMAL LOW (ref 12.0–46.0)
MCHC: 34.1 g/dL (ref 30.0–36.0)
Monocytes Relative: 2.1 % — ABNORMAL LOW (ref 3.0–12.0)
Neutrophils Relative %: 95.2 % — ABNORMAL HIGH (ref 43.0–77.0)
RBC: 4.63 Mil/uL (ref 3.87–5.11)
WBC: 20.4 10*3/uL (ref 4.5–10.5)

## 2012-09-21 LAB — SEDIMENTATION RATE: Sed Rate: 43 mm/hr — ABNORMAL HIGH (ref 0–22)

## 2012-09-21 MED ORDER — LEVOFLOXACIN 500 MG PO TABS: 500.0000 mg | ORAL_TABLET | Freq: Every day | ORAL | Status: DC

## 2012-09-21 NOTE — Progress Notes (Signed)
Subjective:    Patient ID: Andrea Rubio, female    DOB: 1925-02-11, 78 y.o.   MRN: [id number]  HPI 79 y/o WF here for a follow up visit... she has multiple medical problems as noted below...   ~  May 27, 2011:  57mo ROV & Maven fell in her bathroom several weeks ago- hit the back of her head & left arm abrasion; EMS checked her & dressed her wound and she decided to see her local physician DrWElkins; he redressed the left arm abrasion & reassured her;  We rechecked her occipital area- sm scab ok to wash etc, and left arm abrasion healing nicely; She knows to be more careful to avoid falls etc... We reviewed her meds, problem list, & prev labs> see below>>    She tells me she saw [facility] for Cards f/u about a month ago> stable, no changes made (we don't get notes from him)...  ~  October 07, 2011:  57mo ROV     She saw Urology 7/13 for Estring change> this has been controlling her dysuria & is done every 90mo...    She is followed by Susann Givens for Ortho> L1 compression fx ~4/13; on Tramadol & Miacalcin NS, Vit D, & Calcium... We reviewed prob list, meds, xrays and labs> see below>> She requests refill of Nizoral shampoo- ok... LABS 9/13:  FLP- not at goals on Simva40, reminded to take everyday;  Chems- wnl;  CBC- wnl  ~  February 10, 2012:  57mo ROV & Andrea Rubio has had a refractory AB episode w/ persist cough & discolored mucus; now on Avelox & we reviewed the need to maximize her Rx w/ Pred 5mg /d, NEBS Tid, Advair250Bid, Mucinex-2Bid, Fluids, etc... BP controlled on Lisin + Lasix;  Cards followed by [id number];  She was started on Atorva20 but didn't bring her med bottles to review (as requested)...    We reviewed prob list, meds, xrays and labs> see below for updates >>   ~  March 14, 2012:  60mo ROV & Andrea Rubio has improved but not all the way resolved from her recent AB exac (see below); We reviewed the following medical problems during today's office visit >>     AB> she had AB exac last month &  Rx w/ Avelox, Pred, Nebs, Advair250, Mucinex, etc; she is improved but not back to baseline & we are forced to bump Pred to 20mg  tabs- 4d taper + NEBS Qid, max Mucinex, etc...    HBP> now on Cozaar25, Lasix40, K20; BP=122/82 & she denies CP, palpit, ch in SOB, edema, etc...    Cardiac> severeAS & CHF> followed by [id number], meds reviewed, we don't have notes from him...    Hyperlipid> states she is off the Lip20; she did not bring med list or bottles to review; asked to have Cards send notes to me & f/u FLP off meds...    HH, GERD, Divertics> on Prilosec20 & Metaclop10qhs; sister died w/ colon ca- pts last colonoscopy 6/06 by [facility] w/o polyps (+divertics & ischemic colitis)...    DJD, Gout, LBP> on Uloric40 & Tramadol50 prn; she has severe deformities & difficulty getting up & about; she desperately needs more PT but she refuses...    Osteoporosis> on Miacalcin NS;     Anxiety> she takes [id number] but declines anxiolytic rx... We reviewed prob list, meds, xrays and labs> see below for updates >>  LABS 2/14:  Chems- wnl;  CBC- wnl;  BNP= 427...   ~  April 20, 2012:  75mo ROV & Andrea Rubio has stabilized by bumping up Pred, now weaned back down to 5mg /d plus her max regimen... She is managing as best she can w/ help of her son at home; notes some pain in her left hip- known osteoporosis & BMD 3/14 showed TScores +0.7 in Spine, and -2.3 in left Fem Neck, and -1.9 in Forearm radius; she's been on the Miacalcin NS "it seems to be helping" she says, therefore continue the same for now...  BP stable, denies CP, palpit, ch in SOB etc... She has severe AS followed by [facility] but apparently there is not much he can do...    We reviewed prob list, meds, xrays and labs> see below for updates >> we will recheck her in 2-3 months...  ~  May 24, 2012:  75mo ROV & in the interval Andrea Rubio had yet another exac characterized by incr cough, yellow sput, congestion, wheezing & SOB; we called in Levaquin, then had to bump the  Pred back up (first to 10mg /d, then to 20mg /d);  She reports that she is a lot better now on the Pred20- "I'm better than I've been the whole time"- and we discussed a SLOW wean to try to determine her threshold dose> for now decr to 20 alt w/ 10mg  Qod & f/u 75mo...  She will also continue on her Miacalcin NS, Calcium, MVI, VitD 1000u/d...    We reviewed prob list, meds, xrays and labs> see below for updates >>   ~  Jun 22, 2012:  75mo ROV & Andrea Rubio has been on Pred20mg - taking one tab alt w/ 1/2 tab Qod for the last month; pt states "I'm a lot better, just can't get over the bronchitis from last fall";  She has lost 3# & edema is diminished- BP= 124/80, Chest exam w/ exp rhonchi & she is reminded to take the Mucinex regularly;  I gave her & son the option of continuing the Pred 20-10 vs wean further to 10mg /d & they prefer the latter (told it was ok to take an extra 10mg  Pred on any day she noted incr trouble breathing)...     We reviewed prob list, meds, xrays and labs> see below for updates >>   ~  July 27, 2012:  75mo ROV & Andrea Rubio reports that she is "doing pretty good", has cut down her Pred to 10mg /d & rarely needs the extra 10mg  on any given day; she does not like the hot weather; her son wonders if Singulair would help her & we agreed to write a new Rx for this as a trial;  We also rec adding Flutter valve therapy to aide in mucocilliary production...  BP is stable at 118/68 and O2 sat= 96% on room air...  ~  September 21, 2012:  35mo ROV & Andrea Rubio has had an exac w/ incr cough, change in phlegm to thick dark sput, and sl more SOB but no f/c/s, no CP, etc;  She tried singulair (per son's request) but no better therefore rec to stop this add-on med;  Asked to use Flutter valve and incr efforts at improving mucociliary function & sputum clearance but she never did & doesn't think she can do it effectively (?really);  Exam shows stable VS, sats 96% on RA, chest w/ bibasilar rhonchi;  CXR shows chr changes w/o acute  dis & Labs showed elev WBC=20K w/ elev sed=43;  We decided to treat w/ Levaquin empirically, bump Pred 10=>20/d, and continue vigorous efforts at mucus clearance.Marland KitchenMarland Kitchen  AB, COPD> on Pred, Nebs, Advair250, Mucinex, etc; see above>     HBP> now on Cozaar25, Lasix40, K20; BP=118/68 & she denies CP, palpit, ch in SOB, edema, etc...    Cardiac> severeAS & CHF> on ASA + above; followed by [id number]- we don't have notes from him...    Hyperlipid> Lip20- still on her list; she did not bring med list or bottles to review; asked to have Cards send notes to me & f/u FLP when able...    HH, GERD, Divertics> on Prilosec20 & Metaclop10qhs; sister died w/ colon ca- pts last colonoscopy 6/06 by [facility] w/o polyps (+divertics & ischemic colitis)...    DJD, Gout, LBP> on Uloric40 & Tramadol50 prn; she has severe deformities (bilat shoulder arthroplasties) & difficulty getting up & about; she desperately needs more PT but she refuses...    Osteoporosis> on Miacalcin NS, calcium, MVI, VitD; BMD 3/14 showed TScore -2.3 in left [facility]; continue same...    Anxiety> she takes [id number] but declines anxiolytic rx... We reviewed prob list, meds, xrays and labs> see below for updates >>  LABS 8/14:  Chems- ok x BS=134;  CBC- ok x WBC=20K;  BNP=484;  Sed=43;  Sput= NTF only... CXR 8/14 showed cardiomeg, atherosclerosis of Ao, chr changes but clear lungs/ NAD, bilat shoulder arthroplasties, compressionL1...           Problem List:    MACULAR DEGENERATION (ICD-362.50) - she is legally blind & son helps out at home...  SINUSITIS, CHRONIC (ICD-473.9) - she is s/p bliat [facility] 1986 by DrKraus... ~  CT Sinus 11/09 showed chr sinusitis changes in the max & sphenoid regions... ~  8/12: she is c/o drippy nose, trial Atrovent nasal spray...  ASTHMATIC BRONCHITIS, ACUTE (ICD-466.0) - on PRED 5mg /d, ADVAIR 250Bid, & ALBUTEROL NEBS Tid... stable- min cough, w/o sputum, no change in DOE, etc... ~  PFT's 3/06 showed FVC=  1.49 (68%), FEV1= 1.12 (75%), %1sec=75, mid-flows= 43%pred... ~  [facility] 8/09 w/ bilat pneumonia... see DC Summary. ~  CXR 10/09 showed stable cardiomeg, tort Ao, clear lungs, bilat shoulder arthroplasties... ~  CT Angio Chest 11/09 showed neg for PE, fluid/ mucus plugs in depend LL bronchi... ~  CXR 10/10 showed stable cardiomeg, bilat humeral head prostheses, NAD.Marland Kitchen. ~  CXR 1/12 showed COPD, chr changes, ?right basilar opacity ==> subsequent films w/ incr edema. ~  CXR 5/12 showed cardiomeg, sm right effusion, no edema, bilat shoulder arthroplasties, NAD... ~  10/12:  AB exac treated by TP w/ Augmentin & improved... ~  1/14:  AB exac treated w/ Avelox, Pred taper, NEBS qid, Advair250, Mucinex, etc... ~  CXR 1/14 showed borderline heart size, sm bilat effusions and basilar atx...  ~  3/14: back on Pred 5mg /d and stable once again=> then had yet another exac requiring Rx w/ Levaquin, incr Pred to 20mg /d... ~  4/14:  Much improved on Pred20/d & we decided to decr to 20-10 Qod & recheck 32mo... ~  5/14:  She appears stable & wants to attempt further Pred taper to 10mg /d w/ ROV 59mo=> stable continue the same... ~  8/14:  COPD exac treated w/ Levaquin, bump Pred to 20, continue max Rx=> CXR w/ chr changes, Sput=> NTF; pt reports improved on rx...  HYPERTENSION (ICD-401.9) >>  ~  8/12: her BP's have stabilized at home and measures 112/68 here today... she denies CP, palpit, syncope, ch in edema... ~  12/12:on Lisin5/d, Lasix40/d & K20/d; BP= 126/72 & clinically stable w/o CP, palpit, ch in  SOB, etc... ~  4/13: on Lisin5/d, Lasix40/d & K20/d; BP= 108/62 & usually sl better at home; still denies any CP, palpit, ch in SOB, etc... ~  2/14: now on Cozaar25, Lasix40, K20; BP=122/82 & she denies CP, palpit, ch in SOB, edema, etc ~  5/14: on Cozaar25, Lasix40, K20; BP=124/80 & she denies CP, palpit, ch in SOB, edema, etc... ~  8/14: on Cozaar25, Lasix40, K20; BP=118/68 & she denies CP, palpit, ch in SOB, edema,  etc  AORTIC STENOSIS (ICD-424.1)   << followed by ZOXWRUEAV for Cards >> Hx of CHF (ICD-428.0)  ~  2DEcho 3/06 showed mild AS/AI w/ mod incr AoV thickness & reduced leaflet excursion, mild MR, norm EF=55-65%...  ~  2DEcho 12/08 showed mod inc AoV thickness, mod reduced AV leaflet excursion, c/w mod AS, mild AI & mean grad= ;  norm LVF w/ EF= 65% and no wall motion abn etc... ~  2DEcho 1/12 in [place] showed severeAS & mildAI, mild conc LVH, norm sys func w/ EF=60-65%, HK at apex, grI DD... ~  EKG 1/12 showed NSR, poor R progression, NSSTTWA... ~  We do not have follow up notes from [facility], pt indicates no change in meds...  PERIPHERAL VASCULAR DISEASE (ICD-443.9) - on ASA 81mg /d... she can't walk enough to elicit discomfort, no rest pain or lesions... ~  ABI's 12/01 showed .88 on R, and .95 on L... ~  MRI showed small vessel dis & small infarct noted...  VENOUS INSUFFICIENCY (ICD-459.81) - on low sodium diet, Lasix, KCl...  HYPERLIPIDEMIA (ICD-272.4) - prev on Zocor 40mg  per WUJWJXBJY- off now;  (prev on Pravachol, but stated intol to other Statins)... takes Fish Oil ~2000mg /d rec by her ophthalmologist... ~  FLP 9/05 ?on Prav40 showed TChol 176, TG 190, HDL 55, LDL 83... ~  FLP 4/10 on diet alone showed TChol 211, TG 153, HDL 44, LDL 128... she refuses med Rx- continue diet efforts. ~  1/12:  Hosp by [facility] & consulted DrKadakia who started her on ZOCOR40mg /d> he is checking her labs now... ~  12/12: she crossed Simva40 off her list ?stopped on her own or ?stopped by [place]... ~  2013:  She still has Simva40 on her list here but NEVER brings a list from [place] or her med bottles to review... ~  9/13:  FLP here showed TChol 206, TG 167, HDL 48, LDL 133... She has agreed to try ATORVASTATIN 20mg /d... ~  2/14: she reports that she is off all statin meds, on diet alone... ~  8/14: med list includes Lip20- she did not bring meds to office, we have never seen notes from [place]...  HIATAL  HERNIA (ICD-553.3) - on PRILOSEC 20mg /d & METACLOPRAMIDE 10mg Qhs... GERD (ICD-530.81) - EGD 3/06 by DrStark showed 3cmHH, mild esophagitis...  DIVERTICULOSIS, COLON (ICD-562.10) - last colonoscopy was 6/06 by [facility] showed divertics and ischemic colitis...  ~  Adm 1/12 by [facility] w/ Diverticulitis, treated & improved, she has not followed up w/ GI; she take 1/2 capful of MIRALAX daily.  STRESS INCONTINENCE (ICD-788.39) - she has hx UTI's & dysuria on & off... ~  10/10: urine c/s +EColi & Rx'd w/ Cipro. ~  7/12: she reports vaginal estrogen per Urology has helped her urinary symptoms; note from DrOttelin reviewed. ~  Labs 12/12 w/ normal renal function & Creat=0.7 ~  10/13: she had f/u DrOttelin> Hx trophic vaginitis, dysuria, & recurrent UTIs; rec topical estrogen=> estring (he replaced it that day); PVR=100; he rec ROV in 1mo for estring replacement. ~  2/14: f/u post menopausal atrophic vaginitis, chr cystitis, on Estring but cannot replace it herself- seen Q33mo for this purpose...  DEGENERATIVE JOINT DISEASE (ICD-715.90) - she has severe DJD & Gout w/ deformities- Foot XRay showed Charcot foot deformity, severe degenerative dis, etc...  added TRAMADOL + TYLENOL Prn... GOUT, UNSPECIFIED (ICD-274.9) - labs 4/10 showed Uric Acid level= 7.9.Marland KitchenMarland Kitchen she has hx of gout & hyperuricemia but allergic to Allopurinol w/ rash... therefore on ULORIC 40mg /d... Uric Acid level 5/11 on Uloric40 = 3.2 LOW BACK PAIN (ICD-724.2) NECK PAIN (ICD-723.1) - eval by Susann Givens ==> DrLewitt 2010 w/ MRI & Rx  w/ Lidoderm, Tylenol, Lyrica- now on NEURONTIN... OSTEOPOROSIS (ICD-733.00) - prev on Fosamax (stopped 1/12 in hosp "my kidneys can't handle it"); on Ca++, Vits, & Vit D... ~  labs 4/10 showed Vit D level = 28... rec> start Vit D OTC 1000 u daily. ~  labs 5/11 showed Vit D level = 32... rec incr Vit D to 2000 u daily. ~  2013:  She fell w/ L1 compression & managed by Susann Givens w/ MIACALCIN NS & Tramadol... ~  3/14:  BMD here  showed TScores +0.7 in Spine, and -2.3 in left Fem Neck, and -1.9 in Forearm radius; she feels the Miacalcin is helping, therefore continue same...  ANXIETY (ICD-300.00) - off prev Alpraz Rx & takes AMITRIPTYLINE 25mg  Qhs... under stress w/ 63 y/o son had MI & stent in 2010.  Hx of ANEMIA (ICD-285.9) - hx anemia req 2U transfusion 6/06 hosp for ischemic colitis...  ~  Hg= 12 - 13 over 2008-9. ~  labs 8/09 hosp showed Hg= 14 ~  labs 4/10 showed Hg= 12.2 ~  labs 3/11 showed Hg= 12.5, MCV= 92, Fe= 22 (9%sat)... start FeSO4 Bid. ~  labs 5/11 showed = 13.0, MCV= 94, Fe= 57 (23%)... continue Fe supplement. ~  Labs 12/12 showed Hg= 13.1, Fe= 35, (14%sat)... rec to continue Fe+VitC Bid... ~  Labs 9/13 showed Hg= 13.7, MCV=94 ~  Labs 8/14 showed Hg= 14.3, MCV= 91  DERM>  She has skin cancer removed from the right side of her face by DrDanJones (Spring2012)...   Past Surgical History  Procedure Laterality Date  . Lumbar laminectomy    . Shoulder surgery      bilateral    Outpatient Encounter Prescriptions as of 09/21/2012  Medication Sig Dispense Refill  . albuterol (PROVENTIL) (2.5 MG/3ML) 0.083% nebulizer solution Take 2.5 mg by nebulization 4 (four) times daily.       Marland Kitchen amitriptyline (ELAVIL) 25 MG tablet TAKE ONE TABLET AT BEDTIME.  30 tablet  6  . aspirin 81 MG tablet Take 81 mg by mouth daily.        Marland Kitchen atorvastatin (LIPITOR) 20 MG tablet Take 1 tablet (20 mg total) by mouth daily.  30 tablet  PRN  . Azelastine-Fluticasone 137-50 MCG/ACT SUSP Place 1-2 sprays into the nose 2 (two) times daily as needed.  1 Bottle  5  . calcitonin, salmon, (MIACALCIN/FORTICAL) 200 UNIT/ACT nasal spray Place 1 spray into the nose daily.      . Calcium Carbonate-Vitamin D (CALCIUM 600+D) 600-400 MG-UNIT per tablet Take 1 tablet by mouth daily.       . Cholecalciferol (VITAMIN D) 1000 UNITS capsule Take 1,000 Units by mouth daily.        . clotrimazole-betamethasone (LOTRISONE) cream APPLY TO RASH TWICE  DAILY.  15 g  5  . conjugated estrogens (PREMARIN) vaginal cream daily.       . ferrous gluconate (  FERGON) 325 MG tablet Take 325 mg by mouth daily with breakfast.       . Fluticasone-Salmeterol (ADVAIR DISKUS) 250-50 MCG/DOSE AEPB Inhale 1 puff into the lungs 2 (two) times daily.  60 each  11  . furosemide (LASIX) 40 MG tablet TAKE 1 TABLET ONCE DAILY.  30 tablet  6  . Guaifenesin 1200 MG TB12 Take 1 tablet by mouth 2 (two) times daily.      Marland Kitchen ipratropium (ATROVENT) 0.06 % nasal spray USE 2 SPRAYS EACH NOSTRIL 4 TIMES A DAY.  15 mL  5  . ipratropium-albuterol (DUONEB) 0.5-2.5 (3) MG/3ML SOLN USE 1 VIAL IN NEBULIZER 3 TIMES DAILY.  270 mL  6  . ketoconazole (NIZORAL) 2 % shampoo Use as directed  120 mL  PRN  . losartan (COZAAR) 25 MG tablet TAKE 1 TABLET DAILY.  30 tablet  5  . metoCLOPramide (REGLAN) 10 MG tablet TAKE ONE TABLET AT BEDTIME.  30 tablet  6  . montelukast (SINGULAIR) 10 MG tablet Take 1 tablet (10 mg total) by mouth at bedtime.  30 tablet  11  . Multiple Vitamin (MULTIVITAMIN) capsule Take 1 capsule by mouth daily.        Marland Kitchen omeprazole (PRILOSEC) 20 MG capsule Take one tablet daily      . potassium chloride SA (K-DUR,KLOR-CON) 20 MEQ tablet TAKE 1 TABLET ONCE DAILY.  30 tablet  6  . predniSONE (DELTASONE) 20 MG tablet Take 1  tablet by mouth daily      . Respiratory Therapy Supplies (FLUTTER) DEVI Use as directed  1 each  0  . simvastatin (ZOCOR) 40 MG tablet Take 40 mg by mouth at bedtime.        Marland Kitchen ULORIC 40 MG tablet TAKE 1 TABLET EACH DAY.  30 tablet  6  . ULTRAM 50 MG tablet TAKE ONE TABLET EVERY 6 HOURS AS NEEDED FOR PAIN (MAY TAKE WITH TYLENOL).  90 each  5   No facility-administered encounter medications on file as of 09/21/2012.    Allergies  Allergen Reactions  . Allopurinol     REACTION: rash  . Codeine   . Hydrocodone     REACTION: hallucinations  . Morphine     REACTION: hallucinations    Current Medications, Allergies, Past Medical History, Past Surgical  History, Family History, and Social History were reviewed in [employer] record.    Review of Systems        See HPI - all other systems neg except as noted... The patient complains of dyspnea on exertion, peripheral edema, abdominal pain, indigestion/heartburn, muscle weakness, and difficulty walking.  The patient denies anorexia, fever, weight loss, weight gain, decreased hearing, hoarseness, chest pain, syncope,  headaches, hemoptysis, melena, hematochezia, hematuria, incontinence, suspicious skin lesions, depression, unusual weight change, abnormal bleeding, enlarged lymph nodes, and angioedema.     Objective:   Physical Exam    WD, Petite, sl overweight, 79 y/o WF in NAD... she is <5' tall, weight 130# GENERAL:  Alert & oriented; pleasant & cooperative... HEENT:  [place]/AT, EACs-clear, TMs-wnl, NOSE-clear, THROAT-clear & wnl... known macular degen per ophthal... NECK:  Supple w/ decrROM; no JVD; normal carotid impulses w/o bruits; no thyromegaly or nodules palpated; no lymphadenopathy. CHEST:  Decr BS bilat, Coarse BS, no wheezing or rhonchi... HEART:  Regular Rhythm;  gr 2/6 SEM without rubs or gallops detected... ABDOMEN:  Soft & nontender; normal bowel sounds; no organomegaly or masses palpated... EXT: +hand deformities, w/ tophi & mod arthritic  changes & contractures... right knee arthritis & severe foot deformities as well. no varicose veins/+venous insuffic/ tr edema. NEURO:  CN's intact x reduced vision... diffusely weak, without focal changes... +gait abn... DERM:  Abrasion over occiput & on left arm; +ecchymoses, thin skin, etc...  RADIOLOGY DATA:  Reviewed in the EPIC EMR & discussed w/ the patient...  LABORATORY DATA:  Reviewed in the EPIC EMR & discussed w/ the patient...   Assessment & Plan:    AB>  She has an AB exac & set back- we decided to Rx w/ LEVAQUIN, INCR PRED to 20, & continue other meds (except stop the singulair- not apparently  helpful); We discussed the need to avoid infections & maximize her home resp treatments...  HBP & Diastolic CHF>  Controlled on meds- Losatan & Lasix; continue same; she will f/u w/ ZOXWRUEAV as planned.  AS>  Followed by WUJWJXBJY for her severe AS, not a surg candidate...  HYPERLIPID>  Off Simva40 now ?per Cards or on her own; she will check w/ DrKadakia==> we don't have notes, apparently off all meds, she agrees to try ATORVASTATIN20=> stopped on her own...  GI>  Hx HH, GERD, Divertics, Colitis>  On Prilosec, Reglan 10mg  Qhs (she does not want to stop this med), & Miralax as noted...  GU>  Dysuria improved w/ vaginal cream (no estring) & it is changed Q88mo...  DJD/ Gout/ Neck&Back Pain>  As noted- on Tramadol, Tylenol, Uloric; she is weak in the legs, hard to stand & walk, I'm afraid she will become immobile, unable to stand or walk making self care difficult but she is unperturbed by this & refuses my rec for more home PT...  Osteoporosis>  BMD w/ TScore -2.3 in [facility]; she is on Miacalcin NS & feels this is helping- continue same...  Anxiety>  She has Amitriptyline 25Qhs, not on anxiolytic...   Patient's Medications  New Prescriptions   LEVOFLOXACIN (LEVAQUIN) 500 MG TABLET    Take 1 tablet (500 mg total) by mouth daily.  Previous Medications   ALBUTEROL (PROVENTIL) (2.5 MG/3ML) 0.083% NEBULIZER SOLUTION    Take 2.5 mg by nebulization 4 (four) times daily.    AMITRIPTYLINE (ELAVIL) 25 MG TABLET    TAKE ONE TABLET AT BEDTIME.   ASPIRIN 81 MG TABLET    Take 81 mg by mouth daily.     ATORVASTATIN (LIPITOR) 20 MG TABLET    Take 1 tablet (20 mg total) by mouth daily.   AZELASTINE-FLUTICASONE 137-50 MCG/ACT SUSP    Place 1-2 sprays into the nose 2 (two) times daily as needed.   CALCITONIN, SALMON, (MIACALCIN/FORTICAL) 200 UNIT/ACT NASAL SPRAY    Place 1 spray into the nose daily.   CALCIUM CARBONATE-VITAMIN D (CALCIUM 600+D) 600-400 MG-UNIT PER TABLET    Take 1 tablet by mouth daily.     CHOLECALCIFEROL (VITAMIN D) 1000 UNITS CAPSULE    Take 1,000 Units by mouth daily.     CLOTRIMAZOLE-BETAMETHASONE (LOTRISONE) CREAM    APPLY TO RASH TWICE DAILY.   CONJUGATED ESTROGENS (PREMARIN) VAGINAL CREAM    daily.    FERROUS GLUCONATE (FERGON) 325 MG TABLET    Take 325 mg by mouth daily with breakfast.    FLUTICASONE-SALMETEROL (ADVAIR DISKUS) 250-50 MCG/DOSE AEPB    Inhale 1 puff into the lungs 2 (two) times daily.   FUROSEMIDE (LASIX) 40 MG TABLET    TAKE 1 TABLET ONCE DAILY.   GUAIFENESIN 1200 MG TB12    Take 1 tablet by mouth 2 (two) times  daily.   IPRATROPIUM (ATROVENT) 0.06 % NASAL SPRAY    USE 2 SPRAYS EACH NOSTRIL 4 TIMES A DAY.   IPRATROPIUM-ALBUTEROL (DUONEB) 0.5-2.5 (3) MG/3ML SOLN    USE 1 VIAL IN NEBULIZER 3 TIMES DAILY.   KETOCONAZOLE (NIZORAL) 2 % SHAMPOO    Use as directed   LOSARTAN (COZAAR) 25 MG TABLET    TAKE 1 TABLET DAILY.   METOCLOPRAMIDE (REGLAN) 10 MG TABLET    TAKE ONE TABLET AT BEDTIME.   MULTIPLE VITAMIN (MULTIVITAMIN) CAPSULE    Take 1 capsule by mouth daily.     OMEPRAZOLE (PRILOSEC) 20 MG CAPSULE    Take one tablet daily   POTASSIUM CHLORIDE SA (K-DUR,KLOR-CON) 20 MEQ TABLET    TAKE 1 TABLET ONCE DAILY.   PREDNISONE (DELTASONE) 20 MG TABLET    Take 1  tablet by mouth daily   RESPIRATORY THERAPY SUPPLIES (FLUTTER) DEVI    Use as directed   ULORIC 40 MG TABLET    TAKE 1 TABLET EACH DAY.   ULTRAM 50 MG TABLET    TAKE ONE TABLET EVERY 6 HOURS AS NEEDED FOR PAIN (MAY TAKE WITH TYLENOL).  Modified Medications   No medications on file  Discontinued Medications   MONTELUKAST (SINGULAIR) 10 MG TABLET    Take 1 tablet (10 mg total) by mouth at bedtime.   SIMVASTATIN (ZOCOR) 40 MG TABLET    Take 40 mg by mouth at bedtime.

## 2012-09-21 NOTE — Patient Instructions (Addendum)
Today we updated your med list in our EPIC system...    Continue your current medications the same...  We are disappointed w/ the recent set -back (increase cough, thick dark sputum etc)    We wil, try for a sputum culture if possible...    Keep the Prednisone at the 20mg  dose for now...    It doesn't appear as though the Singulair helped, therefore ok to stop this med...    We will start LEVAQUIN 500mg  one tab daily x10d...  Today we checked a f/u CXR & blood work...    We will contact you w/ the results when available...   Please continue the vigorous efforts to produce & expectorate the phlegm...  Call for any questions...  Let's plan a follow up visit in 45mo, sooner if needed for problems.Marland KitchenMarland Kitchen

## 2012-09-22 ENCOUNTER — Encounter: Payer: Self-pay | Admitting: Pulmonary Disease

## 2012-09-24 LAB — RESPIRATORY CULTURE OR RESPIRATORY AND SPUTUM CULTURE

## 2012-09-30 ENCOUNTER — Telehealth: Payer: Self-pay | Admitting: Pulmonary Disease

## 2012-09-30 NOTE — Telephone Encounter (Signed)
Called and spoke with pts son billy and he is aware of sputum culture results. Nothing further is needed.

## 2012-09-30 NOTE — Telephone Encounter (Signed)
I spoke with the pt and spouse and they want to know sputum culture results. Results printed.  Please advise. Carron Curie, CMA

## 2012-10-11 ENCOUNTER — Other Ambulatory Visit: Payer: Self-pay | Admitting: Pulmonary Disease

## 2012-10-21 ENCOUNTER — Other Ambulatory Visit: Payer: Self-pay | Admitting: Pulmonary Disease

## 2012-10-24 ENCOUNTER — Ambulatory Visit (INDEPENDENT_AMBULATORY_CARE_PROVIDER_SITE_OTHER): Payer: Medicare PPO | Admitting: Pulmonary Disease

## 2012-10-24 ENCOUNTER — Encounter: Payer: Self-pay | Admitting: Pulmonary Disease

## 2012-10-24 VITALS — BP 110/72 | HR 88 | Temp 98.2°F | Ht 60.0 in | Wt 127.8 lb

## 2012-10-24 DIAGNOSIS — K449 Diaphragmatic hernia without obstruction or gangrene: Secondary | ICD-10-CM

## 2012-10-24 DIAGNOSIS — F411 Generalized anxiety disorder: Secondary | ICD-10-CM

## 2012-10-24 DIAGNOSIS — I872 Venous insufficiency (chronic) (peripheral): Secondary | ICD-10-CM

## 2012-10-24 DIAGNOSIS — I10 Essential (primary) hypertension: Secondary | ICD-10-CM

## 2012-10-24 DIAGNOSIS — J449 Chronic obstructive pulmonary disease, unspecified: Secondary | ICD-10-CM

## 2012-10-24 DIAGNOSIS — Z23 Encounter for immunization: Secondary | ICD-10-CM

## 2012-10-24 DIAGNOSIS — I359 Nonrheumatic aortic valve disorder, unspecified: Secondary | ICD-10-CM

## 2012-10-24 DIAGNOSIS — K219 Gastro-esophageal reflux disease without esophagitis: Secondary | ICD-10-CM

## 2012-10-24 DIAGNOSIS — I739 Peripheral vascular disease, unspecified: Secondary | ICD-10-CM

## 2012-10-24 DIAGNOSIS — M810 Age-related osteoporosis without current pathological fracture: Secondary | ICD-10-CM

## 2012-10-24 DIAGNOSIS — M1990 Unspecified osteoarthritis, unspecified site: Secondary | ICD-10-CM

## 2012-10-24 MED ORDER — CEPHALEXIN 500 MG PO CAPS: ORAL_CAPSULE | ORAL | Status: DC

## 2012-10-24 NOTE — Patient Instructions (Addendum)
Today we updated your med list in our EPIC system...    Continue your current medications the same...  Decrease the PREDNISONE 10mg  tabs to 20mg  one day alternate w/ 10mg  the next day (20-10-20-10)...  We decided to use an additional antibiotic- KEFLEX 500mg  one tab three times daily x 10d til gone...  Be sure to get on a PROBIOTIC like ALIGN daily...    And take the Activia yogurt as well...  Be diligent w/ your NEBULIZER treatments, & the MUCINEX/ Fluids...    Try the POSTURAL DRAINAGE techniques we discussed...  We gave you the 2014 FLU vaccine today...  Let's plan a follow up visit in one month.Marland KitchenMarland Kitchen

## 2012-10-24 NOTE — Progress Notes (Signed)
Subjective:    Patient ID: Andrea Rubio, female    DOB: 12/27/1925, 79 y.o.   MRN: [id number]  HPI 79 y/o WF here for a follow up visit... she has multiple medical problems as noted below...   ~  May 27, 2011:  345mo ROV & Andrea Rubio fell in her bathroom several weeks ago- hit the back of her head & left arm abrasion; EMS checked her & dressed her wound and she decided to see her local physician DrWElkins; he redressed the left arm abrasion & reassured her;  We rechecked her occipital area- sm scab ok to wash etc, and left arm abrasion healing nicely; She knows to be more careful to avoid falls etc... We reviewed her meds, problem list, & prev labs> see below>>    She tells me she saw [facility] for [facility] f/u about a month ago> stable, no changes made (we don't get notes from him)...  ~  October 07, 2011:  345mo ROV     She saw Urology 7/13 for Estring change> this has been controlling her dysuria & is done every 45mo...    She is followed by Susann Givens for Ortho> L1 compression fx ~4/13; on Tramadol & Miacalcin NS, Vit D, & Calcium... We reviewed prob list, meds, xrays and labs> see below>> She requests refill of Nizoral shampoo- ok... LABS 9/13:  FLP- not at goals on Simva40, reminded to take everyday;  Chems- wnl;  CBC- wnl  ~  February 10, 2012:  345mo ROV & Andrea Rubio has had a refractory AB episode w/ persist cough & discolored mucus; now on Avelox & we reviewed the need to maximize her Rx w/ Pred 5mg /d, NEBS Tid, Advair250Bid, Mucinex-2Bid, Fluids, etc... BP controlled on Lisin + Lasix;  Cards followed by [id number];  She was started on Atorva20 but didn't bring her med bottles to review (as requested)...    We reviewed prob list, meds, xrays and labs> see below for updates >>   ~  March 14, 2012:  43mo ROV & Andrea Rubio has improved but not all the way resolved from her recent AB exac (see below); We reviewed the following medical problems during today's office visit >>     AB> she had AB exac last month &  Rx w/ Avelox, Pred, Nebs, Advair250, Mucinex, etc; she is improved but not back to baseline & we are forced to bump Pred to 20mg  tabs- 4d taper + NEBS Qid, max Mucinex, etc...    HBP> now on Cozaar25, Lasix40, K20; BP=122/82 & she denies CP, palpit, ch in SOB, edema, etc...    Cardiac> severeAS & CHF> followed by YQMVHQION, meds reviewed, we don't have notes from him...    Hyperlipid> states she is off the Lip20; she did not bring med list or bottles to review; asked to have Cards send notes to me & f/u FLP off meds...    HH, GERD, Divertics> on Prilosec20 & Metaclop10qhs; sister died w/ colon ca- pts last colonoscopy 6/06 by [facility] w/o polyps (+divertics & ischemic colitis)...    DJD, Gout, LBP> on Uloric40 & Tramadol50 prn; she has severe deformities & difficulty getting up & about; she desperately needs more PT but she refuses...    Osteoporosis> on Miacalcin NS;     Anxiety> she takes [id number] but declines anxiolytic rx... We reviewed prob list, meds, xrays and labs> see below for updates >>  LABS 2/14:  Chems- wnl;  CBC- wnl;  BNP= 427...   ~  April 20, 2012:  75mo ROV & Andrea Rubio has stabilized by bumping up Pred, now weaned back down to 5mg /d plus her max regimen... She is managing as best she can w/ help of her son at home; notes some pain in her left hip- known osteoporosis & BMD 3/14 showed TScores +0.7 in Spine, and -2.3 in left Fem Neck, and -1.9 in Forearm radius; she's been on the Miacalcin NS "it seems to be helping" she says, therefore continue the same for now...  BP stable, denies CP, palpit, ch in SOB etc... She has severe AS followed by [facility] but apparently there is not much he can do...    We reviewed prob list, meds, xrays and labs> see below for updates >> we will recheck her in 2-3 months...  ~  May 24, 2012:  75mo ROV & in the interval Andrea Rubio had yet another exac characterized by incr cough, yellow sput, congestion, wheezing & SOB; we called in Levaquin, then had to bump the  Pred back up (first to 10mg /d, then to 20mg /d);  She reports that she is a lot better now on the Pred20- "I'm better than I've been the whole time"- and we discussed a SLOW wean to try to determine her threshold dose> for now decr to 20 alt w/ 10mg  Qod & f/u 75mo...  She will also continue on her Miacalcin NS, Calcium, MVI, VitD 1000u/d...    We reviewed prob list, meds, xrays and labs> see below for updates >>   ~  Jun 22, 2012:  75mo ROV & Andrea Rubio has been on Pred20mg - taking one tab alt w/ 1/2 tab Qod for the last month; pt states "I'm a lot better, just can't get over the bronchitis from last fall";  She has lost 3# & edema is diminished- BP= 124/80, Chest exam w/ exp rhonchi & she is reminded to take the Mucinex regularly;  I gave her & son the option of continuing the Pred 20-10 vs wean further to 10mg /d & they prefer the latter (told it was ok to take an extra 10mg  Pred on any day she noted incr trouble breathing)...     We reviewed prob list, meds, xrays and labs> see below for updates >>   ~  July 27, 2012:  75mo ROV & Andrea Rubio reports that she is "doing pretty good", has cut down her Pred to 10mg /d & rarely needs the extra 10mg  on any given day; she does not like the hot weather; her son wonders if Singulair would help her & we agreed to write a new Rx for this as a trial;  We also rec adding Flutter valve therapy to aide in mucocilliary production...  BP is stable at 118/68 and O2 sat= 96% on room air...  ~  September 21, 2012:  35mo ROV & Andrea Rubio has had an exac w/ incr cough, change in phlegm to thick dark sput, and sl more SOB but no f/c/s, no CP, etc;  She tried singulair (per son's request) but no better therefore rec to stop this add-on med;  Asked to use Flutter valve and incr efforts at improving mucociliary function & sputum clearance but she never did & doesn't think she can do it effectively (?really);  Exam shows stable VS, sats 96% on RA, chest w/ bibasilar rhonchi;  CXR shows chr changes w/o acute  dis & Labs showed elev WBC=20K w/ elev sed=43;  We decided to treat w/ Levaquin empirically, bump Pred 10=>20/d, and continue vigorous efforts at mucus clearance.Marland KitchenMarland Kitchen  AB, COPD> on Pred, Nebs, Advair250, Mucinex, etc; see above>     HBP> now on Cozaar25, Lasix40, K20; BP=118/68 & she denies CP, palpit, ch in SOB, edema, etc...    Cardiac> severeAS & CHF> on ASA + above; followed by ZOXWRUEAV- we don't have notes from him...    Hyperlipid> Lip20- still on her list; she did not bring med list or bottles to review; asked to have Cards send notes to me & f/u FLP when able...    HH, GERD, Divertics> on Prilosec20 & Metaclop10qhs; sister died w/ colon ca- pts last colonoscopy 6/06 by [facility] w/o polyps (+divertics & ischemic colitis)...    DJD, Gout, LBP> on Uloric40 & Tramadol50 prn; she has severe deformities (bilat shoulder arthroplasties) & difficulty getting up & about; she desperately needs more PT but she refuses...    Osteoporosis> on Miacalcin NS, calcium, MVI, VitD; BMD 3/14 showed TScore -2.3 in left [facility]; continue same...    Anxiety> she takes [id number] but declines anxiolytic rx... We reviewed prob list, meds, xrays and labs> see below for updates >>  LABS 8/14:  Chems- ok x BS=134;  CBC- ok x WBC=20K;  BNP=484;  Sed=43;  Sput= NTF only... CXR 8/14 showed cardiomeg, atherosclerosis of Ao, chr changes but clear lungs/ NAD, bilat shoulder arthroplasties, compressionL1...   ~  October 24, 2012:  75mo ROV & Andrea Rubio indicates that she improved on the Levaquin & w/ incr in Pred to 20mg /d after last OV but notes that her bronchitis symptoms returned w/ cough & brown sputum by her hx;  We decided to treat w/ Keflex 500mg Tid & try to decr the Pred to 20mg  alt w/ 10mg  Qod... Follow up 75mo recheck...    We reviewed prob list, meds, xrays and labs> see below for updates >>           Problem List:    MACULAR DEGENERATION (ICD-362.50) - she is legally blind & son helps out at home...  SINUSITIS,  CHRONIC (ICD-473.9) - she is s/p bliat [facility] 1986 by DrKraus... ~  CT Sinus 11/09 showed chr sinusitis changes in the max & sphenoid regions... ~  8/12: she is c/o drippy nose, trial Atrovent nasal spray...  ASTHMATIC BRONCHITIS, ACUTE (ICD-466.0) - on PRED 5mg /d, ADVAIR 250Bid, & ALBUTEROL NEBS Tid... stable- min cough, w/o sputum, no change in DOE, etc... ~  PFT's 3/06 showed FVC= 1.49 (68%), FEV1= 1.12 (75%), %1sec=75, mid-flows= 43%pred... ~  [facility] 8/09 w/ bilat pneumonia... see DC Summary. ~  CXR 10/09 showed stable cardiomeg, tort Ao, clear lungs, bilat shoulder arthroplasties... ~  CT Angio Chest 11/09 showed neg for PE, fluid/ mucus plugs in depend LL bronchi... ~  CXR 10/10 showed stable cardiomeg, bilat humeral head prostheses, NAD.Marland Kitchen. ~  CXR 1/12 showed COPD, chr changes, ?right basilar opacity ==> subsequent films w/ incr edema. ~  CXR 5/12 showed cardiomeg, sm right effusion, no edema, bilat shoulder arthroplasties, NAD... ~  10/12:  AB exac treated by TP w/ Augmentin & improved... ~  1/14:  AB exac treated w/ Avelox, Pred taper, NEBS qid, Advair250, Mucinex, etc... ~  CXR 1/14 showed borderline heart size, sm bilat effusions and basilar atx...  ~  3/14: back on Pred 5mg /d and stable once again=> then had yet another exac requiring Rx w/ Levaquin, incr Pred to 20mg /d... ~  4/14:  Much improved on Pred20/d & we decided to decr to 20-10 Qod & recheck 75mo... ~  5/14:  She appears stable & wants to  attempt further Pred taper to 10mg /d w/ ROV 66mo=> stable continue the same... ~  8/14:  COPD exac treated w/ Levaquin, bump Pred to 20, continue max Rx=> CXR w/ chr changes, Sput=> NTF; pt reports improved on rx... ~  9/14:  She reports initially better then w/ recurrent cough, dark sputum, no CP/ ch in SOB, f/c/s; we decided to treat again w/ Keflex 500mg  Tid & try to wean the Pred 20-10 Qod.  HYPERTENSION (ICD-401.9) >>  ~  8/12: her BP's have stabilized at home and measures  112/68 here today... she denies CP, palpit, syncope, ch in edema... ~  12/12:on Lisin5/d, Lasix40/d & K20/d; BP= 126/72 & clinically stable w/o CP, palpit, ch in SOB, etc... ~  4/13: on Lisin5/d, Lasix40/d & K20/d; BP= 108/62 & usually sl better at home; still denies any CP, palpit, ch in SOB, etc... ~  2/14: now on Cozaar25, Lasix40, K20; BP=122/82 & she denies CP, palpit, ch in SOB, edema, etc ~  5/14: on Cozaar25, Lasix40, K20; BP=124/80 & she denies CP, palpit, ch in SOB, edema, etc... ~  8/14: on Cozaar25, Lasix40, K20; BP=118/68 & she denies CP, palpit, ch in SOB, edema, etc... ~  9/14: on same meds- BP=110/72  AORTIC STENOSIS (ICD-424.1)   << followed by ZOXWRUEAV for Cards >> Hx of CHF (ICD-428.0)  ~  2DEcho 3/06 showed mild AS/AI w/ mod incr AoV thickness & reduced leaflet excursion, mild MR, norm EF=55-65%...  ~  2DEcho 12/08 showed mod inc AoV thickness, mod reduced AV leaflet excursion, c/w mod AS, mild AI & mean grad= ;  norm LVF w/ EF= 65% and no wall motion abn etc... ~  2DEcho 1/12 in [place] showed severeAS & mildAI, mild conc LVH, norm sys func w/ EF=60-65%, HK at apex, grI DD... ~  EKG 1/12 showed NSR, poor R progression, NSSTTWA... ~  We do not have follow up notes from [facility], pt indicates no change in meds...  PERIPHERAL VASCULAR DISEASE (ICD-443.9) - on ASA 81mg /d... she can't walk enough to elicit discomfort, no rest pain or lesions... ~  ABI's 12/01 showed .88 on R, and .95 on L... ~  MRI showed small vessel dis & small infarct noted...  VENOUS INSUFFICIENCY (ICD-459.81) - on low sodium diet, Lasix, KCl...  HYPERLIPIDEMIA (ICD-272.4) - prev on Zocor 40mg  per WUJWJXBJY- off now;  (prev on Pravachol, but stated intol to other Statins)... takes Fish Oil ~2000mg /d rec by her ophthalmologist... ~  FLP 9/05 ?on Prav40 showed TChol 176, TG 190, HDL 55, LDL 83... ~  FLP 4/10 on diet alone showed TChol 211, TG 153, HDL 44, LDL 128... she refuses med Rx- continue diet  efforts. ~  1/12:  Hosp by [facility] & consulted DrKadakia who started her on ZOCOR40mg /d> he is checking her labs now... ~  12/12: she crossed Simva40 off her list ?stopped on her own or ?stopped by [place]... ~  2013:  She still has Simva40 on her list here but NEVER brings a list from [place] or her med bottles to review... ~  9/13:  FLP here showed TChol 206, TG 167, HDL 48, LDL 133... She has agreed to try ATORVASTATIN 20mg /d... ~  2/14: she reports that she is off all statin meds, on diet alone... ~  8/14: med list includes Lip20- she did not bring meds to office, we have never seen notes from [place]...  HIATAL HERNIA (ICD-553.3) - on PRILOSEC 20mg /d & METACLOPRAMIDE 10mg Qhs... GERD (ICD-530.81) - EGD 3/06 by DrStark showed 3cmHH,  mild esophagitis...  DIVERTICULOSIS, COLON (ICD-562.10) - last colonoscopy was 6/06 by [facility] showed divertics and ischemic colitis...  ~  Adm 1/12 by [facility] w/ Diverticulitis, treated & improved, she has not followed up w/ GI; she take 1/2 capful of MIRALAX daily.  STRESS INCONTINENCE (ICD-788.39) - she has hx UTI's & dysuria on & off... ~  10/10: urine c/s +EColi & Rx'd w/ Cipro. ~  7/12: she reports vaginal estrogen per Urology has helped her urinary symptoms; note from DrOttelin reviewed. ~  Labs 12/12 w/ normal renal function & Creat=0.7 ~  10/13: she had f/u DrOttelin> Hx trophic vaginitis, dysuria, & recurrent UTIs; rec topical estrogen=> estring (he replaced it that day); PVR=100; he rec ROV in 68mo for estring replacement. ~  2/14 & 5/14: f/u post menopausal atrophic vaginitis, chr cystitis, on Estring but cannot replace it herself- seen Q768mo for this purpose... ~  8/14: she saw Urology for belated estring change, hx atrophic vaginitis, chr cystitis & dysuria...   DEGENERATIVE JOINT DISEASE (ICD-715.90) - she has severe DJD & Gout w/ deformities- Foot XRay showed Charcot foot deformity, severe degenerative dis, etc...  added TRAMADOL + TYLENOL Prn... GOUT,  UNSPECIFIED (ICD-274.9) - labs 4/10 showed Uric Acid level= 7.9.Marland KitchenMarland Kitchen she has hx of gout & hyperuricemia but allergic to Allopurinol w/ rash... therefore on ULORIC 40mg /d... Uric Acid level 5/11 on Uloric40 = 3.2 LOW BACK PAIN (ICD-724.2) NECK PAIN (ICD-723.1) - eval by Susann Givens ==> DrLewitt 2010 w/ MRI & Rx  w/ Lidoderm, Tylenol, Lyrica- now on NEURONTIN... OSTEOPOROSIS (ICD-733.00) - prev on Fosamax (stopped 1/12 in hosp "my kidneys can't handle it"); on Ca++, Vits, & Vit D... ~  labs 4/10 showed Vit D level = 28... rec> start Vit D OTC 1000 u daily. ~  labs 5/11 showed Vit D level = 32... rec incr Vit D to 2000 u daily. ~  2013:  She fell w/ L1 compression & managed by Susann Givens w/ MIACALCIN NS & Tramadol... ~  3/14:  BMD here showed TScores +0.7 in Spine, and -2.3 in left Fem Neck, and -1.9 in Forearm radius; she feels the Miacalcin is helping, therefore continue same...  ANXIETY (ICD-300.00) - off prev Alpraz Rx & takes AMITRIPTYLINE 25mg  Qhs... under stress w/ 55 y/o son had MI & stent in 2010.  Hx of ANEMIA (ICD-285.9) - hx anemia req 2U transfusion 6/06 hosp for ischemic colitis...  ~  Hg= 12 - 13 over 2008-9. ~  labs 8/09 hosp showed Hg= 14 ~  labs 4/10 showed Hg= 12.2 ~  labs 3/11 showed Hg= 12.5, MCV= 92, Fe= 22 (9%sat)... start FeSO4 Bid. ~  labs 5/11 showed = 13.0, MCV= 94, Fe= 57 (23%)... continue Fe supplement. ~  Labs 12/12 showed Hg= 13.1, Fe= 35, (14%sat)... rec to continue Fe+VitC Bid... ~  Labs 9/13 showed Hg= 13.7, MCV=94 ~  Labs 8/14 showed Hg= 14.3, MCV= 91  DERM>  She has skin cancer removed from the right side of her face by DrDanJones (Spring2012)...   Past Surgical History  Procedure Laterality Date  . Lumbar laminectomy    . Shoulder surgery      bilateral    Outpatient Encounter Prescriptions as of 10/24/2012  Medication Sig Dispense Refill  . albuterol (PROVENTIL) (2.5 MG/3ML) 0.083% nebulizer solution Take 2.5 mg by nebulization 4 (four) times daily.       Marland Kitchen  amitriptyline (ELAVIL) 25 MG tablet TAKE ONE TABLET AT BEDTIME.  30 tablet  6  . aspirin 81 MG tablet  Take 81 mg by mouth daily.        Marland Kitchen atorvastatin (LIPITOR) 20 MG tablet TAKE 1 TABLET ONCE DAILY.  30 tablet  PRN  . Azelastine-Fluticasone 137-50 MCG/ACT SUSP Place 1-2 sprays into the nose 2 (two) times daily as needed.  1 Bottle  5  . calcitonin, salmon, (MIACALCIN/FORTICAL) 200 UNIT/ACT nasal spray Place 1 spray into the nose daily.      . Calcium Carbonate-Vitamin D (CALCIUM 600+D) 600-400 MG-UNIT per tablet Take 1 tablet by mouth daily.       . Cholecalciferol (VITAMIN D) 1000 UNITS capsule Take 1,000 Units by mouth daily.        . clotrimazole-betamethasone (LOTRISONE) cream APPLY TO RASH TWICE DAILY.  15 g  5  . conjugated estrogens (PREMARIN) vaginal cream daily.       . ferrous gluconate (FERGON) 325 MG tablet Take 325 mg by mouth daily with breakfast.       . Fluticasone-Salmeterol (ADVAIR DISKUS) 250-50 MCG/DOSE AEPB Inhale 1 puff into the lungs 2 (two) times daily.  60 each  11  . furosemide (LASIX) 40 MG tablet TAKE 1 TABLET ONCE DAILY.  30 tablet  6  . Guaifenesin 1200 MG TB12 Take 1 tablet by mouth 2 (two) times daily.      Marland Kitchen ipratropium (ATROVENT) 0.06 % nasal spray USE 2 SPRAYS EACH NOSTRIL 4 TIMES A DAY.  15 mL  5  . ipratropium-albuterol (DUONEB) 0.5-2.5 (3) MG/3ML SOLN USE 1 VIAL IN NEBULIZER 3 TIMES DAILY.  270 mL  6  . ketoconazole (NIZORAL) 2 % shampoo Use as directed  120 mL  PRN  . losartan (COZAAR) 25 MG tablet TAKE 1 TABLET DAILY.  30 tablet  5  . metoCLOPramide (REGLAN) 10 MG tablet TAKE ONE TABLET AT BEDTIME.  30 tablet  6  . Multiple Vitamin (MULTIVITAMIN) capsule Take 1 capsule by mouth daily.        Marland Kitchen omeprazole (PRILOSEC) 20 MG capsule TAKE 1 TABLET ONCE DAILY.  30 capsule  6  . potassium chloride SA (K-DUR,KLOR-CON) 20 MEQ tablet TAKE 1 TABLET ONCE DAILY.  30 tablet  6  . predniSONE (DELTASONE) 20 MG tablet Take 1  tablet by mouth daily      . Respiratory  Therapy Supplies (FLUTTER) DEVI Use as directed  1 each  0  . ULORIC 40 MG tablet TAKE 1 TABLET EACH DAY.  30 tablet  6  . ULTRAM 50 MG tablet TAKE ONE TABLET EVERY 6 HOURS AS NEEDED FOR PAIN (MAY TAKE WITH TYLENOL).  90 each  5  . [DISCONTINUED] levofloxacin (LEVAQUIN) 500 MG tablet Take 1 tablet (500 mg total) by mouth daily.  10 tablet  0   No facility-administered encounter medications on file as of 10/24/2012.    Allergies  Allergen Reactions  . Allopurinol     REACTION: rash  . Codeine   . Hydrocodone     REACTION: hallucinations  . Morphine     REACTION: hallucinations    Current Medications, Allergies, Past Medical History, Past Surgical History, Family History, and Social History were reviewed in [employer] record.    Review of Systems        See HPI - all other systems neg except as noted... The patient complains of dyspnea on exertion, peripheral edema, abdominal pain, indigestion/heartburn, muscle weakness, and difficulty walking.  The patient denies anorexia, fever, weight loss, weight gain, decreased hearing, hoarseness, chest pain, syncope,  headaches, hemoptysis, melena, hematochezia, hematuria,  incontinence, suspicious skin lesions, depression, unusual weight change, abnormal bleeding, enlarged lymph nodes, and angioedema.     Objective:   Physical Exam    WD, Petite, sl overweight, 79 y/o WF in NAD... she is <5' tall, weight 130# GENERAL:  Alert & oriented; pleasant & cooperative... HEENT:  [place]/AT, EACs-clear, TMs-wnl, NOSE-clear, THROAT-clear & wnl... known macular degen per ophthal... NECK:  Supple w/ decrROM; no JVD; normal carotid impulses w/o bruits; no thyromegaly or nodules palpated; no lymphadenopathy. CHEST:  Decr BS bilat, Coarse BS, no wheezing or rhonchi... HEART:  Regular Rhythm;  gr 2/6 SEM without rubs or gallops detected... ABDOMEN:  Soft & nontender; normal bowel sounds; no organomegaly or masses palpated... EXT: +hand  deformities, w/ tophi & mod arthritic changes & contractures... right knee arthritis & severe foot deformities as well. no varicose veins/+venous insuffic/ tr edema. NEURO:  CN's intact x reduced vision... diffusely weak, without focal changes... +gait abn... DERM:  Abrasion over occiput & on left arm; +ecchymoses, thin skin, etc...  RADIOLOGY DATA:  Reviewed in the EPIC EMR & discussed w/ the patient...  LABORATORY DATA:  Reviewed in the EPIC EMR & discussed w/ the patient...   Assessment & Plan:    AB>  She has an AB exac & refractory symptoms- we decided to Rx w/ KEFLEX this time, weanPRED to 20-10 qod, & continue other meds (except stop the singulair- not apparently helpful); We discussed the need to avoid infections & maximize her home resp treatments...  HBP & Diastolic CHF>  Controlled on meds- Losatan & Lasix; continue same; she will f/u w/ [id number] as planned.  AS>  Followed by [id number] for her severe AS, not a surg candidate...  HYPERLIPID>  Off Simva40 now ?per Cards or on her own; she will check w/ DrKadakia==> we don't have notes, apparently off all meds, she agrees to try ATORVASTATIN20=> stopped on her own...  GI>  Hx HH, GERD, Divertics, Colitis>  On Prilosec, Reglan 10mg  Qhs (she does not want to stop this med), & Miralax as noted...  GU>  Dysuria improved w/ vaginal cream (no estring) & it is changed Q3mo...  DJD/ Gout/ Neck&Back Pain>  As noted- on Tramadol, Tylenol, Uloric; she is weak in the legs, hard to stand & walk, I'm afraid she will become immobile, unable to stand or walk making self care difficult but she is unperturbed by this & refuses my rec for more home PT...  Osteoporosis>  BMD w/ TScore -2.3 in [facility]; she is on Miacalcin NS & feels this is helping- continue same...  Anxiety>  She has Amitriptyline 25Qhs, not on anxiolytic.Marland KitchenMarland Kitchen

## 2012-10-26 ENCOUNTER — Ambulatory Visit: Payer: Medicare PPO | Admitting: Pulmonary Disease

## 2012-11-07 ENCOUNTER — Telehealth: Payer: Self-pay | Admitting: Pulmonary Disease

## 2012-11-07 MED ORDER — PREDNISONE 20 MG PO TABS: ORAL_TABLET | ORAL | Status: DC

## 2012-11-07 NOTE — Telephone Encounter (Signed)
I spoke with pt son. Aware RX has been sent and pt is alternating 10 and 20 mg. Nothing further needed

## 2012-11-18 ENCOUNTER — Other Ambulatory Visit: Payer: Self-pay | Admitting: Pulmonary Disease

## 2012-11-29 ENCOUNTER — Telehealth: Payer: Self-pay | Admitting: Pulmonary Disease

## 2012-11-29 MED ORDER — FLUTICASONE-SALMETEROL 250-50 MCG/DOSE IN AEPB: 1.0000 | INHALATION_SPRAY | Freq: Two times a day (BID) | RESPIRATORY_TRACT | Status: AC

## 2012-11-29 MED ORDER — ALBUTEROL SULFATE (2.5 MG/3ML) 0.083% IN NEBU: 2.5000 mg | INHALATION_SOLUTION | Freq: Four times a day (QID) | RESPIRATORY_TRACT | Status: DC

## 2012-11-29 NOTE — Telephone Encounter (Signed)
I spoke with the pt and she states she needs a refill on advair sent to pharmacy and a 30 day supply of albuterol as well until it comes through mail. Rx sent. Pt is aware.Carron Curie, CMA

## 2012-12-07 ENCOUNTER — Encounter: Payer: Self-pay | Admitting: Pulmonary Disease

## 2012-12-07 ENCOUNTER — Ambulatory Visit (INDEPENDENT_AMBULATORY_CARE_PROVIDER_SITE_OTHER): Payer: Medicare PPO | Admitting: Pulmonary Disease

## 2012-12-07 VITALS — BP 120/76 | HR 82 | Temp 98.3°F | Ht 60.0 in | Wt 129.8 lb

## 2012-12-07 DIAGNOSIS — I739 Peripheral vascular disease, unspecified: Secondary | ICD-10-CM

## 2012-12-07 DIAGNOSIS — I10 Essential (primary) hypertension: Secondary | ICD-10-CM

## 2012-12-07 DIAGNOSIS — M810 Age-related osteoporosis without current pathological fracture: Secondary | ICD-10-CM

## 2012-12-07 DIAGNOSIS — J449 Chronic obstructive pulmonary disease, unspecified: Secondary | ICD-10-CM

## 2012-12-07 DIAGNOSIS — I359 Nonrheumatic aortic valve disorder, unspecified: Secondary | ICD-10-CM

## 2012-12-07 DIAGNOSIS — M1990 Unspecified osteoarthritis, unspecified site: Secondary | ICD-10-CM

## 2012-12-07 DIAGNOSIS — M545 Low back pain: Secondary | ICD-10-CM

## 2012-12-07 DIAGNOSIS — K449 Diaphragmatic hernia without obstruction or gangrene: Secondary | ICD-10-CM

## 2012-12-07 DIAGNOSIS — K219 Gastro-esophageal reflux disease without esophagitis: Secondary | ICD-10-CM

## 2012-12-07 MED ORDER — PREDNISONE 20 MG PO TABS: ORAL_TABLET | ORAL | Status: DC

## 2012-12-07 NOTE — Patient Instructions (Signed)
Today we updated your med list in our EPIC system...    Continue your current medications the same...  We refilled your Prednisone 20mg  tabs today...    Try to keep the dose at 20mg  one day alternating w/ 10mg  the next day (20-10-20-10)...  Keep up the good work w/ the [employer], Mucinex, & postural drainage...  Call for any questions...  Let's plan a follow up visit in about 6wks, sooner if needed for problems.Marland KitchenMarland Kitchen

## 2012-12-11 ENCOUNTER — Telehealth: Payer: Self-pay | Admitting: Pulmonary Disease

## 2012-12-11 ENCOUNTER — Encounter (HOSPITAL_COMMUNITY): Payer: Self-pay | Admitting: Emergency Medicine

## 2012-12-11 ENCOUNTER — Inpatient Hospital Stay (HOSPITAL_COMMUNITY)
Admission: EM | Admit: 2012-12-11 | Discharge: 2012-12-14 | DRG: 291 | Disposition: A | Discharge status: Home / Self Care | Payer: Medicare PPO | Attending: Internal Medicine | Admitting: Internal Medicine

## 2012-12-11 ENCOUNTER — Emergency Department (HOSPITAL_COMMUNITY): Payer: Medicare PPO

## 2012-12-11 DIAGNOSIS — J189 Pneumonia, unspecified organism: Secondary | ICD-10-CM

## 2012-12-11 DIAGNOSIS — Z888 Allergy status to other drugs, medicaments and biological substances status: Secondary | ICD-10-CM

## 2012-12-11 DIAGNOSIS — Z823 Family history of stroke: Secondary | ICD-10-CM

## 2012-12-11 DIAGNOSIS — M810 Age-related osteoporosis without current pathological fracture: Secondary | ICD-10-CM | POA: Diagnosis present

## 2012-12-11 DIAGNOSIS — I5031 Acute diastolic (congestive) heart failure: Principal | ICD-10-CM | POA: Diagnosis present

## 2012-12-11 DIAGNOSIS — J4489 Other specified chronic obstructive pulmonary disease: Secondary | ICD-10-CM

## 2012-12-11 DIAGNOSIS — E785 Hyperlipidemia, unspecified: Secondary | ICD-10-CM | POA: Diagnosis present

## 2012-12-11 DIAGNOSIS — I359 Nonrheumatic aortic valve disorder, unspecified: Secondary | ICD-10-CM | POA: Diagnosis present

## 2012-12-11 DIAGNOSIS — M1990 Unspecified osteoarthritis, unspecified site: Secondary | ICD-10-CM | POA: Diagnosis present

## 2012-12-11 DIAGNOSIS — I739 Peripheral vascular disease, unspecified: Secondary | ICD-10-CM | POA: Diagnosis present

## 2012-12-11 DIAGNOSIS — I10 Essential (primary) hypertension: Secondary | ICD-10-CM | POA: Diagnosis present

## 2012-12-11 DIAGNOSIS — IMO0002 Reserved for concepts with insufficient information to code with codable children: Secondary | ICD-10-CM

## 2012-12-11 DIAGNOSIS — J449 Chronic obstructive pulmonary disease, unspecified: Secondary | ICD-10-CM | POA: Diagnosis present

## 2012-12-11 DIAGNOSIS — Z79899 Other long term (current) drug therapy: Secondary | ICD-10-CM

## 2012-12-11 DIAGNOSIS — Z8261 Family history of arthritis: Secondary | ICD-10-CM

## 2012-12-11 DIAGNOSIS — M1A9XX0 Chronic gout, unspecified, without tophus (tophi): Secondary | ICD-10-CM | POA: Diagnosis present

## 2012-12-11 DIAGNOSIS — M1A00X0 Idiopathic chronic gout, unspecified site, without tophus (tophi): Secondary | ICD-10-CM | POA: Diagnosis present

## 2012-12-11 DIAGNOSIS — M109 Gout, unspecified: Secondary | ICD-10-CM | POA: Diagnosis present

## 2012-12-11 DIAGNOSIS — F411 Generalized anxiety disorder: Secondary | ICD-10-CM | POA: Diagnosis present

## 2012-12-11 DIAGNOSIS — I509 Heart failure, unspecified: Secondary | ICD-10-CM

## 2012-12-11 DIAGNOSIS — K219 Gastro-esophageal reflux disease without esophagitis: Secondary | ICD-10-CM | POA: Diagnosis present

## 2012-12-11 DIAGNOSIS — H3530 Unspecified macular degeneration: Secondary | ICD-10-CM | POA: Diagnosis present

## 2012-12-11 DIAGNOSIS — Z8249 Family history of ischemic heart disease and other diseases of the circulatory system: Secondary | ICD-10-CM

## 2012-12-11 DIAGNOSIS — K449 Diaphragmatic hernia without obstruction or gangrene: Secondary | ICD-10-CM | POA: Diagnosis present

## 2012-12-11 DIAGNOSIS — D649 Anemia, unspecified: Secondary | ICD-10-CM | POA: Diagnosis present

## 2012-12-11 DIAGNOSIS — Z7982 Long term (current) use of aspirin: Secondary | ICD-10-CM

## 2012-12-11 LAB — CBC WITH DIFFERENTIAL/PLATELET
Basophils Absolute: 0 10*3/uL (ref 0.0–0.1)
Basophils Relative: 0 % (ref 0–1)
Eosinophils Absolute: 0 10*3/uL (ref 0.0–0.7)
Eosinophils Relative: 0 % (ref 0–5)
HCT: 41.9 % (ref 36.0–46.0)
Hemoglobin: 14.1 g/dL (ref 12.0–15.0)
Lymphocytes Relative: 2 % — ABNORMAL LOW (ref 12–46)
Lymphs Abs: 0.3 10*3/uL — ABNORMAL LOW (ref 0.7–4.0)
MCH: 31.4 pg (ref 26.0–34.0)
MCHC: 33.7 g/dL (ref 30.0–36.0)
MCV: 93.3 fL (ref 78.0–100.0)
Monocytes Absolute: 0.6 10*3/uL (ref 0.1–1.0)
Monocytes Relative: 3 % (ref 3–12)
Neutro Abs: 21.9 10*3/uL — ABNORMAL HIGH (ref 1.7–7.7)
Neutrophils Relative %: 96 % — ABNORMAL HIGH (ref 43–77)
Platelets: 206 10*3/uL (ref 150–400)
RBC: 4.49 MIL/uL (ref 3.87–5.11)
RDW: 15.4 % (ref 11.5–15.5)
WBC: 22.9 10*3/uL — ABNORMAL HIGH (ref 4.0–10.5)

## 2012-12-11 LAB — STREP PNEUMONIAE URINARY ANTIGEN: Strep Pneumo Urinary Antigen: NEGATIVE

## 2012-12-11 LAB — COMPREHENSIVE METABOLIC PANEL
Albumin: 3.2 g/dL — ABNORMAL LOW (ref 3.5–5.2)
Alkaline Phosphatase: 65 U/L (ref 39–117)
BUN: 22 mg/dL (ref 6–23)
CO2: 22 mEq/L (ref 19–32)
Calcium: 8.8 mg/dL (ref 8.4–10.5)
Chloride: 97 mEq/L (ref 96–112)
Creatinine, Ser: 0.62 mg/dL (ref 0.50–1.10)
GFR calc Af Amer: >90 mL/min (ref >90)
GFR calc non Af Amer: 80 mL/min — ABNORMAL LOW (ref >90)
Glucose, Bld: 121 mg/dL — ABNORMAL HIGH (ref 70–99)
Potassium: 3.7 mEq/L (ref 3.5–5.1)
Total Bilirubin: 0.5 mg/dL (ref 0.3–1.2)

## 2012-12-11 LAB — TROPONIN I: Troponin I: <0.3 ng/mL (ref <0.30)

## 2012-12-11 LAB — PRO B NATRIURETIC PEPTIDE: Pro B Natriuretic peptide (BNP): 11166 pg/mL — ABNORMAL HIGH (ref 0–450)

## 2012-12-11 MED ORDER — PREDNISONE 20 MG PO TABS
20.0000 mg | ORAL_TABLET | Freq: Every day | ORAL | Status: DC
  Filled 2012-12-11: qty 1

## 2012-12-11 MED ORDER — SODIUM CHLORIDE 0.9 % IV SOLN: 250.0000 mL | INTRAVENOUS | Status: DC | PRN

## 2012-12-11 MED ORDER — IPRATROPIUM BROMIDE 0.06 % NA SOLN
2.0000 | Freq: Four times a day (QID) | NASAL | Status: DC
  Filled 2012-12-11: qty 15

## 2012-12-11 MED ORDER — FERROUS GLUCONATE 324 (38 FE) MG PO TABS
325.0000 mg | ORAL_TABLET | Freq: Every day | ORAL | Status: DC
  Administered 2012-12-12: 07:00:00 324 mg via ORAL
  Administered 2012-12-13 – 2012-12-14 (×2): 325 mg via ORAL
  Filled 2012-12-11 (×4): qty 1

## 2012-12-11 MED ORDER — ADULT MULTIVITAMIN W/MINERALS CH
1.0000 | ORAL_TABLET | Freq: Every day | ORAL | Status: DC
  Administered 2012-12-12 – 2012-12-14 (×3): 1 via ORAL
  Filled 2012-12-11 (×3): qty 1

## 2012-12-11 MED ORDER — DEXTROSE 5 % IV SOLN
1.0000 g | INTRAVENOUS | Status: DC
  Administered 2012-12-11 – 2012-12-13 (×3): 1 g via INTRAVENOUS
  Filled 2012-12-11 (×4): qty 10

## 2012-12-11 MED ORDER — CALCIUM CARBONATE-VITAMIN D 500-200 MG-UNIT PO TABS
1.0000 | ORAL_TABLET | Freq: Every day | ORAL | Status: DC
  Administered 2012-12-12 – 2012-12-14 (×3): 1 via ORAL
  Filled 2012-12-11 (×4): qty 1

## 2012-12-11 MED ORDER — AMITRIPTYLINE HCL 25 MG PO TABS
25.0000 mg | ORAL_TABLET | Freq: Every day | ORAL | Status: DC
  Administered 2012-12-11 – 2012-12-13 (×3): 25 mg via ORAL
  Filled 2012-12-11 (×4): qty 1

## 2012-12-11 MED ORDER — ONDANSETRON HCL 4 MG/2ML IJ SOLN: 4.0000 mg | Freq: Four times a day (QID) | INTRAMUSCULAR | Status: DC | PRN

## 2012-12-11 MED ORDER — ALBUTEROL SULFATE (5 MG/ML) 0.5% IN NEBU: 2.5000 mg | INHALATION_SOLUTION | RESPIRATORY_TRACT | Status: DC

## 2012-12-11 MED ORDER — IPRATROPIUM BROMIDE 0.02 % IN SOLN
0.5000 mg | Freq: Four times a day (QID) | RESPIRATORY_TRACT | Status: DC
  Administered 2012-12-12 – 2012-12-14 (×8): 0.5 mg via RESPIRATORY_TRACT
  Filled 2012-12-11 (×10): qty 2.5

## 2012-12-11 MED ORDER — FUROSEMIDE 10 MG/ML IJ SOLN
80.0000 mg | Freq: Once | INTRAMUSCULAR | Status: AC
  Administered 2012-12-11: 80 mg via INTRAVENOUS
  Filled 2012-12-11: qty 8

## 2012-12-11 MED ORDER — ACETAMINOPHEN 325 MG PO TABS: 650.0000 mg | ORAL_TABLET | ORAL | Status: DC | PRN

## 2012-12-11 MED ORDER — PANTOPRAZOLE SODIUM 40 MG PO TBEC
40.0000 mg | DELAYED_RELEASE_TABLET | Freq: Every day | ORAL | Status: DC
  Administered 2012-12-12 – 2012-12-13 (×2): 40 mg via ORAL
  Filled 2012-12-11 (×3): qty 1

## 2012-12-11 MED ORDER — MOMETASONE FURO-FORMOTEROL FUM 100-5 MCG/ACT IN AERO
2.0000 | INHALATION_SPRAY | Freq: Two times a day (BID) | RESPIRATORY_TRACT | Status: DC
  Administered 2012-12-12 – 2012-12-14 (×5): 2 via RESPIRATORY_TRACT
  Filled 2012-12-11: qty 8.8

## 2012-12-11 MED ORDER — ATORVASTATIN CALCIUM 20 MG PO TABS
20.0000 mg | ORAL_TABLET | Freq: Every day | ORAL | Status: DC
  Administered 2012-12-11 – 2012-12-14 (×4): 20 mg via ORAL
  Filled 2012-12-11 (×4): qty 1

## 2012-12-11 MED ORDER — POTASSIUM CHLORIDE CRYS ER 20 MEQ PO TBCR
20.0000 meq | EXTENDED_RELEASE_TABLET | Freq: Once | ORAL | Status: AC
  Administered 2012-12-11: 18:00:00 20 meq via ORAL
  Filled 2012-12-11: qty 1

## 2012-12-11 MED ORDER — AZITHROMYCIN 500 MG PO TABS
500.0000 mg | ORAL_TABLET | ORAL | Status: DC
  Administered 2012-12-11 – 2012-12-13 (×3): 500 mg via ORAL
  Filled 2012-12-11 (×4): qty 1

## 2012-12-11 MED ORDER — ENOXAPARIN SODIUM 40 MG/0.4ML ~~LOC~~ SOLN
40.0000 mg | SUBCUTANEOUS | Status: DC
  Administered 2012-12-11 – 2012-12-13 (×3): 40 mg via SUBCUTANEOUS
  Filled 2012-12-11 (×4): qty 0.4

## 2012-12-11 MED ORDER — PREDNISONE 20 MG PO TABS
40.0000 mg | ORAL_TABLET | Freq: Every day | ORAL | Status: DC
  Administered 2012-12-12 – 2012-12-14 (×3): 40 mg via ORAL
  Filled 2012-12-11 (×4): qty 2

## 2012-12-11 MED ORDER — ESTROGENS, CONJUGATED 0.625 MG/GM VA CREA
1.0000 | TOPICAL_CREAM | Freq: Every day | VAGINAL | Status: DC
  Administered 2012-12-14: 11:00:00 1 via VAGINAL
  Filled 2012-12-11: qty 42.5

## 2012-12-11 MED ORDER — METOPROLOL SUCCINATE ER 25 MG PO TB24
25.0000 mg | ORAL_TABLET | Freq: Every day | ORAL | Status: DC
  Administered 2012-12-11 – 2012-12-14 (×4): 25 mg via ORAL
  Filled 2012-12-11 (×5): qty 1

## 2012-12-11 MED ORDER — VITAMIN D3 25 MCG (1000 UNIT) PO TABS
1000.0000 [IU] | ORAL_TABLET | Freq: Every day | ORAL | Status: DC
  Administered 2012-12-12 – 2012-12-14 (×3): 1000 [IU] via ORAL
  Filled 2012-12-11 (×3): qty 1

## 2012-12-11 MED ORDER — VITAMIN D 1000 UNITS PO CAPS: 1000.0000 [IU] | ORAL_CAPSULE | Freq: Every day | ORAL | Status: DC

## 2012-12-11 MED ORDER — CALCITONIN (SALMON) 200 UNIT/ACT NA SOLN
1.0000 | Freq: Every day | NASAL | Status: DC
  Administered 2012-12-12 – 2012-12-14 (×3): 1 via NASAL
  Filled 2012-12-11: qty 3.7

## 2012-12-11 MED ORDER — ASPIRIN 81 MG PO CHEW
81.0000 mg | CHEWABLE_TABLET | Freq: Every day | ORAL | Status: DC
Start: 2012-12-12 — End: 2012-12-14
  Administered 2012-12-12 – 2012-12-14 (×3): 81 mg via ORAL
  Filled 2012-12-11 (×3): qty 1

## 2012-12-11 MED ORDER — TRAMADOL HCL 50 MG PO TABS: 50.0000 mg | ORAL_TABLET | Freq: Four times a day (QID) | ORAL | Status: DC | PRN

## 2012-12-11 MED ORDER — IPRATROPIUM-ALBUTEROL 0.5-2.5 (3) MG/3ML IN SOLN: 3.0000 mL | Freq: Four times a day (QID) | RESPIRATORY_TRACT | Status: DC

## 2012-12-11 MED ORDER — ALBUTEROL SULFATE (5 MG/ML) 0.5% IN NEBU
2.5000 mg | INHALATION_SOLUTION | Freq: Four times a day (QID) | RESPIRATORY_TRACT | Status: DC
  Administered 2012-12-12 – 2012-12-14 (×8): 2.5 mg via RESPIRATORY_TRACT
  Filled 2012-12-11 (×10): qty 0.5

## 2012-12-11 MED ORDER — SODIUM CHLORIDE 0.9 % IJ SOLN: 3.0000 mL | INTRAMUSCULAR | Status: DC | PRN

## 2012-12-11 MED ORDER — FUROSEMIDE 10 MG/ML IJ SOLN
40.0000 mg | Freq: Two times a day (BID) | INTRAMUSCULAR | Status: DC
  Administered 2012-12-11 – 2012-12-14 (×6): 40 mg via INTRAVENOUS
  Filled 2012-12-11 (×8): qty 4

## 2012-12-11 MED ORDER — SODIUM CHLORIDE 0.9 % IJ SOLN
3.0000 mL | Freq: Two times a day (BID) | INTRAMUSCULAR | Status: DC
  Administered 2012-12-11 – 2012-12-14 (×6): 3 mL via INTRAVENOUS

## 2012-12-11 MED ORDER — METOCLOPRAMIDE HCL 10 MG PO TABS
10.0000 mg | ORAL_TABLET | Freq: Every day | ORAL | Status: DC
  Administered 2012-12-11 – 2012-12-13 (×3): 10 mg via ORAL
  Filled 2012-12-11 (×4): qty 1

## 2012-12-11 MED ORDER — CALCIUM CARBONATE-VITAMIN D 600-400 MG-UNIT PO TABS: 1.0000 | ORAL_TABLET | Freq: Every day | ORAL | Status: DC

## 2012-12-11 MED ORDER — FEBUXOSTAT 40 MG PO TABS
40.0000 mg | ORAL_TABLET | Freq: Every day | ORAL | Status: DC
  Administered 2012-12-12 – 2012-12-14 (×3): 40 mg via ORAL
  Filled 2012-12-11 (×3): qty 1

## 2012-12-11 MED ORDER — MULTIVITAMINS PO CAPS: 1.0000 | ORAL_CAPSULE | Freq: Every day | ORAL | Status: DC

## 2012-12-11 MED ORDER — LOSARTAN POTASSIUM 25 MG PO TABS
25.0000 mg | ORAL_TABLET | Freq: Every day | ORAL | Status: DC
  Administered 2012-12-12 – 2012-12-14 (×3): 25 mg via ORAL
  Filled 2012-12-11 (×3): qty 1

## 2012-12-11 NOTE — ED Notes (Signed)
While giving report, RN on 4700 concerned about RR, wants rapid response RN to come look at her before the pt is admitted.

## 2012-12-11 NOTE — ED Notes (Signed)
The tech cleaned and dressed skin tear to patients right upper extremity. The tech reported to the RN in charge.

## 2012-12-11 NOTE — Progress Notes (Signed)
Subjective:    Patient ID: Andrea Rubio, female    DOB: 1925/07/27, 79 y.o.   MRN: [id number]  HPI 79 y/o WF here for a follow up visit... she has multiple medical problems as noted below...   ~  May 27, 2011:  15mo ROV & Andrea Rubio fell in her bathroom several weeks ago- hit the back of her head & left arm abrasion; EMS checked her & dressed her wound and she decided to see her local physician DrWElkins; he redressed the left arm abrasion & reassured her;  We rechecked her occipital area- sm scab ok to wash etc, and left arm abrasion healing nicely; She knows to be more careful to avoid falls etc... We reviewed her meds, problem list, & prev labs> see below>>    She tells me she saw [facility] for Cards f/u about a month ago> stable, no changes made (we don't get notes from him)...  ~  October 07, 2011:  15mo ROV     She saw Urology 7/13 for Estring change> this has been controlling her dysuria & is done every 70mo...    She is followed by Susann Givens for Ortho> L1 compression fx ~4/13; on Tramadol & Miacalcin NS, Vit D, & Calcium... We reviewed prob list, meds, xrays and labs> see below>> She requests refill of Nizoral shampoo- ok... LABS 9/13:  FLP- not at goals on Simva40, reminded to take everyday;  Chems- wnl;  CBC- wnl  ~  February 10, 2012:  15mo ROV & Andrea Rubio has had a refractory AB episode w/ persist cough & discolored mucus; now on Avelox & we reviewed the need to maximize her Rx w/ Pred 5mg /d, NEBS Tid, Advair250Bid, Mucinex-2Bid, Fluids, etc... BP controlled on Lisin + Lasix;  Cards followed by [facility]UJWJXBJ[facility];  She was started on Atorva20 but didn't bring her med bottles to review (as requested)...    We reviewed prob list, meds, xrays and labs> see below for updates >>   ~  March 14, 2012:  24mo ROV & Andrea Rubio has improved but not all the way resolved from her recent AB exac (see below); We reviewed the following medical problems during today's office visit >>     AB> she had AB exac last month &  Rx w/ Avelox, Pred, Nebs, Advair250, Mucinex, etc; she is improved but not back to baseline & we are forced to bump Pred to 20mg  tabs- 4d taper + NEBS Qid, max Mucinex, etc...    HBP> now on Cozaar25, Lasix40, K20; BP=122/82 & she denies CP, palpit, ch in SOB, edema, etc...    Cardiac> severeAS & CHF> followed by NWGNFAOZH, meds reviewed, we don't have notes from him...    Hyperlipid> states she is off the Lip20; she did not bring med list or bottles to review; asked to have Cards send notes to me & f/u FLP off meds...    HH, GERD, Divertics> on Prilosec20 & Metaclop10qhs; sister died w/ colon ca- pts last colonoscopy 6/06 by [facility] w/o polyps (+divertics & ischemic colitis)...    DJD, Gout, LBP> on Uloric40 & Tramadol50 prn; she has severe deformities & difficulty getting up & about; she desperately needs more PT but she refuses...    Osteoporosis> on Miacalcin NS;     Anxiety> she takes [id number] but declines anxiolytic rx... We reviewed prob list, meds, xrays and labs> see below for updates >>  LABS 2/14:  Chems- wnl;  CBC- wnl;  BNP= 427...   ~  April 20, 2012:  75mo ROV & Andrea Rubio has stabilized by bumping up Pred, now weaned back down to 5mg /d plus her max regimen... She is managing as best she can w/ help of her son at home; notes some pain in her left hip- known osteoporosis & BMD 3/14 showed TScores +0.7 in Spine, and -2.3 in left Fem Neck, and -1.9 in Forearm radius; she's been on the Miacalcin NS "it seems to be helping" she says, therefore continue the same for now...  BP stable, denies CP, palpit, ch in SOB etc... She has severe AS followed by [facility] but apparently there is not much he can do...    We reviewed prob list, meds, xrays and labs> see below for updates >> we will recheck her in 2-3 months...  ~  May 24, 2012:  75mo ROV & in the interval Andrea Rubio had yet another exac characterized by incr cough, yellow sput, congestion, wheezing & SOB; we called in Levaquin, then had to bump the  Pred back up (first to 10mg /d, then to 20mg /d);  She reports that she is a lot better now on the Pred20- "I'm better than I've been the whole time"- and we discussed a SLOW wean to try to determine her threshold dose> for now decr to 20 alt w/ 10mg  Qod & f/u 75mo...  She will also continue on her Miacalcin NS, Calcium, MVI, VitD 1000u/d...    We reviewed prob list, meds, xrays and labs> see below for updates >>   ~  Jun 22, 2012:  75mo ROV & Andrea Rubio has been on Pred20mg - taking one tab alt w/ 1/2 tab Qod for the last month; pt states "I'm a lot better, just can't get over the bronchitis from last fall";  She has lost 3# & edema is diminished- BP= 124/80, Chest exam w/ exp rhonchi & she is reminded to take the Mucinex regularly;  I gave her & son the option of continuing the Pred 20-10 vs wean further to 10mg /d & they prefer the latter (told it was ok to take an extra 10mg  Pred on any day she noted incr trouble breathing)...     We reviewed prob list, meds, xrays and labs> see below for updates >>   ~  July 27, 2012:  75mo ROV & Andrea Rubio reports that she is "doing pretty good", has cut down her Pred to 10mg /d & rarely needs the extra 10mg  on any given day; she does not like the hot weather; her son wonders if Singulair would help her & we agreed to write a new Rx for this as a trial;  We also rec adding Flutter valve therapy to aide in mucocilliary production...  BP is stable at 118/68 and O2 sat= 96% on room air...  ~  September 21, 2012:  35mo ROV & Andrea Rubio has had an exac w/ incr cough, change in phlegm to thick dark sput, and sl more SOB but no f/c/s, no CP, etc;  She tried singulair (per son's request) but no better therefore rec to stop this add-on med;  Asked to use Flutter valve and incr efforts at improving mucociliary function & sputum clearance but she never did & doesn't think she can do it effectively (?really);  Exam shows stable VS, sats 96% on RA, chest w/ bibasilar rhonchi;  CXR shows chr changes w/o acute  dis & Labs showed elev WBC=20K w/ elev sed=43;  We decided to treat w/ Levaquin empirically, bump Pred 10=>20/d, and continue vigorous efforts at mucus clearance.Marland KitchenMarland Kitchen  AB, COPD> on Pred, Nebs, Advair250, Mucinex, etc; see above>     HBP> now on Cozaar25, Lasix40, K20; BP=118/68 & she denies CP, palpit, ch in SOB, edema, etc...    Cardiac> severeAS & CHF> on ASA + above; followed by WUJWJXBJY- we don't have notes from him...    Hyperlipid> Lip20- still on her list; she did not bring med list or bottles to review; asked to have Cards send notes to me & f/u FLP when able...    HH, GERD, Divertics> on Prilosec20 & Metaclop10qhs; sister died w/ colon ca- pts last colonoscopy 6/06 by [facility] w/o polyps (+divertics & ischemic colitis)...    DJD, Gout, LBP> on Uloric40 & Tramadol50 prn; she has severe deformities (bilat shoulder arthroplasties) & difficulty getting up & about; she desperately needs more PT but she refuses...    Osteoporosis> on Miacalcin NS, calcium, MVI, VitD; BMD 3/14 showed TScore -2.3 in left [facility]; continue same...    Anxiety> she takes [id number] but declines anxiolytic rx... We reviewed prob list, meds, xrays and labs> see below for updates >>  LABS 8/14:  Chems- ok x BS=134;  CBC- ok x WBC=20K;  BNP=484;  Sed=43;  Sput= NTF only... CXR 8/14 showed cardiomeg, atherosclerosis of Ao, chr changes but clear lungs/ NAD, bilat shoulder arthroplasties, compressionL1...  ~  October 24, 2012:  53mo ROV & Andrea Rubio indicates that she improved on the Levaquin & w/ incr in Pred to 20mg /d after last OV but notes that her bronchitis symptoms returned w/ cough & brown sputum by her hx;  We decided to treat w/ Keflex 500mg Tid & try to decr the Pred to 20mg  alt w/ 10mg  Qod... Follow up 53mo recheck.    We reviewed prob list, meds, xrays and labs> see below for updates >>   ~  December 07, 2012:  53mo ROV & Andrea Rubio's son reports that he tried to decr the Pred to 20-10 Qod but she had incr difficulty  breathing & he bumped her up to 20mg /d again; she finished the Keflex, & is using the Nebulizer, Mucinex, & Chest PT/ postural drain as best she can; she notes increased congestion at nights & we discussed the roll of nocturnal reflux in this symptom- rec to elev HOB 6", incr Omep20 to Bid & continue the Reglan10mg Qhs;  She also notes some sharp CP under left breast- worse w/ cough & we reviewed Rx w/ rest, heat, cough syrup prn; Note> there are no other substantial interventions avail for treatment & we may have to discuss hospice care before too long... Once again asked to try to decr the Pred to 20-10Qod if able... Follow up visit in 6 wks.          Problem List:    MACULAR DEGENERATION (ICD-362.50) - she is legally blind & son helps out at home...  SINUSITIS, CHRONIC (ICD-473.9) - she is s/p bliat [facility] 1986 by DrKraus... ~  CT Sinus 11/09 showed chr sinusitis changes in the max & sphenoid regions... ~  8/12: she is c/o drippy nose, trial Atrovent nasal spray...  ASTHMATIC BRONCHITIS, ACUTE (ICD-466.0) - on PRED 5mg /d, ADVAIR 250Bid, & ALBUTEROL NEBS Tid... stable- min cough, w/o sputum, no change in DOE, etc... ~  PFT's 3/06 showed FVC= 1.49 (68%), FEV1= 1.12 (75%), %1sec=75, mid-flows= 43%pred... ~  [facility] 8/09 w/ bilat pneumonia... see DC Summary. ~  CXR 10/09 showed stable cardiomeg, tort Ao, clear lungs, bilat shoulder arthroplasties... ~  CT Angio Chest 11/09 showed neg for PE,  fluid/ mucus plugs in depend LL bronchi... ~  CXR 10/10 showed stable cardiomeg, bilat humeral head prostheses, NAD.Marland Kitchen. ~  CXR 1/12 showed COPD, chr changes, ?right basilar opacity ==> subsequent films w/ incr edema. ~  CXR 5/12 showed cardiomeg, sm right effusion, no edema, bilat shoulder arthroplasties, NAD... ~  10/12:  AB exac treated by TP w/ Augmentin & improved... ~  1/14:  AB exac treated w/ Avelox, Pred taper, NEBS qid, Advair250, Mucinex, etc... ~  CXR 1/14 showed borderline heart size, sm  bilat effusions and basilar atx...  ~  3/14: back on Pred 5mg /d and stable once again=> then had yet another exac requiring Rx w/ Levaquin, incr Pred to 20mg /d... ~  4/14:  Much improved on Pred20/d & we decided to decr to 20-10 Qod & recheck 64mo... ~  5/14:  She appears stable & wants to attempt further Pred taper to 10mg /d w/ ROV 73mo=> stable continue the same... ~  8/14:  COPD exac treated w/ Levaquin, bump Pred to 20, continue max Rx=> CXR w/ chr changes, Sput=> NTF; pt reports improved on rx... ~  9/14:  She reports initially better then w/ recurrent cough, dark sputum, no CP/ ch in SOB, f/c/s; we decided to treat again w/ Keflex 500mg  Tid & try to wean the Pred 20-10 Qod.  HYPERTENSION (ICD-401.9) >>  ~  8/12: her BP's have stabilized at home and measures 112/68 here today... she denies CP, palpit, syncope, ch in edema... ~  12/12:on Lisin5/d, Lasix40/d & K20/d; BP= 126/72 & clinically stable w/o CP, palpit, ch in SOB, etc... ~  4/13: on Lisin5/d, Lasix40/d & K20/d; BP= 108/62 & usually sl better at home; still denies any CP, palpit, ch in SOB, etc... ~  2/14: now on Cozaar25, Lasix40, K20; BP=122/82 & she denies CP, palpit, ch in SOB, edema, etc ~  5/14: on Cozaar25, Lasix40, K20; BP=124/80 & she denies CP, palpit, ch in SOB, edema, etc... ~  8/14: on Cozaar25, Lasix40, K20; BP=118/68 & she denies CP, palpit, ch in SOB, edema, etc... ~  9/14: on same meds- BP=110/72  AORTIC STENOSIS (ICD-424.1)   << followed by WUJWJXBJY for Cards >> Hx of CHF (ICD-428.0)  ~  2DEcho 3/06 showed mild AS/AI w/ mod incr AoV thickness & reduced leaflet excursion, mild MR, norm EF=55-65%...  ~  2DEcho 12/08 showed mod inc AoV thickness, mod reduced AV leaflet excursion, c/w mod AS, mild AI & mean grad= ;  norm LVF w/ EF= 65% and no wall motion abn etc... ~  2DEcho 1/12 in [place] showed severeAS & mildAI, mild conc LVH, norm sys func w/ EF=60-65%, HK at apex, grI DD... ~  EKG 1/12 showed NSR, poor R  progression, NSSTTWA... ~  We do not have follow up notes from [facility] pt indicates no change in meds...  PERIPHERAL VASCULAR DISEASE (ICD-443.9) - on ASA 81mg /d... she can't walk enough to elicit discomfort, no rest pain or lesions... ~  ABI's 12/01 showed .88 on R, and .95 on L... ~  MRI showed small vessel dis & small infarct noted...  VENOUS INSUFFICIENCY (ICD-459.81) - on low sodium diet, Lasix, KCl...  HYPERLIPIDEMIA (ICD-272.4) - prev on Zocor 40mg  per NWGNFAOZH- off now;  (prev on Pravachol, but stated intol to other Statins)... takes Fish Oil ~2000mg /d rec by her ophthalmologist... ~  FLP 9/05 ?on Prav40 showed TChol 176, TG 190, HDL 55, LDL 83... ~  FLP 4/10 on diet alone showed TChol 211, TG 153, HDL 44, LDL 128.Marland KitchenMarland Kitchen  she refuses med Rx- continue diet efforts. ~  1/12:  Hosp by [facility] & consulted DrKadakia who started her on ZOCOR40mg /d> he is checking her labs now... ~  12/12: she crossed Simva40 off her list ?stopped on her own or ?stopped by [place]... ~  2013:  She still has Simva40 on her list here but NEVER brings a list from [place] or her med bottles to review... ~  9/13:  FLP here showed TChol 206, TG 167, HDL 48, LDL 133... She has agreed to try ATORVASTATIN 20mg /d... ~  2/14: she reports that she is off all statin meds, on diet alone... ~  8/14: med list includes Lip20- she did not bring meds to office, we have never seen notes from [place]...  HIATAL HERNIA (ICD-553.3) - on PRILOSEC 20mg /d & METACLOPRAMIDE 10mg Qhs... GERD (ICD-530.81) - EGD 3/06 by DrStark showed 3cmHH, mild esophagitis...  DIVERTICULOSIS, COLON (ICD-562.10) - last colonoscopy was 6/06 by [facility] showed divertics and ischemic colitis...  ~  Adm 1/12 by [facility] w/ Diverticulitis, treated & improved, she has not followed up w/ GI; she take 1/2 capful of MIRALAX daily.  STRESS INCONTINENCE (ICD-788.39) - she has hx UTI's & dysuria on & off... ~  10/10: urine c/s +EColi & Rx'd w/ Cipro. ~  7/12: she reports vaginal  estrogen per Urology has helped her urinary symptoms; note from DrOttelin reviewed. ~  Labs 12/12 w/ normal renal function & Creat=0.7 ~  10/13: she had f/u DrOttelin> Hx trophic vaginitis, dysuria, & recurrent UTIs; rec topical estrogen=> estring (he replaced it that day); PVR=100; he rec ROV in 20mo for estring replacement. ~  2/14 & 5/14: f/u post menopausal atrophic vaginitis, chr cystitis, on Estring but cannot replace it herself- seen Q70mo for this purpose... ~  8/14: she saw Urology for belated estring change, hx atrophic vaginitis, chr cystitis & dysuria...   DEGENERATIVE JOINT DISEASE (ICD-715.90) - she has severe DJD & Gout w/ deformities- Foot XRay showed Charcot foot deformity, severe degenerative dis, etc...  added TRAMADOL + TYLENOL Prn... GOUT, UNSPECIFIED (ICD-274.9) - labs 4/10 showed Uric Acid level= 7.9.Marland KitchenMarland Kitchen she has hx of gout & hyperuricemia but allergic to Allopurinol w/ rash... therefore on ULORIC 40mg /d... Uric Acid level 5/11 on Uloric40 = 3.2 LOW BACK PAIN (ICD-724.2) NECK PAIN (ICD-723.1) - eval by Susann Givens ==> DrLewitt 2010 w/ MRI & Rx  w/ Lidoderm, Tylenol, Lyrica- now on NEURONTIN... OSTEOPOROSIS (ICD-733.00) - prev on Fosamax (stopped 1/12 in hosp "my kidneys can't handle it"); on Ca++, Vits, & Vit D... ~  labs 4/10 showed Vit D level = 28... rec> start Vit D OTC 1000 u daily. ~  labs 5/11 showed Vit D level = 32... rec incr Vit D to 2000 u daily. ~  2013:  She fell w/ L1 compression & managed by Susann Givens w/ MIACALCIN NS & Tramadol... ~  3/14:  BMD here showed TScores +0.7 in Spine, and -2.3 in left Fem Neck, and -1.9 in Forearm radius; she feels the Miacalcin is helping, therefore continue same...  ANXIETY (ICD-300.00) - off prev Alpraz Rx & takes AMITRIPTYLINE 25mg  Qhs... under stress w/ 74 y/o son had MI & stent in 2010.  Hx of ANEMIA (ICD-285.9) - hx anemia req 2U transfusion 6/06 hosp for ischemic colitis...  ~  Hg= 12 - 13 over 2008-9. ~  labs 8/09 hosp showed Hg=  14 ~  labs 4/10 showed Hg= 12.2 ~  labs 3/11 showed Hg= 12.5, MCV= 92, Fe= 22 (9%sat)... start FeSO4 Bid. ~  labs 5/11 showed = 13.0, MCV= 94, Fe= 57 (23%)... continue Fe supplement. ~  Labs 12/12 showed Hg= 13.1, Fe= 35, (14%sat)... rec to continue Fe+VitC Bid... ~  Labs 9/13 showed Hg= 13.7, MCV=94 ~  Labs 8/14 showed Hg= 14.3, MCV= 91  DERM>  She has skin cancer removed from the right side of her face by DrDanJones (Spring2012)...   Past Surgical History  Procedure Laterality Date  . Lumbar laminectomy    . Shoulder surgery      bilateral    Outpatient Encounter Prescriptions as of 12/07/2012  Medication Sig  . aspirin 81 MG tablet Take 81 mg by mouth daily.    . Azelastine-Fluticasone 137-50 MCG/ACT SUSP Place 1-2 sprays into the nose 2 (two) times daily as needed.  . calcitonin, salmon, (MIACALCIN/FORTICAL) 200 UNIT/ACT nasal spray Place 1 spray into the nose daily.  . Calcium Carbonate-Vitamin D (CALCIUM 600+D) 600-400 MG-UNIT per tablet Take 1 tablet by mouth daily.   . Cholecalciferol (VITAMIN D) 1000 UNITS capsule Take 1,000 Units by mouth daily.    Marland Kitchen conjugated estrogens (PREMARIN) vaginal cream daily.   . ferrous gluconate (FERGON) 325 MG tablet Take 325 mg by mouth daily with breakfast.   . Fluticasone-Salmeterol (ADVAIR DISKUS) 250-50 MCG/DOSE AEPB Inhale 1 puff into the lungs 2 (two) times daily.  . Guaifenesin 1200 MG TB12 Take 1 tablet by mouth 2 (two) times daily.  Marland Kitchen ipratropium-albuterol (DUONEB) 0.5-2.5 (3) MG/3ML SOLN Take 3 mLs by nebulization 4 (four) times daily as needed.  Marland Kitchen ketoconazole (NIZORAL) 2 % shampoo Use as directed  . Multiple Vitamin (MULTIVITAMIN) capsule Take 1 capsule by mouth daily.    . predniSONE (DELTASONE) 20 MG tablet Take prednisone as directed  . Respiratory Therapy Supplies (FLUTTER) DEVI Use as directed  . [DISCONTINUED] amitriptyline (ELAVIL) 25 MG tablet TAKE ONE TABLET AT BEDTIME.  . [DISCONTINUED] atorvastatin (LIPITOR) 20 MG  tablet TAKE 1 TABLET ONCE DAILY.  . [DISCONTINUED] clotrimazole-betamethasone (LOTRISONE) cream APPLY TO RASH TWICE DAILY.  . [DISCONTINUED] furosemide (LASIX) 40 MG tablet TAKE 1 TABLET ONCE DAILY.  . [DISCONTINUED] ipratropium (ATROVENT) 0.06 % nasal spray USE 2 SPRAYS EACH NOSTRIL 4 TIMES A DAY.  . [DISCONTINUED] losartan (COZAAR) 25 MG tablet TAKE 1 TABLET DAILY.  . [DISCONTINUED] metoCLOPramide (REGLAN) 10 MG tablet TAKE ONE TABLET AT BEDTIME.  . [DISCONTINUED] omeprazole (PRILOSEC) 20 MG capsule TAKE 1 TABLET ONCE DAILY.  . [DISCONTINUED] potassium chloride SA (K-DUR,KLOR-CON) 20 MEQ tablet TAKE 1 TABLET ONCE DAILY.  . [DISCONTINUED] predniSONE (DELTASONE) 20 MG tablet Alternate 10mg  every other day with 20 mg  . [DISCONTINUED] ULORIC 40 MG tablet TAKE 1 TABLET EACH DAY.  . [DISCONTINUED] ULTRAM 50 MG tablet TAKE ONE TABLET EVERY 6 HOURS AS NEEDED FOR PAIN (MAY TAKE WITH TYLENOL).  Marland Kitchen albuterol (PROVENTIL) (2.5 MG/3ML) 0.083% nebulizer solution Take 3 mLs (2.5 mg total) by nebulization 4 (four) times daily.  . [DISCONTINUED] cephALEXin (KEFLEX) 500 MG capsule One tablet TID x 10 days until gone    Allergies  Allergen Reactions  . Allopurinol     REACTION: rash  . Codeine   . Hydrocodone     REACTION: hallucinations  . Morphine     REACTION: hallucinations    Current Medications, Allergies, Past Medical History, Past Surgical History, Family History, and Social History were reviewed in [employer] record.    Review of Systems        See HPI - all other systems neg except as noted... The  patient complains of dyspnea on exertion, peripheral edema, abdominal pain, indigestion/heartburn, muscle weakness, and difficulty walking.  The patient denies anorexia, fever, weight loss, weight gain, decreased hearing, hoarseness, chest pain, syncope,  headaches, hemoptysis, melena, hematochezia, hematuria, incontinence, suspicious skin lesions, depression, unusual weight  change, abnormal bleeding, enlarged lymph nodes, and angioedema.     Objective:   Physical Exam    WD, Petite, sl overweight, 79 y/o WF in NAD... she is <5' tall, weight 130# GENERAL:  Alert & oriented; pleasant & cooperative... HEENT:  [place]/AT, EACs-clear, TMs-wnl, NOSE-clear, THROAT-clear & wnl... known macular degen per ophthal... NECK:  Supple w/ decrROM; no JVD; normal carotid impulses w/o bruits; no thyromegaly or nodules palpated; no lymphadenopathy. CHEST:  Decr BS bilat, Coarse BS, no wheezing or rhonchi... HEART:  Regular Rhythm;  gr 2/6 SEM without rubs or gallops detected... ABDOMEN:  Soft & nontender; normal bowel sounds; no organomegaly or masses palpated... EXT: +hand deformities, w/ tophi & mod arthritic changes & contractures... right knee arthritis & severe foot deformities as well. no varicose veins/+venous insuffic/ tr edema. NEURO:  CN's intact x reduced vision... diffusely weak, without focal changes... +gait abn... DERM:  Abrasion over occiput & on left arm; +ecchymoses, thin skin, etc...  RADIOLOGY DATA:  Reviewed in the EPIC EMR & discussed w/ the patient...  LABORATORY DATA:  Reviewed in the EPIC EMR & discussed w/ the patient...   Assessment & Plan:    AB>  She has an AB exac & refractory symptoms- she finished the prev KEFLEX, try to wean PRED to 20-10 qod, & continue other meds; We discussed the need to maximize her home resp treatments...  HBP & Diastolic CHF>  Controlled on meds- Losatan & Lasix; continue same; she will f/u w/ [id number] as planned.  AS>  Followed by [id number] for her severe AS, not a surg candidate...  HYPERLIPID>  Off Simva40 now ?per Cards or on her own; she will check w/ DrKadakia==> we don't have notes, apparently off all meds, she agrees to try ATORVASTATIN20=> stopped on her own...  GI>  Hx HH, GERD, Divertics, Colitis>  On Prilosec, Reglan 10mg  Qhs (she does not want to stop this med), & Miralax as noted...  GU>  Dysuria  improved w/ vaginal cream (no estring) & it is changed Q18mo...  DJD/ Gout/ Neck&Back Pain>  As noted- on Tramadol, Tylenol, Uloric; she is weak in the legs, hard to stand & walk, I'm afraid she will become immobile, unable to stand or walk making self care difficult but she is unperturbed by this & refuses my rec for more home PT...  Osteoporosis>  BMD w/ TScore -2.3 in [facility]; she is on Miacalcin NS & feels this is helping- continue same...  Anxiety>  She has Amitriptyline 25Qhs, not on anxiolytic.Marland KitchenMarland Kitchen

## 2012-12-11 NOTE — Consult Note (Signed)
PHARMACY NOTE  Consult:  Azithromycin, Ceftriaxone Indication:  CAP, renal adjustment of antibiotics.  Pt Weight:  58 kg CrCl :  40 ml/min  97.9 F (36.6 C) (Oral) ,  Lab Results  Component Value Date   WBC 22.9* 12/11/2012   Assessment:    79 year old female with history of chronic bronchitis, hypertension, history of CHF, aortic stenosis, macular degeneration, peripheral vascular disease, hyperlipidemia, GERD, hiatal hernia, chronic gout, osteoporosis, and anemia admitted for treatment of CAP.  Patient placed on Azithromycin and Ceftriaxone.  No Renal adjustments are required for these antibiotics.  Goal of Therapy:   Renal adjustment of antibiotics, if required.  Plan:  1. Continue Azithromycin and Ceftriaxone as ordered.. 2. Pharmacy will sign off at this time. 3. Please call Pharmacy with any questions or concerns.  Thank you.   Johari Pinney, Elisha Headland,  Pharm.D.   12/11/2012, 5:09 PM

## 2012-12-11 NOTE — ED Notes (Signed)
Per EMS - pt c/o increased shortness of breath X 3 days. At home normally does 4 nebulizer treatments, today gave herself 3 then call EMS. EMS administered 1 and pt took steroid PTA. 98% room air. BP 177/111 HR 110.

## 2012-12-11 NOTE — Telephone Encounter (Signed)
Pt woke up this am with increased sob.  Husband gave neb rx and prednisone 40mg  with significant improvement.  He states she is doing ok right now.  He denies worsening edema over baseline in LE.  No purulence or fever.  I asked him to use nebs today as needed, and to give her 40mg  prednisone in the am.  They are to call office in am to get appt with NP or Dr. Kriste Basque.  They are to call today if breathing worsens.

## 2012-12-11 NOTE — ED Notes (Signed)
Pt c/o extreme pain from the BP cuff, family requesting to take it off. Pt and family told it is important for [place] to monitor her vital signs while she is down here. Changed BP to go off less often.

## 2012-12-11 NOTE — ED Provider Notes (Signed)
CSN: [id number]     Arrival date & time 12/11/12  1133 History   First MD Initiated Contact with Patient 12/11/12 1134     Chief Complaint  Patient presents with  . Shortness of Breath   (Consider location/radiation/quality/duration/timing/severity/associated sxs/prior Treatment) HPI Comments: Patient with history of congestive heart failure and COPD presents with progressively worsening shortness of breath. Symptoms have been progressing over a course of 3 days. She reports that she has administered 3 nebulizer treatments this morning and did not improve. She called EMS and was given an additional nebulizer transport. She has had minimal improvement. Patient denies chest pain associated with the symptoms. She does notice increased swelling of the legs.  Patient is a 79 y.o. female presenting with shortness of breath.  Shortness of Breath Associated symptoms: no chest pain     Past Medical History  Diagnosis Date  . Macular degeneration   . Chronic sinusitis   . Acute asthmatic bronchitis   . Pneumonia   . HTN (hypertension)   . Aortic stenosis   . CHF (congestive heart failure)   . PVD (peripheral vascular disease)   . Venous insufficiency   . Hyperlipidemia   . Hiatal hernia   . GERD (gastroesophageal reflux disease)   . Colon, diverticulosis   . Stress incontinence, female   . DJD (degenerative joint disease)   . Gout   . Low back pain   . Neck pain   . Osteoporosis   . Anxiety   . Anemia    Past Surgical History  Procedure Laterality Date  . Lumbar laminectomy    . Shoulder surgery      bilateral   Family History  Problem Relation Age of Onset  . Heart disease Brother   . Rheum arthritis Mother   . Stroke Father   . Hypertension Mother   . Hypertension Father   . Hypertension Brother   . Hypertension Brother   . Hypertension Brother   . Hypertension Brother   . Hypertension Sister   . Hypertension Sister    History  Substance Use Topics  . Smoking  status: Never Smoker   . Smokeless tobacco: Never Used  . Alcohol Use: No   OB History   Grav Para Term Preterm Abortions TAB SAB Ect Mult Living                 Review of Systems  Respiratory: Positive for shortness of breath.   Cardiovascular: Positive for leg swelling. Negative for chest pain.  All other systems reviewed and are negative.    Allergies  Allopurinol; Codeine; Hydrocodone; and Morphine  Home Medications   Current Outpatient Rx  Name  Route  Sig  Dispense  Refill  . albuterol (PROVENTIL) (2.5 MG/3ML) 0.083% nebulizer solution   Nebulization   Take 3 mLs (2.5 mg total) by nebulization 4 (four) times daily.   360 mL   0   . amitriptyline (ELAVIL) 25 MG tablet   Oral   Take 25 mg by mouth at bedtime.         Marland Kitchen aspirin 81 MG tablet   Oral   Take 81 mg by mouth daily.           Marland Kitchen atorvastatin (LIPITOR) 20 MG tablet   Oral   Take 20 mg by mouth daily.         . Azelastine-Fluticasone 137-50 MCG/ACT SUSP   Nasal   Place 1-2 sprays into the nose 2 (two) times daily as  needed.   1 Bottle   5   . calcitonin, salmon, (MIACALCIN/FORTICAL) 200 UNIT/ACT nasal spray   Nasal   Place 1 spray into the nose daily.         . Calcium Carbonate-Vitamin D (CALCIUM 600+D) 600-400 MG-UNIT per tablet   Oral   Take 1 tablet by mouth daily.          . Cholecalciferol (VITAMIN D) 1000 UNITS capsule   Oral   Take 1,000 Units by mouth daily.           . clotrimazole-betamethasone (LOTRISONE) cream   Topical   Apply 1 application topically 2 (two) times daily.         Marland Kitchen conjugated estrogens (PREMARIN) vaginal cream      daily.          . febuxostat (ULORIC) 40 MG tablet   Oral   Take 40 mg by mouth daily.         . ferrous gluconate (FERGON) 325 MG tablet   Oral   Take 325 mg by mouth daily with breakfast.          . Fluticasone-Salmeterol (ADVAIR DISKUS) 250-50 MCG/DOSE AEPB   Inhalation   Inhale 1 puff into the lungs 2 (two) times  daily.   60 each   6   . furosemide (LASIX) 40 MG tablet   Oral   Take 40 mg by mouth daily.         . Guaifenesin 1200 MG TB12   Oral   Take 1 tablet by mouth 2 (two) times daily.         Marland Kitchen ipratropium (ATROVENT) 0.06 % nasal spray   Each Nare   Place 2 sprays into both nostrils 4 (four) times daily.         Marland Kitchen ipratropium-albuterol (DUONEB) 0.5-2.5 (3) MG/3ML SOLN   Nebulization   Take 3 mLs by nebulization 4 (four) times daily as needed.   360 mL   6     PATIENT IS REQUESTING AN INCREASE TO 4 TIMES DAILY .Marland Kitchen.   . ketoconazole (NIZORAL) 2 % shampoo      Use as directed   120 mL   PRN   . losartan (COZAAR) 25 MG tablet   Oral   Take 25 mg by mouth daily.         . metoCLOPramide (REGLAN) 10 MG tablet   Oral   Take 10 mg by mouth at bedtime.         . Multiple Vitamin (MULTIVITAMIN) capsule   Oral   Take 1 capsule by mouth daily.           Marland Kitchen omeprazole (PRILOSEC) 20 MG capsule   Oral   Take 20 mg by mouth daily.         . potassium chloride SA (K-DUR,KLOR-CON) 20 MEQ tablet   Oral   Take 20 mEq by mouth once.         . predniSONE (DELTASONE) 20 MG tablet      Take prednisone as directed   50 tablet   6   . predniSONE (DELTASONE) 20 MG tablet   Oral   Take 10-20 mg by mouth daily with breakfast. Alternates between 10mg  and 20 mg every other day 20-10-20-10         . Respiratory Therapy Supplies (FLUTTER) DEVI      Use as directed   1 each   0   . traMADol (ULTRAM) 50 MG tablet  Oral   Take 50 mg by mouth every 6 (six) hours as needed.          BP 104/60  Pulse 85  Temp(Src) 98.4 F (36.9 C) (Oral)  Resp 25  Ht 4\' 8"  (1.422 m)  Wt 129 lb (58.514 kg)  BMI 28.94 kg/m2  SpO2 95% Physical Exam  Constitutional: She is oriented to person, place, and time. She appears well-developed and well-nourished. No distress.  HENT:  Head: Normocephalic and atraumatic.  Right Ear: Hearing normal.  Left Ear: Hearing normal.  Nose:  Nose normal.  Mouth/Throat: Oropharynx is clear and moist and mucous membranes are normal.  Eyes: Conjunctivae and EOM are normal. Pupils are equal, round, and reactive to light.  Neck: Normal range of motion. Neck supple.  Cardiovascular: Regular rhythm, S1 normal and S2 normal.  Exam reveals no gallop and no friction rub.   No murmur heard. Pulmonary/Chest: Effort normal. No respiratory distress. She has decreased breath sounds. She has wheezes. She has rhonchi. She has rales. She exhibits no tenderness.  Abdominal: Soft. Normal appearance and bowel sounds are normal. There is no hepatosplenomegaly. There is no tenderness. There is no rebound, no guarding, no tenderness at McBurney's point and negative Murphy's sign. No hernia.  Musculoskeletal: Normal range of motion. She exhibits edema.  Neurological: She is alert and oriented to person, place, and time. She has normal strength. No cranial nerve deficit or sensory deficit. Coordination normal. GCS eye subscore is 4. GCS verbal subscore is 5. GCS motor subscore is 6.  Skin: Skin is warm, dry and intact. No rash noted. No cyanosis.  Psychiatric: She has a normal mood and affect. Her speech is normal and behavior is normal. Thought content normal.    ED Course  Procedures (including critical care time) Labs Review Labs Reviewed  CBC WITH DIFFERENTIAL - Abnormal; Notable for the following:    WBC 22.9 (*)    Neutrophils Relative % 96 (*)    Neutro Abs 21.9 (*)    Lymphocytes Relative 2 (*)    Lymphs Abs 0.3 (*)    All other components within normal limits  COMPREHENSIVE METABOLIC PANEL - Abnormal; Notable for the following:    Glucose, Bld 121 (*)    Albumin 3.2 (*)    AST 83 (*)    ALT 71 (*)    GFR calc non Af Amer 80 (*)    All other components within normal limits  PRO B NATRIURETIC PEPTIDE - Abnormal; Notable for the following:    Pro B Natriuretic peptide (BNP) 11166.0 (*)    All other components within normal limits   TROPONIN I   Imaging Review Dg Chest Port 1 View  12/11/2012   CLINICAL DATA:  Shortness of breath.  EXAM: PORTABLE CHEST - 1 VIEW  COMPARISON:  09/21/2012 and 02/29/2012  FINDINGS: Lungs are adequately inflated with mild worsening airspace density over the left mid to lower lung likely a left-sided effusion with associated atelectasis versus infection. Stable small chronic right pleural fluid collection. Stable chronic perihilar prominence. Stable cardiomegaly. Remainder of the exam is unchanged.  IMPRESSION: Worsening opacification over the left mid lower lung likely a left-sided effusion with atelectasis versus infection. Stable chronic small right pleural fluid collection. Stable bilateral prominence of the perihilar markings.  Mild stable cardiomegaly.   Electronically Signed   By: Elberta Fortis M.D.   On: 12/11/2012 12:01    EKG Interpretation     Ventricular Rate:  102 PR Interval:  137 QRS Duration: 88 QT Interval:  345 QTC Calculation: 449 R Axis:   -40 Text Interpretation:  Sinus tachycardia Multiple premature complexes, vent  Probable left atrial enlargement Left ventricular hypertrophy Anterior infarct, old            MDM  Diagnosis: 1. Congestive heart failure 2. COPD  Patient presents to the ER for progressively worsening shortness of breath. Patient has a history of congestive heart failure and COPD. She has had multiple nebulizer treatments without improvement. Examination reveals wheezing, rales and rhonchi. Workup reveals marked elevation of her BNP over baseline. X-ray shows a left-sided effusion. Cannot rule out infection, does have leukocytosis but this appears to be chronic for her. She is on steroids. Patient will require hospitalization for further management of acute congestive heart failure exacerbation with COPD.   Gilda Crease, MD 12/11/12 [phone]

## 2012-12-11 NOTE — ED Notes (Signed)
Admitting physician at bedside

## 2012-12-11 NOTE — Progress Notes (Signed)
Reviewed Heart Failure Booklet to patient and her daughter, there are no questions at this time; will continue to monitor patient.  Lorretta Harp RN

## 2012-12-11 NOTE — ED Notes (Signed)
Pt c/o pain all over body, mainly to right upper arm where the BP cuff is, reports she had her BP taken at her PCP office and got a bruise from that. Pt has multiple bruises on arm and legs in different sizes and stages of healing. Pt reports she bruises very easily. Pt appears to be diaphoretic, tachypnea and labored breathing. Denies CP. Reports increased sob X 3 days. sts she has gone to see her pulmonologist this week.

## 2012-12-11 NOTE — ED Notes (Signed)
Portable xray at bedside.

## 2012-12-11 NOTE — H&P (Signed)
[facility] History and Physical  Andrea Rubio:[id number] DOB: 01-Feb-1926 DOA: 12/11/2012  Referring physician: Dr Blinda Leatherwood PCP: Michele Mcalpine, MD   Chief Complaint:  Progressive SOB for past 4 days  HPI:  79 year old female with history of chronic bronchitis, hypertension, history of CHF, aortic stenosis, macular degeneration, peripheral vascular disease, hyperlipidemia, GERD, hiatal hernia, chronic gout, osteoporosis, anemia who was brought into the ED by her family for progressive shortness of breath for past 4 days. Patient reports that she has been feeling tired with generalized weakness for over one week with mild dyspnea on exertion. Patient reports having shortness of breath at rest with occasional orthopnea. Denies PND. She also reports productive cough with brownish phlegm. Shortness of breath did not improve with 3 nebulizer treatment at home this morning. At baseline she ambulates in and around the house with a walker.Denies any fever or chills at home. She has noticed increase in her leg swelling is for possible days. She denies any headache, blurred vision, nausea, vomiting, chest pain, palpitations, bowel or urinary symptoms. Denies any sick contacts or recent travel.  Course in the ED This was found to be wheezy with scattered rhonchi on exam. Vitals were otherwise stable. Blood work showed WBC of 23,000 with predominant neutrophils. BNP was markedly elevated to 11,000. A portable chest x-ray done in the ED showed worsening opacification over the left mid lower lung possible for atelectasis versus infiltrate. Patient given 80 mg IV Lasix in the ED . [facility] called to admit patient with concern for CHF exacerbation with possible underlying pneumonia.   Review of Systems:  Constitutional: Denies fever, chills, diaphoresis, appetite change and fatigue.  HEENT: Denies photophobia, eye pain, redness, hearing loss, ear pain, congestion, sore throat, rhinorrhea,  sneezing, mouth sores, trouble swallowing, neck pain, neck stiffness and tinnitus.   Respiratory:SOB, DOE, productive cough, and wheezing.   Cardiovascular: Denies chest pain, palpitations, has leg swelling.  Gastrointestinal: Denies nausea, vomiting, abdominal pain, diarrhea, constipation, blood in stool and abdominal distention.  Genitourinary: Denies dysuria, urgency, frequency, hematuria, flank pain and difficulty urinating.  Endocrine: Denies  polyuria, polydipsia. Musculoskeletal: Denies myalgias,  joint swelling, arthralgias and gait problem.  has chronic back pain. Uses walker for ambulation Skin: Denies pallor, rash and wound.  Neurological: Denies dizziness, seizures, syncope, weakness, light-headedness, numbness and headaches.  Hematological: Reports easy bruising    Past Medical History  Diagnosis Date  . Macular degeneration   . Chronic sinusitis   . Acute asthmatic bronchitis   . Pneumonia   . HTN (hypertension)   . Aortic stenosis   . CHF (congestive heart failure)   . PVD (peripheral vascular disease)   . Venous insufficiency   . Hyperlipidemia   . Hiatal hernia   . GERD (gastroesophageal reflux disease)   . Colon, diverticulosis   . Stress incontinence, female   . DJD (degenerative joint disease)   . Gout   . Low back pain   . Neck pain   . Osteoporosis   . Anxiety   . Anemia    Past Surgical History  Procedure Laterality Date  . Lumbar laminectomy    . Shoulder surgery      bilateral   Social History:  reports that she has never smoked. She has never used smokeless tobacco. She reports that she does not drink alcohol or use illicit drugs.  Allergies  Allergen Reactions  . Allopurinol     REACTION: rash  . Codeine   . Hydrocodone  REACTION: hallucinations  . Morphine     REACTION: hallucinations    Family History  Problem Relation Age of Onset  . Heart disease Brother   . Rheum arthritis Mother   . Stroke Father   . Hypertension Mother    . Hypertension Father   . Hypertension Brother   . Hypertension Brother   . Hypertension Brother   . Hypertension Brother   . Hypertension Sister   . Hypertension Sister     Prior to Admission medications   Medication Sig Start Date End Date Taking? Authorizing Provider  albuterol (PROVENTIL) (2.5 MG/3ML) 0.083% nebulizer solution Take 3 mLs (2.5 mg total) by nebulization 4 (four) times daily. 11/29/12  Yes Michele Mcalpine, MD  amitriptyline (ELAVIL) 25 MG tablet Take 25 mg by mouth at bedtime.   Yes Historical Provider, MD  aspirin 81 MG tablet Take 81 mg by mouth daily.     Yes Historical Provider, MD  atorvastatin (LIPITOR) 20 MG tablet Take 20 mg by mouth daily.   Yes Historical Provider, MD  Azelastine-Fluticasone 137-50 MCG/ACT SUSP Place 1-2 sprays into the nose 2 (two) times daily as needed. 02/16/12  Yes Michele Mcalpine, MD  calcitonin, salmon, (MIACALCIN/FORTICAL) 200 UNIT/ACT nasal spray Place 1 spray into the nose daily.   Yes Historical Provider, MD  Calcium Carbonate-Vitamin D (CALCIUM 600+D) 600-400 MG-UNIT per tablet Take 1 tablet by mouth daily.    Yes Historical Provider, MD  Cholecalciferol (VITAMIN D) 1000 UNITS capsule Take 1,000 Units by mouth daily.     Yes Historical Provider, MD  clotrimazole-betamethasone (LOTRISONE) cream Apply 1 application topically 2 (two) times daily.   Yes Historical Provider, MD  conjugated estrogens (PREMARIN) vaginal cream daily.  09/02/10  Yes Historical Provider, MD  febuxostat (ULORIC) 40 MG tablet Take 40 mg by mouth daily.   Yes Historical Provider, MD  ferrous gluconate (FERGON) 325 MG tablet Take 325 mg by mouth daily with breakfast.    Yes Historical Provider, MD  Fluticasone-Salmeterol (ADVAIR DISKUS) 250-50 MCG/DOSE AEPB Inhale 1 puff into the lungs 2 (two) times daily. 11/29/12  Yes Michele Mcalpine, MD  furosemide (LASIX) 40 MG tablet Take 40 mg by mouth daily.   Yes Historical Provider, MD  Guaifenesin 1200 MG TB12 Take 1 tablet by  mouth 2 (two) times daily.   Yes Historical Provider, MD  ipratropium (ATROVENT) 0.06 % nasal spray Place 2 sprays into both nostrils 4 (four) times daily.   Yes Historical Provider, MD  ipratropium-albuterol (DUONEB) 0.5-2.5 (3) MG/3ML SOLN Take 3 mLs by nebulization 4 (four) times daily as needed. 11/18/12  Yes Michele Mcalpine, MD  ketoconazole (NIZORAL) 2 % shampoo Use as directed 10/07/11  Yes Michele Mcalpine, MD  losartan (COZAAR) 25 MG tablet Take 25 mg by mouth daily.   Yes Historical Provider, MD  metoCLOPramide (REGLAN) 10 MG tablet Take 10 mg by mouth at bedtime.   Yes Historical Provider, MD  Multiple Vitamin (MULTIVITAMIN) capsule Take 1 capsule by mouth daily.     Yes Historical Provider, MD  omeprazole (PRILOSEC) 20 MG capsule Take 20 mg by mouth daily.   Yes Historical Provider, MD  potassium chloride SA (K-DUR,KLOR-CON) 20 MEQ tablet Take 20 mEq by mouth once.   Yes Historical Provider, MD  predniSONE (DELTASONE) 20 MG tablet Take prednisone as directed 12/07/12  Yes Michele Mcalpine, MD  predniSONE (DELTASONE) 20 MG tablet Take 10-20 mg by mouth daily with breakfast. Alternates between 10mg  and 20 mg  every other day 20-10-20-10   Yes Historical Provider, MD  Respiratory Therapy Supplies (FLUTTER) DEVI Use as directed 07/27/12  Yes Michele Mcalpine, MD  traMADol (ULTRAM) 50 MG tablet Take 50 mg by mouth every 6 (six) hours as needed.   Yes Historical Provider, MD    Physical Exam:  Filed Vitals:   12/11/12 1502 12/11/12 1530 12/11/12 1545 12/11/12 1559  BP: 104/60   153/66  Pulse:  86 82 93  Temp:      TempSrc:      Resp:  35 23 24  Height:      Weight:      SpO2:  96% 96% 96%    Constitutional: Vital signs reviewed.  Elderly female lying in bed in no acute distress HEENT: No pallor, moist oral mucosa, no icterus, no JVD Cardiovascular: RRR, S1 normal, S2 normal, no MRG,  Pulmonary/Chest: Coarse breath sounds at lung bases bilaterally, scattered rhonchi Abdominal: Soft. Non-tender,  non-distended, bowel sounds are normal, no masses, organomegaly, or guarding present.  Musculoskeletal: No joint deformities, erythema, or stiffness, ROM full and no nontender Ext: Warm, trace edema, chronic skin changes  Neurological: A&O x3, nonfocal   Labs on Admission:  Basic Metabolic Panel:  Recent Labs Lab 12/11/12 1208  NA 137  K 3.7  CL 97  CO2 22  GLUCOSE 121*  BUN 22  CREATININE 0.62  CALCIUM 8.8   Liver Function Tests:  Recent Labs Lab 12/11/12 1208  AST 83*  ALT 71*  ALKPHOS 65  BILITOT 0.5  PROT 6.8  ALBUMIN 3.2*   No results found for this basename: LIPASE, AMYLASE,  in the last 168 hours No results found for this basename: AMMONIA,  in the last 168 hours CBC:  Recent Labs Lab 12/11/12 1208  WBC 22.9*  NEUTROABS 21.9*  HGB 14.1  HCT 41.9  MCV 93.3  PLT 206   Cardiac Enzymes:  Recent Labs Lab 12/11/12 1208  TROPONINI <0.30   BNP: No components found with this basename: POCBNP,  CBG: No results found for this basename: GLUCAP,  in the last 168 hours  Radiological Exams on Admission: Dg Chest Port 1 View  12/11/2012   CLINICAL DATA:  Shortness of breath.  EXAM: PORTABLE CHEST - 1 VIEW  COMPARISON:  09/21/2012 and 02/29/2012  FINDINGS: Lungs are adequately inflated with mild worsening airspace density over the left mid to lower lung likely a left-sided effusion with associated atelectasis versus infection. Stable small chronic right pleural fluid collection. Stable chronic perihilar prominence. Stable cardiomegaly. Remainder of the exam is unchanged.  IMPRESSION: Worsening opacification over the left mid lower lung likely a left-sided effusion with atelectasis versus infection. Stable chronic small right pleural fluid collection. Stable bilateral prominence of the perihilar markings.  Mild stable cardiomegaly.   Electronically Signed   By: Elberta Fortis M.D.   On: 12/11/2012 12:01    Rubio:WRUEA tachy @102 , no ST-T-  changes  Assessment/Plan Principal Problem:   Acute CHF (congestive heart failure) Admit to telemetry. Strict I/O. Increase her on IV Lasix 40 mg twice a day. monitor daily weight. Will order 2-D echo. Continue losartan. Will add low dose metoprolol.  Active Problems: Community-acquired pneumonia Patient reports increased productive cough, leukocytosis and chest x-ray suggestive of left-sided infiltrates. We'll treat empirically with Rocephin and azithromycin. Follow cultures, urine for strep antigen and Legionella antigen. Follow sputum culture.  Chronic bronchitis I will increase her prednisone dose to 40 mg daily. Continue scheduled nebs. Continue dulera.  Lipidemia Continue  statin   Peripheral vascular disease Stable. Continue aspirin  GERD Continue PPI   DVT prophylaxis: Subcutaneous Lovenox  Code Status: Full code Family Communication: Son and daughter is at bedside Disposition Plan: Admit to telemetry.  Eddie North [facility] Pager [phone]  If 7PM-7AM, please contact night-coverage [email or web address] Password [email or web address] 12/11/2012, 4:32 PM   Total time spent on admission: 70 minutes

## 2012-12-11 NOTE — Progress Notes (Signed)
Patient has arrived to unit, vital signs are stable and patient has been assessed, will continue to monitor patient. Lorretta Harp RN

## 2012-12-11 NOTE — Progress Notes (Signed)
Report received from ED nurse, patient's RR is 35; rapid response has been called to go and assess patient before patient arrives to unit to make sure patient is stable for unit. Lorretta Harp RN

## 2012-12-11 NOTE — Progress Notes (Signed)
Spoke with rapid response nurse and patient is ok with patient coming to unit; will assess patient when she arrives to unit from ED. Lorretta Harp RN

## 2012-12-12 LAB — BASIC METABOLIC PANEL
Chloride: 100 mEq/L (ref 96–112)
Creatinine, Ser: 0.68 mg/dL (ref 0.50–1.10)
GFR calc Af Amer: 90 mL/min — ABNORMAL LOW (ref >90)
GFR calc non Af Amer: 78 mL/min — ABNORMAL LOW (ref >90)
Potassium: 2.9 mEq/L — ABNORMAL LOW (ref 3.5–5.1)
Sodium: 141 mEq/L (ref 135–145)

## 2012-12-12 LAB — LEGIONELLA ANTIGEN, URINE

## 2012-12-12 MED ORDER — POTASSIUM CHLORIDE CRYS ER 20 MEQ PO TBCR
40.0000 meq | EXTENDED_RELEASE_TABLET | Freq: Once | ORAL | Status: AC
  Administered 2012-12-12: 40 meq via ORAL
  Filled 2012-12-12: qty 2

## 2012-12-12 NOTE — Progress Notes (Signed)
[facility] PROGRESS NOTE      Andrea Rubio YQM:[id number] DOB: February 18, 1925 DOA: 12/11/2012 PCP: Michele Mcalpine, MD  Assessment/Plan: Acute CHF (congestive heart failure)  - 2D echo pending.  Strict I/O. Increase her on IV Lasix 40 mg twice a day. monitor daily weight.  Continue losartan. Will add low dose metoprolol.  Community-acquired pneumonia  - with bronchitis on and off with flare-ups for the past year.  Patient reports increased productive cough, leukocytosis and chest x-ray suggestive of left-sided infiltrates. We'll treat empirically with Rocephin and azithromycin. Follow cultures, urine for strep antigen and Legionella antigen. Follow sputum culture.  Chronic bronchitis  I will increase her prednisone dose to 40 mg daily. Continue scheduled nebs. Continue dulera.  Lipidemia  Continue statin  Peripheral vascular disease  Stable. Continue aspirin  GERD  Continue PPI  Diet: Heart Fluids: none DVT Prophylaxis: Lovenox  Code Status: Full; readdressed today.  Family Communication: daughter bedside  Disposition Plan: inpatient, home when ready  Consultants:  none  Procedures:  2D echo pending   Antibiotics  Anti-infectives   Start     Dose/Rate Route Frequency Ordered Stop   12/11/12 1700  cefTRIAXone (ROCEPHIN) 1 g in dextrose 5 % 50 mL IVPB     1 g 100 mL/hr over 30 Minutes Intravenous Every 24 hours 12/11/12 1647 12/18/12 1659   12/11/12 1700  azithromycin (ZITHROMAX) tablet 500 mg     500 mg Oral Every 24 hours 12/11/12 1647 12/18/12 1659     Antibiotics Given (last 72 hours)   Date/Time Action Medication Dose Rate   12/11/12 1753 Given   cefTRIAXone (ROCEPHIN) 1 g in dextrose 5 % 50 mL IVPB 1 g 100 mL/hr   12/11/12 1754 Given   azithromycin (ZITHROMAX) tablet 500 mg 500 mg      HPI/Subjective: - feeling well  Objective: Filed Vitals:   12/12/12 0449 12/12/12 0739 12/12/12 0952 12/12/12 1414  BP: 122/61  114/55 115/57  Pulse: 83  87  89  Temp: 98.2 F (36.8 C)  98.5 F (36.9 C) 97.7 F (36.5 C)  TempSrc: Oral  Oral Oral  Resp: 20  22 20   Height:      Weight: 57 kg (125 lb 10.6 oz)     SpO2: 98% 98% 98% 97%    Intake/Output Summary (Last 24 hours) at 12/12/12 1435 Last data filed at 12/12/12 1100  Gross per 24 hour  Intake   1440 ml  Output   2651 ml  Net  -1211 ml   Filed Weights   12/11/12 1135 12/11/12 1641 12/12/12 0449  Weight: 58.514 kg (129 lb) 58.1 kg (128 lb 1.4 oz) 57 kg (125 lb 10.6 oz)    Exam:   General:  NAD  Cardiovascular: regular rate and rhythm, without MRG  Respiratory: good air movement, clear to auscultation throughout, no wheezing, ronchi or rales  Abdomen: soft, not tender to palpation, positive bowel sounds  MSK: no peripheral edema  Neuro: CN 2-12 grossly intact, MS 5/5 in all 4  Data Reviewed: Basic Metabolic Panel:  Recent Labs Lab 12/11/12 1208 12/12/12 0405  NA 137 141  K 3.7 2.9*  CL 97 100  CO2 22 27  GLUCOSE 121* 78  BUN 22 23  CREATININE 0.62 0.68  CALCIUM 8.8 8.3*   Liver Function Tests:  Recent Labs Lab 12/11/12 1208  AST 83*  ALT 71*  ALKPHOS 65  BILITOT 0.5  PROT 6.8  ALBUMIN 3.2*   No results found for  this basename: LIPASE, AMYLASE,  in the last 168 hours No results found for this basename: AMMONIA,  in the last 168 hours CBC:  Recent Labs Lab 12/11/12 1208  WBC 22.9*  NEUTROABS 21.9*  HGB 14.1  HCT 41.9  MCV 93.3  PLT 206   Cardiac Enzymes:  Recent Labs Lab 12/11/12 1208  TROPONINI <0.30   BNP (last 3 results)  Recent Labs  03/14/12 1551 09/21/12 1452 12/11/12 1208  PROBNP 427.0* 484.0* 11166.0*   CBG: No results found for this basename: GLUCAP,  in the last 168 hours  Recent Results (from the past 240 hour(s))  CULTURE, BLOOD (ROUTINE X 2)     Status: None   Collection Time    12/11/12  5:20 PM      Result Value Range Status   Specimen Description BLOOD LEFT HAND   Final   Special Requests BOTTLES  DRAWN AEROBIC ONLY 5CC   Final   Culture  Setup Time     Final   Value: 12/11/2012 23:42     Performed at [facility]   Culture     Final   Value:        BLOOD CULTURE RECEIVED NO GROWTH TO DATE CULTURE WILL BE HELD FOR 5 DAYS BEFORE ISSUING A FINAL NEGATIVE REPORT     Performed at [facility]   Report Status PENDING   Incomplete  CULTURE, BLOOD (ROUTINE X 2)     Status: None   Collection Time    12/11/12  5:25 PM      Result Value Range Status   Specimen Description BLOOD RIGHT HAND   Final   Special Requests BOTTLES DRAWN AEROBIC ONLY 0.5CC   Final   Culture  Setup Time     Final   Value: 12/11/2012 23:42     Performed at [facility]   Culture     Final   Value:        BLOOD CULTURE RECEIVED NO GROWTH TO DATE CULTURE WILL BE HELD FOR 5 DAYS BEFORE ISSUING A FINAL NEGATIVE REPORT     Performed at [facility]   Report Status PENDING   Incomplete     Studies: Dg Chest Port 1 View  12/11/2012   CLINICAL DATA:  Shortness of breath.  EXAM: PORTABLE CHEST - 1 VIEW  COMPARISON:  09/21/2012 and 02/29/2012  FINDINGS: Lungs are adequately inflated with mild worsening airspace density over the left mid to lower lung likely a left-sided effusion with associated atelectasis versus infection. Stable small chronic right pleural fluid collection. Stable chronic perihilar prominence. Stable cardiomegaly. Remainder of the exam is unchanged.  IMPRESSION: Worsening opacification over the left mid lower lung likely a left-sided effusion with atelectasis versus infection. Stable chronic small right pleural fluid collection. Stable bilateral prominence of the perihilar markings.  Mild stable cardiomegaly.   Electronically Signed   By: Elberta Fortis M.D.   On: 12/11/2012 12:01    Scheduled Meds: . albuterol  2.5 mg Nebulization Q6H  . amitriptyline  25 mg Oral QHS  . aspirin  81 mg Oral Daily  . atorvastatin  20 mg Oral Daily  . azithromycin  500 mg Oral Q24H  .  calcitonin (salmon)  1 spray Alternating Nares Daily  . calcium-vitamin D  1 tablet Oral Q breakfast  . cefTRIAXone (ROCEPHIN)  IV  1 g Intravenous Q24H  . cholecalciferol  1,000 Units Oral Daily  . conjugated estrogens  1 Applicatorful Vaginal Daily  .  enoxaparin (LOVENOX) injection  40 mg Subcutaneous Q24H  . febuxostat  40 mg Oral Daily  . ferrous gluconate  325 mg Oral Q breakfast  . furosemide  40 mg Intravenous Q12H  . ipratropium  2 spray Each Nare QID  . ipratropium  0.5 mg Nebulization Q6H  . losartan  25 mg Oral Daily  . metoCLOPramide  10 mg Oral QHS  . metoprolol succinate  25 mg Oral Daily  . mometasone-formoterol  2 puff Inhalation BID  . multivitamin with minerals  1 tablet Oral Daily  . pantoprazole  40 mg Oral Daily  . predniSONE  40 mg Oral Q breakfast  . sodium chloride  3 mL Intravenous Q12H   Continuous Infusions:   Principal Problem:   Acute CHF (congestive heart failure) Active Problems:   HYPERLIPIDEMIA   Gout, unspecified   ANEMIA   ANXIETY   HYPERTENSION   CHF   PERIPHERAL VASCULAR DISEASE   DEGENERATIVE JOINT DISEASE   Asthmatic bronchitis , chronic   CAP (community acquired pneumonia)   Time spent: 42  Pamella Pert, MD [facility] Pager [phone]. If 7 PM - 7 AM, please contact night-coverage at [email or web address], password [facility] 12/12/2012, 2:35 PM  LOS: 1 day

## 2012-12-13 LAB — BASIC METABOLIC PANEL
Chloride: 98 mEq/L (ref 96–112)
GFR calc Af Amer: >90 mL/min (ref >90)
GFR calc non Af Amer: 80 mL/min — ABNORMAL LOW (ref >90)
Potassium: 3.3 mEq/L — ABNORMAL LOW (ref 3.5–5.1)

## 2012-12-13 MED ORDER — POTASSIUM CHLORIDE CRYS ER 20 MEQ PO TBCR
30.0000 meq | EXTENDED_RELEASE_TABLET | Freq: Once | ORAL | Status: AC
  Administered 2012-12-13: 30 meq via ORAL
  Filled 2012-12-13: qty 1

## 2012-12-13 NOTE — Progress Notes (Signed)
  Echocardiogram 2D Echocardiogram has been performed.  Arvil Chaco 12/13/2012, 9:59 AM

## 2012-12-13 NOTE — Progress Notes (Signed)
Utilization Review Completed Jahaira Earnhart J. Yvett Rossel, RN, BSN, NCM [phone]  

## 2012-12-13 NOTE — Progress Notes (Signed)
[facility] PROGRESS NOTE      Andrea Rubio ZOX:[id number] DOB: 03-27-1925 DOA: 12/11/2012 PCP: Michele Mcalpine, MD  Assessment/Plan: Acute CHF (congestive heart failure)  - 2D echo done, read pending.  Strict I/O.  IV Lasix 40 mg twice a day. Weights better.   Continue losartan. Will add low dose metoprolol.  Community-acquired pneumonia  - with bronchitis on and off with flare-ups for the past year.  Patient reports increased productive cough, leukocytosis and chest x-ray suggestive of left-sided infiltrates.  - narrowed at [facility] today Chronic bronchitis  I will increase her prednisone dose to 40 mg daily. Continue scheduled nebs. Continue dulera.  Lipidemia  Continue statin  Peripheral vascular disease  Stable. Continue aspirin  GERD  Continue PPI  Diet: Heart Fluids: none DVT Prophylaxis: Lovenox  Code Status: Full; readdressed today.  Family Communication: daughter bedside  Disposition Plan: inpatient, home when ready, Wed  Consultants:  none  Procedures:  2D echo pending   Antibiotics  Anti-infectives   Start     Dose/Rate Route Frequency Ordered Stop   12/11/12 1700  cefTRIAXone (ROCEPHIN) 1 g in dextrose 5 % 50 mL IVPB     1 g 100 mL/hr over 30 Minutes Intravenous Every 24 hours 12/11/12 1647 12/18/12 1659   12/11/12 1700  azithromycin (ZITHROMAX) tablet 500 mg     500 mg Oral Every 24 hours 12/11/12 1647 12/18/12 1659     Antibiotics Given (last 72 hours)   Date/Time Action Medication Dose Rate   12/11/12 1753 Given   cefTRIAXone (ROCEPHIN) 1 g in dextrose 5 % 50 mL IVPB 1 g 100 mL/hr   12/11/12 1754 Given   azithromycin (ZITHROMAX) tablet 500 mg 500 mg    12/12/12 1626 Given   azithromycin (ZITHROMAX) tablet 500 mg 500 mg    12/12/12 1627 Given   cefTRIAXone (ROCEPHIN) 1 g in dextrose 5 % 50 mL IVPB 1 g 100 mL/hr     HPI/Subjective: - better this morning  Objective: Filed Vitals:   12/12/12 2015 12/13/12 0611 12/13/12 0953  12/13/12 1019  BP:  114/55  120/62  Pulse:  84  93  Temp:  97.8 F (36.6 C)  98 F (36.7 C)  TempSrc:  Oral  Oral  Resp:  18  20  Height:      Weight:  54.3 kg (119 lb 11.4 oz)    SpO2: 95% 96% 95% 100%    Intake/Output Summary (Last 24 hours) at 12/13/12 1035 Last data filed at 12/13/12 0816  Gross per 24 hour  Intake    883 ml  Output   1101 ml  Net   -218 ml   Filed Weights   12/11/12 1641 12/12/12 0449 12/13/12 0611  Weight: 58.1 kg (128 lb 1.4 oz) 57 kg (125 lb 10.6 oz) 54.3 kg (119 lb 11.4 oz)    Exam:   General:  NAD  Cardiovascular: regular rate and rhythm, without MRG  Respiratory: good air movement, clear to auscultation throughout, no wheezing, ronchi or rales  Abdomen: soft, not tender to palpation, positive bowel sounds  MSK: no peripheral edema  Neuro: CN 2-12 grossly intact, MS 5/5 in all 4  Data Reviewed: Basic Metabolic Panel:  Recent Labs Lab 12/11/12 1208 12/12/12 0405 12/13/12 0707  NA 137 141 140  K 3.7 2.9* 3.3*  CL 97 100 98  CO2 22 27 26   GLUCOSE 121* 78 103*  BUN 22 23 16   CREATININE 0.62 0.68 0.62  CALCIUM 8.8 8.3*  8.5   Liver Function Tests:  Recent Labs Lab 12/11/12 1208  AST 83*  ALT 71*  ALKPHOS 65  BILITOT 0.5  PROT 6.8  ALBUMIN 3.2*   CBC:  Recent Labs Lab 12/11/12 1208  WBC 22.9*  NEUTROABS 21.9*  HGB 14.1  HCT 41.9  MCV 93.3  PLT 206   Cardiac Enzymes:  Recent Labs Lab 12/11/12 1208  TROPONINI <0.30   BNP (last 3 results)  Recent Labs  03/14/12 1551 09/21/12 1452 12/11/12 1208  PROBNP 427.0* 484.0* 11166.0*   Recent Results (from the past 240 hour(s))  CULTURE, BLOOD (ROUTINE X 2)     Status: None   Collection Time    12/11/12  5:20 PM      Result Value Range Status   Specimen Description BLOOD LEFT HAND   Final   Special Requests BOTTLES DRAWN AEROBIC ONLY 5CC   Final   Culture  Setup Time     Final   Value: 12/11/2012 23:42     Performed at [facility]   Culture      Final   Value:        BLOOD CULTURE RECEIVED NO GROWTH TO DATE CULTURE WILL BE HELD FOR 5 DAYS BEFORE ISSUING A FINAL NEGATIVE REPORT     Performed at [facility]   Report Status PENDING   Incomplete  CULTURE, BLOOD (ROUTINE X 2)     Status: None   Collection Time    12/11/12  5:25 PM      Result Value Range Status   Specimen Description BLOOD RIGHT HAND   Final   Special Requests BOTTLES DRAWN AEROBIC ONLY 0.5CC   Final   Culture  Setup Time     Final   Value: 12/11/2012 23:42     Performed at [facility]   Culture     Final   Value:        BLOOD CULTURE RECEIVED NO GROWTH TO DATE CULTURE WILL BE HELD FOR 5 DAYS BEFORE ISSUING A FINAL NEGATIVE REPORT     Performed at [facility]   Report Status PENDING   Incomplete     Studies: Dg Chest Port 1 View  12/11/2012   CLINICAL DATA:  Shortness of breath.  EXAM: PORTABLE CHEST - 1 VIEW  COMPARISON:  09/21/2012 and 02/29/2012  FINDINGS: Lungs are adequately inflated with mild worsening airspace density over the left mid to lower lung likely a left-sided effusion with associated atelectasis versus infection. Stable small chronic right pleural fluid collection. Stable chronic perihilar prominence. Stable cardiomegaly. Remainder of the exam is unchanged.  IMPRESSION: Worsening opacification over the left mid lower lung likely a left-sided effusion with atelectasis versus infection. Stable chronic small right pleural fluid collection. Stable bilateral prominence of the perihilar markings.  Mild stable cardiomegaly.   Electronically Signed   By: Elberta Fortis M.D.   On: 12/11/2012 12:01    Scheduled Meds: . albuterol  2.5 mg Nebulization Q6H  . amitriptyline  25 mg Oral QHS  . aspirin  81 mg Oral Daily  . atorvastatin  20 mg Oral Daily  . azithromycin  500 mg Oral Q24H  . calcitonin (salmon)  1 spray Alternating Nares Daily  . calcium-vitamin D  1 tablet Oral Q breakfast  . cefTRIAXone (ROCEPHIN)  IV  1 g Intravenous  Q24H  . cholecalciferol  1,000 Units Oral Daily  . conjugated estrogens  1 Applicatorful Vaginal Daily  . enoxaparin (LOVENOX) injection  40  mg Subcutaneous Q24H  . febuxostat  40 mg Oral Daily  . ferrous gluconate  325 mg Oral Q breakfast  . furosemide  40 mg Intravenous Q12H  . ipratropium  2 spray Each Nare QID  . ipratropium  0.5 mg Nebulization Q6H  . losartan  25 mg Oral Daily  . metoCLOPramide  10 mg Oral QHS  . metoprolol succinate  25 mg Oral Daily  . mometasone-formoterol  2 puff Inhalation BID  . multivitamin with minerals  1 tablet Oral Daily  . pantoprazole  40 mg Oral Daily  . potassium chloride  30 mEq Oral Once  . predniSONE  40 mg Oral Q breakfast  . sodium chloride  3 mL Intravenous Q12H   Continuous Infusions:   Principal Problem:   Acute CHF (congestive heart failure) Active Problems:   HYPERLIPIDEMIA   Gout, unspecified   ANEMIA   ANXIETY   HYPERTENSION   CHF   PERIPHERAL VASCULAR DISEASE   DEGENERATIVE JOINT DISEASE   Asthmatic bronchitis , chronic   CAP (community acquired pneumonia)   Time spent: 25  Pamella Pert, MD [facility] Pager [phone]. If 7 PM - 7 AM, please contact night-coverage at [email or web address], password [facility] 12/13/2012, 10:35 AM  LOS: 2 days

## 2012-12-13 NOTE — Evaluation (Signed)
Physical Therapy Evaluation Patient Details Name: Andrea Rubio MRN: [id number] DOB: September 30, 1925 Today's Date: 12/13/2012 Time: 4098-1191 PT Time Calculation (min): 25 min  PT Assessment / Plan / Recommendation History of Present Illness  79 year old female with history of chronic bronchitis, hypertension, history of CHF, aortic stenosis, macular degeneration, peripheral vascular disease, hyperlipidemia, GERD, hiatal hernia, chronic gout, osteoporosis, anemia who was brought into the ED by her family for progressive shortness of breath for past 4 days. Patient reports that she has been feeling tired with generalized weakness for over one week with mild dyspnea on exertion. Patient reports having shortness of breath at rest with occasional orthopnea  Clinical Impression  Pt with difficulty moving around at baseline but typically able to perform basic ADls for herself per pt report. Pt currently with decreased activity tolerance, transfers and mobility as well as below deficits who will benefit from acute therapy to maximize strength, mobility, balance and function to decrease burden of care prior to discharge. Pt with sensitive skin and easily bruises. Pt reports 2 falls in the last year.     PT Assessment  Patient needs continued PT services    Follow Up Recommendations  Home health PT;Supervision/Assistance - 24 hour    Does the patient have the potential to tolerate intense rehabilitation      Barriers to Discharge   pt reports son available 24hrs/day but son not present to confirm    Equipment Recommendations  None recommended by PT    Recommendations for Other Services     Frequency Min 3X/week    Precautions / Restrictions Precautions Precautions: Fall   Pertinent Vitals/Pain 99% on RA Tender to touch for skin     Mobility  Bed Mobility Bed Mobility: Rolling Left;Left Sidelying to Sit;Sitting - Scoot to Edge of Bed Rolling Left: 4: Min guard;With rail Left Sidelying  to Sit: 4: Min assist;HOB flat;With rails Sitting - Scoot to Edge of Bed: 5: Supervision Details for Bed Mobility Assistance: cueing for sequence to ease transfer, increased time  to complete Transfers Transfers: Sit to Stand;Stand to Sit Sit to Stand: 4: Min guard;From bed Stand to Sit: 4: Min guard;To chair/3-in-1 Details for Transfer Assistance: cueing for hand placement with increased time to complete Ambulation/Gait Ambulation/Gait Assistance: 4: Min guard Ambulation Distance (Feet): 20 Feet Assistive device: Rolling walker Ambulation/Gait Assistance Details: cueing for posture and pt fatigue limited distance Gait Pattern: Shuffle;Trunk flexed Gait velocity: decreased Stairs: No    Exercises General Exercises - Lower Extremity Heel Slides: AROM;Seated;Both;10 reps Hip ABduction/ADduction: AROM;Both;10 reps;Seated   PT Diagnosis: Difficulty walking;Generalized weakness  PT Problem List: Decreased strength;Decreased activity tolerance;Decreased mobility;Decreased knowledge of use of DME PT Treatment Interventions: Gait training;DME instruction;Functional mobility training;Therapeutic activities;Therapeutic exercise;Patient/family education     PT Goals(Current goals can be found in the care plan section) Acute Rehab PT Goals Patient Stated Goal: return home with son PT Goal Formulation: With patient Time For Goal Achievement: 12/27/12 Potential to Achieve Goals: Fair  Visit Information  Last PT Received On: 12/13/12 Assistance Needed: +1 History of Present Illness: 79 year old female with history of chronic bronchitis, hypertension, history of CHF, aortic stenosis, macular degeneration, peripheral vascular disease, hyperlipidemia, GERD, hiatal hernia, chronic gout, osteoporosis, anemia who was brought into the ED by her family for progressive shortness of breath for past 4 days. Patient reports that she has been feeling tired with generalized weakness for over one week with  mild dyspnea on exertion. Patient reports having shortness of breath at rest with occasional  orthopnea       Prior Functioning  Home Living Family/patient expects to be discharged to:: Private residence Living Arrangements: Children Available Help at Discharge: Available 24 hours/day Type of Home: House Home Access: Ramped entrance Home Layout: One level Home Equipment: Environmental consultant - 2 wheels;Wheelchair - manual;Cane - single point;Bedside commode;Shower seat Prior Function Level of Independence: Needs assistance Gait / Transfers Assistance Needed: pt states she can typically move about the house without assist with RW ADL's / Homemaking Assistance Needed: son does all housework and cooking. Son assist with bra with dressing and pt states he stays nearby if she is in the shower (1x/wk) but that she performs all bathing without assist and dresses  Communication Communication: No difficulties    Cognition  Cognition Arousal/Alertness: Awake/alert Behavior During Therapy: Flat affect Overall Cognitive Status: Within Functional Limits for tasks assessed    Extremity/Trunk Assessment Upper Extremity Assessment Upper Extremity Assessment: Generalized weakness Lower Extremity Assessment Lower Extremity Assessment: Generalized weakness Cervical / Trunk Assessment Cervical / Trunk Assessment: Kyphotic   Balance    End of Session PT - End of Session Activity Tolerance: Patient limited by fatigue Patient left: in chair;with call bell/phone within reach;with nursing/sitter in room Nurse Communication: Mobility status  GP     Andrea Rubio 12/13/2012, 1:34 PM Andrea Rubio, PT [phone]

## 2012-12-14 ENCOUNTER — Telehealth: Payer: Self-pay | Admitting: Pulmonary Disease

## 2012-12-14 LAB — BASIC METABOLIC PANEL
BUN: 19 mg/dL (ref 6–23)
CO2: 26 mEq/L (ref 19–32)
Calcium: 9.2 mg/dL (ref 8.4–10.5)
Chloride: 93 mEq/L — ABNORMAL LOW (ref 96–112)
Creatinine, Ser: 0.56 mg/dL (ref 0.50–1.10)
GFR calc Af Amer: >90 mL/min (ref >90)
GFR calc Af Amer: >90 mL/min (ref >90)
GFR calc non Af Amer: 81 mL/min — ABNORMAL LOW (ref >90)
GFR calc non Af Amer: 83 mL/min — ABNORMAL LOW (ref >90)
Glucose, Bld: 211 mg/dL — ABNORMAL HIGH (ref 70–99)
Potassium: 6.2 mEq/L — ABNORMAL HIGH (ref 3.5–5.1)
Sodium: 136 mEq/L (ref 135–145)
Sodium: 133 mEq/L — ABNORMAL LOW (ref 135–145)

## 2012-12-14 MED ORDER — AZITHROMYCIN 500 MG PO TABS: 500.0000 mg | ORAL_TABLET | ORAL | Status: DC

## 2012-12-14 NOTE — Telephone Encounter (Signed)
I spoke with Andrea Rubio. I scheduled pt an appt with TP on 12/16/12 at 10:45. Nothing further needed

## 2012-12-14 NOTE — Care Management Note (Signed)
    Page 1 of 1   12/14/2012     4:21:59 PM   CARE MANAGEMENT NOTE 12/14/2012  Patient:  Andrea Rubio, Andrea Rubio   Account Number:  [id number]  Date Initiated:  12/14/2012  Documentation initiated by:  Oletta Cohn  Subjective/Objective Assessment:   79 yo female Chief Complaint: Progressive SOB for past 4 days/ home with son     Action/Plan:   IV diureses/ home with [facility]   Anticipated DC Date:  12/14/2012   Anticipated DC Plan:  HOME W HOME HEALTH SERVICES      DC Planning Services  CM consult      [facility] Choice  HOME HEALTH   Choice offered to / List presented to:  C-1 Patient        HH arranged  HH-2 PT      HH agency  [facility]   Status of service:  Completed, signed off Medicare Important Message given?   (If response is "NO", the following Medicare IM given date fields will be blank) Date Medicare IM given:   Date Additional Medicare IM given:    Discharge Disposition:    Per UR Regulation:    If discussed at Long Length of Stay Meetings, dates discussed:    Comments:  12/14/12 Oletta Cohn, RN, BSN, NCM [phone]  Spoke with pt at bedside regarding discharge planning for Home Health Services. Offered pt list of home health agencies to choose from.  Pt chose [facility] to render services. Andrea Furnish, RN of [facility] notified.  No DME needs identified at this time.

## 2012-12-14 NOTE — Progress Notes (Signed)
ANTIBIOTIC CONSULT NOTE - FOLLOW UP  Pharmacy Consult for renal dose antibiotics  Indication: pneumonia  Allergies  Allergen Reactions  . Allopurinol     REACTION: rash  . Codeine   . Hydrocodone     REACTION: hallucinations  . Morphine     REACTION: hallucinations    Patient Measurements: Height: 4\' 11"  (149.9 cm) Weight: 120 lb 11.2 oz (54.749 kg) (bed scale) IBW/kg (Calculated) : 43.2   Vital Signs: Temp: 97.6 F (36.4 C) (11/12 0537) Temp src: Oral (11/12 0537) BP: 135/63 mmHg (11/12 0537) Pulse Rate: 73 (11/12 0537) Intake/Output from previous day: 11/11 0701 - 11/12 0700 In: 783 [P.O.:780; I.V.:3] Out: 1755 [Urine:1751; Stool:4] Intake/Output from this shift:    Labs:  Recent Labs  12/11/12 1208 12/12/12 0405 12/13/12 0707  WBC 22.9*  --   --   HGB 14.1  --   --   PLT 206  --   --   CREATININE 0.62 0.68 0.62   Estimated Creatinine Clearance: 38.8 ml/min (by C-G formula based on Cr of 0.62). No results found for this basename: VANCOTROUGH, Leodis Binet, VANCORANDOM, GENTTROUGH, GENTPEAK, GENTRANDOM, TOBRATROUGH, TOBRAPEAK, TOBRARND, AMIKACINPEAK, AMIKACINTROU, AMIKACIN,  in the last 72 hours   Microbiology: Recent Results (from the past 720 hour(s))  CULTURE, BLOOD (ROUTINE X 2)     Status: None   Collection Time    12/11/12  5:20 PM      Result Value Range Status   Specimen Description BLOOD LEFT HAND   Final   Special Requests BOTTLES DRAWN AEROBIC ONLY 5CC   Final   Culture  Setup Time     Final   Value: 12/11/2012 23:42     Performed at [facility]   Culture     Final   Value:        BLOOD CULTURE RECEIVED NO GROWTH TO DATE CULTURE WILL BE HELD FOR 5 DAYS BEFORE ISSUING A FINAL NEGATIVE REPORT     Performed at [facility]   Report Status PENDING   Incomplete  CULTURE, BLOOD (ROUTINE X 2)     Status: None   Collection Time    12/11/12  5:25 PM      Result Value Range Status   Specimen Description BLOOD RIGHT HAND   Final   Special Requests BOTTLES DRAWN AEROBIC ONLY 0.5CC   Final   Culture  Setup Time     Final   Value: 12/11/2012 23:42     Performed at [facility]   Culture     Final   Value:        BLOOD CULTURE RECEIVED NO GROWTH TO DATE CULTURE WILL BE HELD FOR 5 DAYS BEFORE ISSUING A FINAL NEGATIVE REPORT     Performed at [facility]   Report Status PENDING   Incomplete    Anti-infectives   Start     Dose/Rate Route Frequency Ordered Stop   12/11/12 1700  cefTRIAXone (ROCEPHIN) 1 g in dextrose 5 % 50 mL IVPB     1 g 100 mL/hr over 30 Minutes Intravenous Every 24 hours 12/11/12 1647 12/18/12 1659   12/11/12 1700  azithromycin (ZITHROMAX) tablet 500 mg     500 mg Oral Every 24 hours 12/11/12 1647 12/18/12 1659      Assessment: 79 yo lady on ceftriaxone and azithromycin for PNA.  Doses are appropriate.  Goal of Therapy:  Eradication of infection  Plan:  Cont antibiotics as prescribed. Pharmacy will sign off.  Please  advise if we can be of further assistance.  Andrea Rubio 12/14/2012,9:05 AM

## 2012-12-14 NOTE — Progress Notes (Signed)
`  1630 dischare instructions given to pt and daughter . Verbalized understanding.

## 2012-12-14 NOTE — Progress Notes (Signed)
Met with patient at bedside to explain and offer [facility] Care Management services. She reports she lives with her son, Chrissie Noa. She states she plans on going home at discharge. Noted therapy recommends HHC PT services. She could also benefit from [facility] RN for CHF management as well. Patient states she has a scale but thinks it is now broken. States "we have to get another one". She states she feels like she has been managing well at home and not sure what happened. She also reports she follows up with her PCP closely. Explained that [facility] will not interfere with services provided by home health. Will make inpatient RNCM aware that [facility] Care Management will follow at discharge. Left [facility] Care Management packet at bedside.  Raiford Noble, MSN-Ed, RN,BSN- [facility] [phone]

## 2012-12-16 ENCOUNTER — Encounter: Payer: Self-pay | Admitting: Adult Health

## 2012-12-16 ENCOUNTER — Ambulatory Visit (INDEPENDENT_AMBULATORY_CARE_PROVIDER_SITE_OTHER)
Admission: RE | Admit: 2012-12-16 | Discharge: 2012-12-16 | Disposition: A | Discharge status: Home / Self Care | Payer: Medicare PPO | Source: Ambulatory Visit | Attending: Adult Health | Admitting: Adult Health

## 2012-12-16 ENCOUNTER — Ambulatory Visit (INDEPENDENT_AMBULATORY_CARE_PROVIDER_SITE_OTHER): Payer: Medicare PPO | Admitting: Adult Health

## 2012-12-16 ENCOUNTER — Other Ambulatory Visit (INDEPENDENT_AMBULATORY_CARE_PROVIDER_SITE_OTHER): Payer: Medicare PPO

## 2012-12-16 VITALS — BP 130/74 | HR 73 | Temp 98.3°F | Wt 127.4 lb

## 2012-12-16 DIAGNOSIS — J189 Pneumonia, unspecified organism: Secondary | ICD-10-CM

## 2012-12-16 DIAGNOSIS — I359 Nonrheumatic aortic valve disorder, unspecified: Secondary | ICD-10-CM

## 2012-12-16 DIAGNOSIS — I509 Heart failure, unspecified: Secondary | ICD-10-CM

## 2012-12-16 LAB — CBC WITH DIFFERENTIAL/PLATELET
Basophils Absolute: 0 10*3/uL (ref 0.0–0.1)
Basophils Relative: 0 % (ref 0.0–3.0)
Eosinophils Absolute: 0 10*3/uL (ref 0.0–0.7)
Eosinophils Relative: 0 % (ref 0.0–5.0)
Hemoglobin: 13.9 g/dL (ref 12.0–15.0)
Lymphs Abs: 0.3 10*3/uL — ABNORMAL LOW (ref 0.7–4.0)
MCHC: 32.9 g/dL (ref 30.0–36.0)
MCV: 91.9 fl (ref 78.0–100.0)
Monocytes Absolute: 0.5 10*3/uL (ref 0.1–1.0)
Neutrophils Relative %: 97.3 % — ABNORMAL HIGH (ref 43.0–77.0)
Platelets: 188 10*3/uL (ref 150.0–400.0)
RDW: 15.4 % — ABNORMAL HIGH (ref 11.5–14.6)
WBC: 31.5 10*3/uL (ref 4.5–10.5)

## 2012-12-16 LAB — BASIC METABOLIC PANEL
BUN: 25 mg/dL — ABNORMAL HIGH (ref 6–23)
Chloride: 95 mEq/L — ABNORMAL LOW (ref 96–112)
Creatinine, Ser: 0.8 mg/dL (ref 0.4–1.2)
GFR: 68.36 mL/min (ref >60.00)
Glucose, Bld: 121 mg/dL — ABNORMAL HIGH (ref 70–99)

## 2012-12-16 LAB — BRAIN NATRIURETIC PEPTIDE: Pro B Natriuretic peptide (BNP): 859 pg/mL — ABNORMAL HIGH (ref 0.0–100.0)

## 2012-12-16 MED ORDER — LEVOFLOXACIN 500 MG PO TABS: 500.0000 mg | ORAL_TABLET | Freq: Every day | ORAL | Status: AC

## 2012-12-16 NOTE — Patient Instructions (Signed)
Begin Levaquin 500mg  daily for 5 days  You are done with Zithromax today .  Continue on current regimen  follow up Dr. Kriste Basque  Next month as planned and As needed   Please contact office for sooner follow up if symptoms do not improve or worsen or seek emergency care

## 2012-12-16 NOTE — Assessment & Plan Note (Signed)
Continue follow up with Dr. Bubba Hales  Continue on current regimen .

## 2012-12-16 NOTE — Progress Notes (Signed)
Subjective:    Patient ID: Andrea Rubio, female    DOB: March 17, 1925, 79 y.o.   MRN: [id number]  HPI 79 y/o WF here for a follow up visit... she has multiple medical problems as noted below...   ~  May 27, 2011:  65mo ROV & Tramya fell in her bathroom several weeks ago- hit the back of her head & left arm abrasion; EMS checked her & dressed her wound and she decided to see her local physician DrWElkins; he redressed the left arm abrasion & reassured her;  We rechecked her occipital area- sm scab ok to wash etc, and left arm abrasion healing nicely; She knows to be more careful to avoid falls etc... We reviewed her meds, problem list, & prev labs> see below>>    She tells me she saw [facility] for Cards f/u about a month ago> stable, no changes made (we don't get notes from him)...  ~  October 07, 2011:  65mo ROV     She saw Urology 7/13 for Estring change> this has been controlling her dysuria & is done every 5mo...    She is followed by Susann Givens for Ortho> L1 compression fx ~4/13; on Tramadol & Miacalcin NS, Vit D, & Calcium... We reviewed prob list, meds, xrays and labs> see below>> She requests refill of Nizoral shampoo- ok... LABS 9/13:  FLP- not at goals on Simva40, reminded to take everyday;  Chems- wnl;  CBC- wnl  ~  February 10, 2012:  65mo ROV & Andrea Rubio has had a refractory AB episode w/ persist cough & discolored mucus; now on Avelox & we reviewed the need to maximize her Rx w/ Pred 5mg /d, NEBS Tid, Advair250Bid, Mucinex-2Bid, Fluids, etc... BP controlled on Lisin + Lasix;  Cards followed by [id number];  She was started on Atorva20 but didn't bring her med bottles to review (as requested)...    We reviewed prob list, meds, xrays and labs> see below for updates >>   ~  March 14, 2012:  38mo ROV & Andrea Rubio has improved but not all the way resolved from her recent AB exac (see below); We reviewed the following medical problems during today's office visit >>     AB> she had AB exac last month &  Rx w/ Avelox, Pred, Nebs, Advair250, Mucinex, etc; she is improved but not back to baseline & we are forced to bump Pred to 20mg  tabs- 4d taper + NEBS Qid, max Mucinex, etc...    HBP> now on Cozaar25, Lasix40, K20; BP=122/82 & she denies CP, palpit, ch in SOB, edema, etc...    Cardiac> severeAS & CHF> followed by [id number], meds reviewed, we don't have notes from him...    Hyperlipid> states she is off the Lip20; she did not bring med list or bottles to review; asked to have Cards send notes to me & f/u FLP off meds...    HH, GERD, Divertics> on Prilosec20 & Metaclop10qhs; sister died w/ colon ca- pts last colonoscopy 6/06 by [facility] w/o polyps (+divertics & ischemic colitis)...    DJD, Gout, LBP> on Uloric40 & Tramadol50 prn; she has severe deformities & difficulty getting up & about; she desperately needs more PT but she refuses...    Osteoporosis> on Miacalcin NS;     Anxiety> she takes [id number] but declines anxiolytic rx... We reviewed prob list, meds, xrays and labs> see below for updates >>  LABS 2/14:  Chems- wnl;  CBC- wnl;  BNP= 427...   ~  April 20, 2012:  75mo ROV & Andrea Rubio has stabilized by bumping up Pred, now weaned back down to 5mg /d plus her max regimen... She is managing as best she can w/ help of her son at home; notes some pain in her left hip- known osteoporosis & BMD 3/14 showed TScores +0.7 in Spine, and -2.3 in left Fem Neck, and -1.9 in Forearm radius; she's been on the Miacalcin NS "it seems to be helping" she says, therefore continue the same for now...  BP stable, denies CP, palpit, ch in SOB etc... She has severe AS followed by [facility] but apparently there is not much he can do...    We reviewed prob list, meds, xrays and labs> see below for updates >> we will recheck her in 2-3 months...  ~  May 24, 2012:  75mo ROV & in the interval Andrea Rubio had yet another exac characterized by incr cough, yellow sput, congestion, wheezing & SOB; we called in Levaquin, then had to bump the  Pred back up (first to 10mg /d, then to 20mg /d);  She reports that she is a lot better now on the Pred20- "I'm better than I've been the whole time"- and we discussed a SLOW wean to try to determine her threshold dose> for now decr to 20 alt w/ 10mg  Qod & f/u 75mo...  She will also continue on her Miacalcin NS, Calcium, MVI, VitD 1000u/d...    We reviewed prob list, meds, xrays and labs> see below for updates >>   ~  Jun 22, 2012:  75mo ROV & Andrea Rubio has been on Pred20mg - taking one tab alt w/ 1/2 tab Qod for the last month; pt states "I'm a lot better, just can't get over the bronchitis from last fall";  She has lost 3# & edema is diminished- BP= 124/80, Chest exam w/ exp rhonchi & she is reminded to take the Mucinex regularly;  I gave her & son the option of continuing the Pred 20-10 vs wean further to 10mg /d & they prefer the latter (told it was ok to take an extra 10mg  Pred on any day she noted incr trouble breathing)...     We reviewed prob list, meds, xrays and labs> see below for updates >>   ~  July 27, 2012:  75mo ROV & Andrea Rubio reports that she is "doing pretty good", has cut down her Pred to 10mg /d & rarely needs the extra 10mg  on any given day; she does not like the hot weather; her son wonders if Singulair would help her & we agreed to write a new Rx for this as a trial;  We also rec adding Flutter valve therapy to aide in mucocilliary production...  BP is stable at 118/68 and O2 sat= 96% on room air...  ~  September 21, 2012:  35mo ROV & Andrea Rubio has had an exac w/ incr cough, change in phlegm to thick dark sput, and sl more SOB but no f/c/s, no CP, etc;  She tried singulair (per son's request) but no better therefore rec to stop this add-on med;  Asked to use Flutter valve and incr efforts at improving mucociliary function & sputum clearance but she never did & doesn't think she can do it effectively (?really);  Exam shows stable VS, sats 96% on RA, chest w/ bibasilar rhonchi;  CXR shows chr changes w/o acute  dis & Labs showed elev WBC=20K w/ elev sed=43;  We decided to treat w/ Levaquin empirically, bump Pred 10=>20/d, and continue vigorous efforts at mucus clearance.Marland KitchenMarland Kitchen  AB, COPD> on Pred, Nebs, Advair250, Mucinex, etc; see above>     HBP> now on Cozaar25, Lasix40, K20; BP=118/68 & she denies CP, palpit, ch in SOB, edema, etc...    Cardiac> severeAS & CHF> on ASA + above; followed by ZOXWRUEAV- we don't have notes from him...    Hyperlipid> Lip20- still on her list; she did not bring med list or bottles to review; asked to have Cards send notes to me & f/u FLP when able...    HH, GERD, Divertics> on Prilosec20 & Metaclop10qhs; sister died w/ colon ca- pts last colonoscopy 6/06 by [facility] w/o polyps (+divertics & ischemic colitis)...    DJD, Gout, LBP> on Uloric40 & Tramadol50 prn; she has severe deformities (bilat shoulder arthroplasties) & difficulty getting up & about; she desperately needs more PT but she refuses...    Osteoporosis> on Miacalcin NS, calcium, MVI, VitD; BMD 3/14 showed TScore -2.3 in left [facility]; continue same...    Anxiety> she takes [id number] but declines anxiolytic rx... We reviewed prob list, meds, xrays and labs> see below for updates >>  LABS 8/14:  Chems- ok x BS=134;  CBC- ok x WBC=20K;  BNP=484;  Sed=43;  Sput= NTF only... CXR 8/14 showed cardiomeg, atherosclerosis of Ao, chr changes but clear lungs/ NAD, bilat shoulder arthroplasties, compressionL1...  ~  October 24, 2012:  40mo ROV & Andrea Rubio indicates that she improved on the Levaquin & w/ incr in Pred to 20mg /d after last OV but notes that her bronchitis symptoms returned w/ cough & brown sputum by her hx;  We decided to treat w/ Keflex 500mg Tid & try to decr the Pred to 20mg  alt w/ 10mg  Qod... Follow up 40mo recheck.    We reviewed prob list, meds, xrays and labs> see below for updates >>   ~  December 07, 2012:  40mo ROV & Andrea Rubio's son reports that he tried to decr the Pred to 20-10 Qod but she had incr difficulty  breathing & he bumped her up to 20mg /d again; she finished the Keflex, & is using the Nebulizer, Mucinex, & Chest PT/ postural drain as best she can; she notes increased congestion at nights & we discussed the roll of nocturnal reflux in this symptom- rec to elev HOB 6", incr Omep20 to Bid & continue the Reglan10mg Qhs;  She also notes some sharp CP under left breast- worse w/ cough & we reviewed Rx w/ rest, heat, cough syrup prn; Note> there are no other substantial interventions avail for treatment & we may have to discuss hospice care before too long... Once again asked to try to decr the Pred to 20-10Qod if able... Follow up visit in 6 wks.    12/16/2012 Post Hospital follow up  Returns for a post hospital follow up .  Admitted 11/9 for CHF and PNA  2 D echo showed grade 2 diastolic dysfunction and critical AV stenosis. .  She has known AV disease  Previous  2DEcho 1/12 in [place] showed severeAS & mildAI, mild conc LVH, norm sys func w/ EF=60-65%, HK at apex, grI DD...followed by Dr. Algie Coffer .  Tx w/ aggressive diuresis. Started on IV abx and discharged on azithromycin. Last dose today.  Says she is still very weak from hospital  , still has some cough and dyspena.  Wants home PT set up .  Today CXR shows improvement and increased aeration on left lung consolidation.  She denies any fever, appetite is fair.  Son helps her with her ADLs. No n/v/d, chest  pain or orthopnea.  Today BNP trended down from ~11K to ~800  WBC is tr up from ~20k to 31K  Continues on Prednisone 20mg  daily          Problem List:    MACULAR DEGENERATION (ICD-362.50) - she is legally blind & son helps out at home...  SINUSITIS, CHRONIC (ICD-473.9) - she is s/p bliat [facility] 1986 by DrKraus... ~  CT Sinus 11/09 showed chr sinusitis changes in the max & sphenoid regions... ~  8/12: she is c/o drippy nose, trial Atrovent nasal spray...  ASTHMATIC BRONCHITIS, ACUTE (ICD-466.0) - on PRED 5mg /d, ADVAIR 250Bid, &  ALBUTEROL NEBS Tid... stable- min cough, w/o sputum, no change in DOE, etc... ~  PFT's 3/06 showed FVC= 1.49 (68%), FEV1= 1.12 (75%), %1sec=75, mid-flows= 43%pred... ~  [facility] 8/09 w/ bilat pneumonia... see DC Summary. ~  CXR 10/09 showed stable cardiomeg, tort Ao, clear lungs, bilat shoulder arthroplasties... ~  CT Angio Chest 11/09 showed neg for PE, fluid/ mucus plugs in depend LL bronchi... ~  CXR 10/10 showed stable cardiomeg, bilat humeral head prostheses, NAD.Marland Kitchen. ~  CXR 1/12 showed COPD, chr changes, ?right basilar opacity ==> subsequent films w/ incr edema. ~  CXR 5/12 showed cardiomeg, sm right effusion, no edema, bilat shoulder arthroplasties, NAD... ~  10/12:  AB exac treated by TP w/ Augmentin & improved... ~  1/14:  AB exac treated w/ Avelox, Pred taper, NEBS qid, Advair250, Mucinex, etc... ~  CXR 1/14 showed borderline heart size, sm bilat effusions and basilar atx...  ~  3/14: back on Pred 5mg /d and stable once again=> then had yet another exac requiring Rx w/ Levaquin, incr Pred to 20mg /d... ~  4/14:  Much improved on Pred20/d & we decided to decr to 20-10 Qod & recheck 61mo... ~  5/14:  She appears stable & wants to attempt further Pred taper to 10mg /d w/ ROV 102mo=> stable continue the same... ~  8/14:  COPD exac treated w/ Levaquin, bump Pred to 20, continue max Rx=> CXR w/ chr changes, Sput=> NTF; pt reports improved on rx... ~  9/14:  She reports initially better then w/ recurrent cough, dark sputum, no CP/ ch in SOB, f/c/s; we decided to treat again w/ Keflex 500mg  Tid & try to wean the Pred 20-10 Qod.  HYPERTENSION (ICD-401.9) >>  ~  8/12: her BP's have stabilized at home and measures 112/68 here today... she denies CP, palpit, syncope, ch in edema... ~  12/12:on Lisin5/d, Lasix40/d & K20/d; BP= 126/72 & clinically stable w/o CP, palpit, ch in SOB, etc... ~  4/13: on Lisin5/d, Lasix40/d & K20/d; BP= 108/62 & usually sl better at home; still denies any CP, palpit, ch in SOB,  etc... ~  2/14: now on Cozaar25, Lasix40, K20; BP=122/82 & she denies CP, palpit, ch in SOB, edema, etc ~  5/14: on Cozaar25, Lasix40, K20; BP=124/80 & she denies CP, palpit, ch in SOB, edema, etc... ~  8/14: on Cozaar25, Lasix40, K20; BP=118/68 & she denies CP, palpit, ch in SOB, edema, etc... ~  9/14: on same meds- BP=110/72  AORTIC STENOSIS (ICD-424.1)   << followed by ZOXWRUEAV for Cards >> Hx of CHF (ICD-428.0)  ~  2DEcho 3/06 showed mild AS/AI w/ mod incr AoV thickness & reduced leaflet excursion, mild MR, norm EF=55-65%...  ~  2DEcho 12/08 showed mod inc AoV thickness, mod reduced AV leaflet excursion, c/w mod AS, mild AI & mean grad= ;  norm LVF w/ EF= 65% and no wall  motion abn etc... ~  2DEcho 1/12 in [place] showed severeAS & mildAI, mild conc LVH, norm sys func w/ EF=60-65%, HK at apex, grI DD... ~  EKG 1/12 showed NSR, poor R progression, NSSTTWA... ~  We do not have follow up notes from [facility], pt indicates no change in meds...  PERIPHERAL VASCULAR DISEASE (ICD-443.9) - on ASA 81mg /d... she can't walk enough to elicit discomfort, no rest pain or lesions... ~  ABI's 12/01 showed .88 on R, and .95 on L... ~  MRI showed small vessel dis & small infarct noted...  VENOUS INSUFFICIENCY (ICD-459.81) - on low sodium diet, Lasix, KCl...  HYPERLIPIDEMIA (ICD-272.4) - prev on Zocor 40mg  per ZOXWRUEAV- off now;  (prev on Pravachol, but stated intol to other Statins)... takes Fish Oil ~2000mg /d rec by her ophthalmologist... ~  FLP 9/05 ?on Prav40 showed TChol 176, TG 190, HDL 55, LDL 83... ~  FLP 4/10 on diet alone showed TChol 211, TG 153, HDL 44, LDL 128... she refuses med Rx- continue diet efforts. ~  1/12:  Hosp by [facility] & consulted DrKadakia who started her on ZOCOR40mg /d> he is checking her labs now... ~  12/12: she crossed Simva40 off her list ?stopped on her own or ?stopped by [place]... ~  2013:  She still has Simva40 on her list here but NEVER brings a list from [place] or her  med bottles to review... ~  9/13:  FLP here showed TChol 206, TG 167, HDL 48, LDL 133... She has agreed to try ATORVASTATIN 20mg /d... ~  2/14: she reports that she is off all statin meds, on diet alone... ~  8/14: med list includes Lip20- she did not bring meds to office, we have never seen notes from [place]...  HIATAL HERNIA (ICD-553.3) - on PRILOSEC 20mg /d & METACLOPRAMIDE 10mg Qhs... GERD (ICD-530.81) - EGD 3/06 by DrStark showed 3cmHH, mild esophagitis...  DIVERTICULOSIS, COLON (ICD-562.10) - last colonoscopy was 6/06 by [facility] showed divertics and ischemic colitis...  ~  Adm 1/12 by [facility] w/ Diverticulitis, treated & improved, she has not followed up w/ GI; she take 1/2 capful of MIRALAX daily.  STRESS INCONTINENCE (ICD-788.39) - she has hx UTI's & dysuria on & off... ~  10/10: urine c/s +EColi & Rx'd w/ Cipro. ~  7/12: she reports vaginal estrogen per Urology has helped her urinary symptoms; note from DrOttelin reviewed. ~  Labs 12/12 w/ normal renal function & Creat=0.7 ~  10/13: she had f/u DrOttelin> Hx trophic vaginitis, dysuria, & recurrent UTIs; rec topical estrogen=> estring (he replaced it that day); PVR=100; he rec ROV in 73mo for estring replacement. ~  2/14 & 5/14: f/u post menopausal atrophic vaginitis, chr cystitis, on Estring but cannot replace it herself- seen Q73mo for this purpose... ~  8/14: she saw Urology for belated estring change, hx atrophic vaginitis, chr cystitis & dysuria...   DEGENERATIVE JOINT DISEASE (ICD-715.90) - she has severe DJD & Gout w/ deformities- Foot XRay showed Charcot foot deformity, severe degenerative dis, etc...  added TRAMADOL + TYLENOL Prn... GOUT, UNSPECIFIED (ICD-274.9) - labs 4/10 showed Uric Acid level= 7.9.Marland KitchenMarland Kitchen she has hx of gout & hyperuricemia but allergic to Allopurinol w/ rash... therefore on ULORIC 40mg /d... Uric Acid level 5/11 on Uloric40 = 3.2 LOW BACK PAIN (ICD-724.2) NECK PAIN (ICD-723.1) - eval by Susann Givens ==> DrLewitt 2010 w/ MRI & Rx   w/ Lidoderm, Tylenol, Lyrica- now on NEURONTIN... OSTEOPOROSIS (ICD-733.00) - prev on Fosamax (stopped 1/12 in hosp "my kidneys can't handle it"); on Ca++, Vits, &  Vit D... ~  labs 4/10 showed Vit D level = 28... rec> start Vit D OTC 1000 u daily. ~  labs 5/11 showed Vit D level = 32... rec incr Vit D to 2000 u daily. ~  2013:  She fell w/ L1 compression & managed by Susann Givens w/ MIACALCIN NS & Tramadol... ~  3/14:  BMD here showed TScores +0.7 in Spine, and -2.3 in left Fem Neck, and -1.9 in Forearm radius; she feels the Miacalcin is helping, therefore continue same...  ANXIETY (ICD-300.00) - off prev Alpraz Rx & takes AMITRIPTYLINE 25mg  Qhs... under stress w/ 59 y/o son had MI & stent in 2010.  Hx of ANEMIA (ICD-285.9) - hx anemia req 2U transfusion 6/06 hosp for ischemic colitis...  ~  Hg= 12 - 13 over 2008-9. ~  labs 8/09 hosp showed Hg= 14 ~  labs 4/10 showed Hg= 12.2 ~  labs 3/11 showed Hg= 12.5, MCV= 92, Fe= 22 (9%sat)... start FeSO4 Bid. ~  labs 5/11 showed = 13.0, MCV= 94, Fe= 57 (23%)... continue Fe supplement. ~  Labs 12/12 showed Hg= 13.1, Fe= 35, (14%sat)... rec to continue Fe+VitC Bid... ~  Labs 9/13 showed Hg= 13.7, MCV=94 ~  Labs 8/14 showed Hg= 14.3, MCV= 91  DERM>  She has skin cancer removed from the right side of her face by DrDanJones (Spring2012)...   Past Surgical History  Procedure Laterality Date  . Lumbar laminectomy    . Shoulder surgery      bilateral    Outpatient Encounter Prescriptions as of 12/16/2012  Medication Sig  . albuterol (PROVENTIL) (2.5 MG/3ML) 0.083% nebulizer solution Take 3 mLs (2.5 mg total) by nebulization 4 (four) times daily.  Marland Kitchen amitriptyline (ELAVIL) 25 MG tablet Take 25 mg by mouth at bedtime.  Marland Kitchen aspirin 81 MG tablet Take 81 mg by mouth daily.    Marland Kitchen atorvastatin (LIPITOR) 20 MG tablet Take 20 mg by mouth daily.  . Azelastine-Fluticasone 137-50 MCG/ACT SUSP Place 1-2 sprays into the nose 2 (two) times daily as needed.  . calcitonin,  salmon, (MIACALCIN/FORTICAL) 200 UNIT/ACT nasal spray Place 1 spray into the nose daily.  . Calcium Carbonate-Vitamin D (CALCIUM 600+D) 600-400 MG-UNIT per tablet Take 1 tablet by mouth daily.   . Cholecalciferol (VITAMIN D) 1000 UNITS capsule Take 1,000 Units by mouth daily.    . clotrimazole-betamethasone (LOTRISONE) cream Apply 1 application topically 2 (two) times daily.  Marland Kitchen conjugated estrogens (PREMARIN) vaginal cream daily.   . febuxostat (ULORIC) 40 MG tablet Take 40 mg by mouth daily.  . ferrous gluconate (FERGON) 325 MG tablet Take 325 mg by mouth daily with breakfast.   . Fluticasone-Salmeterol (ADVAIR DISKUS) 250-50 MCG/DOSE AEPB Inhale 1 puff into the lungs 2 (two) times daily.  . furosemide (LASIX) 40 MG tablet Take 40 mg by mouth daily.  . Guaifenesin 1200 MG TB12 Take 1 tablet by mouth 2 (two) times daily.  Marland Kitchen ipratropium (ATROVENT) 0.06 % nasal spray Place 2 sprays into both nostrils 4 (four) times daily.  Marland Kitchen ipratropium-albuterol (DUONEB) 0.5-2.5 (3) MG/3ML SOLN Take 3 mLs by nebulization 4 (four) times daily as needed.  Marland Kitchen ketoconazole (NIZORAL) 2 % shampoo Use as directed  . losartan (COZAAR) 25 MG tablet Take 25 mg by mouth daily.  . metoCLOPramide (REGLAN) 10 MG tablet Take 10 mg by mouth at bedtime.  . montelukast (SINGULAIR) 10 MG tablet Take 1 tablet by mouth daily.  . Multiple Vitamin (MULTIVITAMIN) capsule Take 1 capsule by mouth daily.    Marland Kitchen  omeprazole (PRILOSEC) 20 MG capsule Take 20 mg by mouth daily.  . predniSONE (DELTASONE) 20 MG tablet Take prednisone as directed  . Respiratory Therapy Supplies (FLUTTER) DEVI Use as directed  . traMADol (ULTRAM) 50 MG tablet Take 50 mg by mouth every 6 (six) hours as needed.  . [DISCONTINUED] azithromycin (ZITHROMAX) 500 MG tablet Take 1 tablet (500 mg total) by mouth daily.  Marland Kitchen levofloxacin (LEVAQUIN) 500 MG tablet Take 1 tablet (500 mg total) by mouth daily.  . predniSONE (DELTASONE) 20 MG tablet Take 10-20 mg by mouth daily with  breakfast. Alternates between 10mg  and 20 mg every other day 20-10-20-10    Allergies  Allergen Reactions  . Allopurinol     REACTION: rash  . Codeine   . Hydrocodone     REACTION: hallucinations  . Morphine     REACTION: hallucinations    Current Medications, Allergies, Past Medical History, Past Surgical History, Family History, and Social History were reviewed in [employer] record.    Review of Systems        See HPI - all other systems neg except as noted... The patient complains of dyspnea on exertion, peripheral edema, abdominal pain, indigestion/heartburn, muscle weakness, and difficulty walking.  The patient denies anorexia, fever, weight loss, weight gain, decreased hearing, hoarseness, chest pain, syncope,  headaches, hemoptysis, melena, hematochezia, hematuria, incontinence, suspicious skin lesions, depression, unusual weight change, abnormal bleeding, enlarged lymph nodes, and angioedema.     Objective:   Physical Exam    WD, Petite, sl overweight, 79 y/o WF in NAD. GENERAL:  Alert & oriented; pleasant & cooperative... HEENT:  [place]/AT, EACs-clear, TMs-wnl, NOSE-clear, THROAT-clear & wnl... known macular degen per ophthal... NECK:  Supple w/ decrROM; no JVD; normal carotid impulses w/o bruits; no thyromegaly or nodules palpated; no lymphadenopathy. CHEST:  Decr BS bilat, Coarse BS, no wheezing or rhonchi... HEART:  Regular Rhythm;  gr 2/6 SEM without rubs or gallops detected... ABDOMEN:  Soft & nontender; normal bowel sounds; no organomegaly or masses palpated... EXT: +hand deformities, w/ tophi & mod arthritic changes & contractures... right knee arthritis & severe foot deformities as well. no varicose veins/+venous insuffic/ tr edema. NEURO:  CN's intact x reduced vision... diffusely weak, without focal changes... +gait abn... DERM:  A +ecchymoses, thin skin, etc...   BNP    Component Value Date/Time   PROBNP 859.0* 12/16/2012 1110      Assessment & Plan:

## 2012-12-16 NOTE — Assessment & Plan Note (Signed)
Recent hospitalization for decompensated, acute, diastolic congestive heart failure, now improving.  BNP is trending down. Patient continue on Lasix as directed. Check bmet today

## 2012-12-16 NOTE — Discharge Summary (Signed)
Physician Discharge Summary  Andrea Rubio ZOX:[id number] DOB: Sep 13, 1925 DOA: 12/11/2012  PCP: Michele Mcalpine, MD  Admit date: 12/11/2012 Discharge date: 12/16/2012  Time spent: 35 minutes  Recommendations for Outpatient Follow-up:  1. Follow up with PCP in 1 week  2. Follow up with Dr. Algie Coffer in 1-2 weeks  Recommendations for primary care physician for things to follow:  Repeat CXR  Discharge Diagnoses:  Principal Problem:   Acute CHF (congestive heart failure) Active Problems:   HYPERLIPIDEMIA   Gout, unspecified   ANEMIA   ANXIETY   HYPERTENSION   CHF   PERIPHERAL VASCULAR DISEASE   DEGENERATIVE JOINT DISEASE   Asthmatic bronchitis , chronic   CAP (community acquired pneumonia)  Discharge Condition: stable  Diet recommendation: heart healthy  Filed Weights   12/12/12 0449 12/13/12 0611 12/14/12 0537  Weight: 57 kg (125 lb 10.6 oz) 54.3 kg (119 lb 11.4 oz) 54.749 kg (120 lb 11.2 oz)    History of present illness:  79 year old female with history of chronic bronchitis, hypertension, history of CHF, aortic stenosis, macular degeneration, peripheral vascular disease, hyperlipidemia, GERD, hiatal hernia, chronic gout, osteoporosis, anemia who was brought into the ED by her family for progressive shortness of breath for past 4 days. Patient reports that she has been feeling tired with generalized weakness for over one week with mild dyspnea on exertion. Patient reports having shortness of breath at rest with occasional orthopnea. Denies PND. She also reports productive cough with brownish phlegm. Shortness of breath did not improve with 3 nebulizer treatment at home this morning. At baseline she ambulates in and around the house with a walker.Denies any fever or chills at home. She has noticed increase in her leg swelling is for possible days. She denies any headache, blurred vision, nausea, vomiting, chest pain, palpitations, bowel or urinary symptoms. Denies any sick contacts  or recent travel.  Hospital Course:  Acute CHF (congestive heart failure) - 2D echo done with normal EF, severe AS and grade 2 diastolic dysfunction. Patient diuresed about 3.4 L while hospitalized with significant improvement in her respiratory status and at discharge she was comfortable on room air and feeling at baseline. Recommended daily weights and to continue Furosemide daily. She is regularly seen by Dr. Algie Coffer in cardiology and will need a follow up in 1-2 weeks.  Community-acquired pneumonia - with bronchitis on and off with flare-ups for the past year. She is to finish an outpatient course of Azithromycin.  Chronic bronchitis I will increase her prednisone dose to 40 mg daily. Continue scheduled nebs. Continue dulera.  Lipidemia Continue statin  Peripheral vascular disease Stable. Continue aspirin  GERD Continue PPI  Procedures:  2D echo Study Conclusions  - Left ventricle: The cavity size was normal. There was mild concentric hypertrophy. Systolic function was normal. The estimated ejection fraction was in the range of 55% to 60%. Wall motion was normal; there were no regional wall motion abnormalities. Features are consistent with a pseudonormal left ventricular filling pattern, with concomitant abnormal relaxation and increased filling pressure (grade 2 diastolic dysfunction).- Aortic valve: There was critical stenosis. Trivial regurgitation. Valve area: 0.44cm^2(VTI). Valve area: 0.4cm^2 (Vmax). - Mitral valve: Calcified annulus. Mild regurgitation. - Left atrium: The atrium was mildly dilated. - Atrial septum: No defect or patent foramen ovale was identified.   Consultations:  none  Discharge Exam: Filed Vitals:   12/14/12 0900 12/14/12 1038 12/14/12 1346 12/14/12 1439  BP:  140/80 134/87   Pulse:  125 84 90  Temp:  98.7 F (37.1 C)   TempSrc:   Oral   Resp:   18 18  Height:      Weight:      SpO2: 98%  98% 98%   General: NAD Cardiovascular: RRR, 3/6  SEM Respiratory: CTA biL  Discharge Instructions   Future Appointments Provider Department Dept Phone   01/18/2013 2:30 PM Michele Mcalpine, MD [facility] [phone]       Medication List    STOP taking these medications       potassium chloride SA 20 MEQ tablet  Commonly known as:  K-DUR,KLOR-CON      TAKE these medications       albuterol (2.5 MG/3ML) 0.083% nebulizer solution  Commonly known as:  PROVENTIL  Take 3 mLs (2.5 mg total) by nebulization 4 (four) times daily.     amitriptyline 25 MG tablet  Commonly known as:  ELAVIL  Take 25 mg by mouth at bedtime.     aspirin 81 MG tablet  Take 81 mg by mouth daily.     atorvastatin 20 MG tablet  Commonly known as:  LIPITOR  Take 20 mg by mouth daily.     Azelastine-Fluticasone 137-50 MCG/ACT Susp  Place 1-2 sprays into the nose 2 (two) times daily as needed.     calcitonin (salmon) 200 UNIT/ACT nasal spray  Commonly known as:  MIACALCIN/FORTICAL  Place 1 spray into the nose daily.     CALCIUM 600+D 600-400 MG-UNIT per tablet  Generic drug:  Calcium Carbonate-Vitamin D  Take 1 tablet by mouth daily.     clotrimazole-betamethasone cream  Commonly known as:  LOTRISONE  Apply 1 application topically 2 (two) times daily.     conjugated estrogens vaginal cream  Commonly known as:  PREMARIN  daily.     febuxostat 40 MG tablet  Commonly known as:  ULORIC  Take 40 mg by mouth daily.     ferrous gluconate 325 MG tablet  Commonly known as:  FERGON  Take 325 mg by mouth daily with breakfast.     Fluticasone-Salmeterol 250-50 MCG/DOSE Aepb  Commonly known as:  ADVAIR DISKUS  Inhale 1 puff into the lungs 2 (two) times daily.     FLUTTER Devi  Use as directed     furosemide 40 MG tablet  Commonly known as:  LASIX  Take 40 mg by mouth daily.     Guaifenesin 1200 MG Tb12  Take 1 tablet by mouth 2 (two) times daily.     ipratropium 0.06 % nasal spray  Commonly known as:  ATROVENT  Place 2 sprays  into both nostrils 4 (four) times daily.     ipratropium-albuterol 0.5-2.5 (3) MG/3ML Soln  Commonly known as:  DUONEB  Take 3 mLs by nebulization 4 (four) times daily as needed.     ketoconazole 2 % shampoo  Commonly known as:  NIZORAL  Use as directed     losartan 25 MG tablet  Commonly known as:  COZAAR  Take 25 mg by mouth daily.     metoCLOPramide 10 MG tablet  Commonly known as:  REGLAN  Take 10 mg by mouth at bedtime.     multivitamin capsule  Take 1 capsule by mouth daily.     omeprazole 20 MG capsule  Commonly known as:  PRILOSEC  Take 20 mg by mouth daily.     predniSONE 20 MG tablet  Commonly known as:  DELTASONE  Take prednisone as directed     predniSONE  20 MG tablet  Commonly known as:  DELTASONE  Take 10-20 mg by mouth daily with breakfast. Alternates between 10mg  and 20 mg every other day 20-10-20-10     traMADol 50 MG tablet  Commonly known as:  ULTRAM  Take 50 mg by mouth every 6 (six) hours as needed.     Vitamin D 1000 UNITS capsule  Take 1,000 Units by mouth daily.           Follow-up Information   Follow up with NADEL,[name], MD. Schedule an appointment as soon as possible for a visit on 12/16/2012. (@10 :45 am with Tammy,PA spoke with Mindy )    Specialty:  Pulmonary Disease   Contact information:   [address] [place] [address] [phone]       Follow up with [facility].   Contact information:   [address] [place] [address] [phone]       The results of significant diagnostics from this hospitalization (including imaging, microbiology, ancillary and laboratory) are listed below for reference.    Significant Diagnostic Studies: Dg Chest 2 View  12/16/2012   CLINICAL DATA:  Followup pneumonia  EXAM: CHEST  2 VIEW  COMPARISON:  12/11/2012  FINDINGS: Cardiac shadow is stable. The lungs are well aerated bilaterally. Improved aeration is noted in the left lung base. Some chronic changes  are noted in the right lung base. Bilateral shoulder replacements are seen. No acute bony abnormality is noted. A chronic compression deformity is noted in the lower thoracic spine.  IMPRESSION: No acute abnormality is noted. Significant improved aeration is noted in the left lung base.   Electronically Signed   By: Alcide Clever M.D.   On: 12/16/2012 11:39   Dg Chest Port 1 View  12/11/2012   CLINICAL DATA:  Shortness of breath.  EXAM: PORTABLE CHEST - 1 VIEW  COMPARISON:  09/21/2012 and 02/29/2012  FINDINGS: Lungs are adequately inflated with mild worsening airspace density over the left mid to lower lung likely a left-sided effusion with associated atelectasis versus infection. Stable small chronic right pleural fluid collection. Stable chronic perihilar prominence. Stable cardiomegaly. Remainder of the exam is unchanged.  IMPRESSION: Worsening opacification over the left mid lower lung likely a left-sided effusion with atelectasis versus infection. Stable chronic small right pleural fluid collection. Stable bilateral prominence of the perihilar markings.  Mild stable cardiomegaly.   Electronically Signed   By: Elberta Fortis M.D.   On: 12/11/2012 12:01    Microbiology: Recent Results (from the past 240 hour(s))  CULTURE, BLOOD (ROUTINE X 2)     Status: None   Collection Time    12/11/12  5:20 PM      Result Value Range Status   Specimen Description BLOOD LEFT HAND   Final   Special Requests BOTTLES DRAWN AEROBIC ONLY 5CC   Final   Culture  Setup Time     Final   Value: 12/11/2012 23:42     Performed at [facility]   Culture     Final   Value:        BLOOD CULTURE RECEIVED NO GROWTH TO DATE CULTURE WILL BE HELD FOR 5 DAYS BEFORE ISSUING A FINAL NEGATIVE REPORT     Performed at [facility]   Report Status PENDING   Incomplete  CULTURE, BLOOD (ROUTINE X 2)     Status: None   Collection Time    12/11/12  5:25 PM  Result Value Range Status   Specimen Description BLOOD  RIGHT HAND   Final   Special Requests BOTTLES DRAWN AEROBIC ONLY 0.5CC   Final   Culture  Setup Time     Final   Value: 12/11/2012 23:42     Performed at [facility]   Culture     Final   Value:        BLOOD CULTURE RECEIVED NO GROWTH TO DATE CULTURE WILL BE HELD FOR 5 DAYS BEFORE ISSUING A FINAL NEGATIVE REPORT     Performed at [facility]   Report Status PENDING   Incomplete     Labs: Basic Metabolic Panel:  Recent Labs Lab 12/12/12 0405 12/13/12 0707 12/14/12 1235 12/14/12 1300 12/16/12 1110  NA 141 140 136 133* 131*  K 2.9* 3.3* 4.2 6.2* 3.3*  CL 100 98 93* 92* 95*  CO2 27 26 26 27 25   GLUCOSE 78 103* 150* 211* 121*  BUN 23 16 19 20  25*  CREATININE 0.68 0.62 0.60 0.56 0.8  CALCIUM 8.3* 8.5 9.2 9.5 9.3   Liver Function Tests:  Recent Labs Lab 12/11/12 1208  AST 83*  ALT 71*  ALKPHOS 65  BILITOT 0.5  PROT 6.8  ALBUMIN 3.2*   CBC:  Recent Labs Lab 12/11/12 1208 12/16/12 1110  WBC 22.9* 31.5*  NEUTROABS 21.9* 30.6*  HGB 14.1 13.9  HCT 41.9 42.3  MCV 93.3 91.9  PLT 206 188.0   Cardiac Enzymes:  Recent Labs Lab 12/11/12 1208  TROPONINI <0.30   BNP: BNP (last 3 results)  Recent Labs  09/21/12 1452 12/11/12 1208 12/16/12 1110  PROBNP 484.0* 11166.0* 859.0*   Signed:  Pamella Pert  [facility] 12/16/2012, 12:46 PM

## 2012-12-16 NOTE — Assessment & Plan Note (Addendum)
Left sided CAP, now resolved on cxr  However rising WBC , ?steroid response however on chronic steroids  Case discussed with Dr. Kriste Basque   Will extend abx for 5 additional days beginning tomorrow with levaquin 500mg  daily x 5 days  Repeat CBC on return

## 2012-12-17 LAB — CULTURE, BLOOD (ROUTINE X 2): Culture: NO GROWTH

## 2012-12-19 ENCOUNTER — Telehealth: Payer: Self-pay | Admitting: Pulmonary Disease

## 2012-12-19 NOTE — Telephone Encounter (Signed)
Will sign off message 

## 2012-12-19 NOTE — Progress Notes (Signed)
Quick Note:  Called spoke with patient, advised of lab results / recs as stated by TP. Pt verbalized her understanding and denied any questions and will keep 12.17.14 ov w/ SN. ______

## 2012-12-19 NOTE — Telephone Encounter (Signed)
Needs ok to taker her oxygen saturation

## 2012-12-19 NOTE — Telephone Encounter (Signed)
Called and spoke with eric and he is aware ok for the verbal order to start PT at home with pt and to check sats per their protocol for any SOB.  Minerva Areola is aware of verbal order and nothing further is needed.

## 2012-12-22 ENCOUNTER — Telehealth: Payer: Self-pay | Admitting: Pulmonary Disease

## 2012-12-22 DIAGNOSIS — M810 Age-related osteoporosis without current pathological fracture: Secondary | ICD-10-CM

## 2012-12-22 NOTE — Telephone Encounter (Signed)
Called and spoke with pt's son and he is aware that we will get the order over to [facility] to set the pt up with OT along with her PT.  He stated that the pt is having a hard time getting in and out of the shower.  Nothing further is needed.

## 2012-12-27 ENCOUNTER — Telehealth: Payer: Self-pay | Admitting: Pulmonary Disease

## 2012-12-27 NOTE — Telephone Encounter (Signed)
Spoke to [place]. Gave verbal order for OT and home health aide. Asked her if the pt was having any other symptoms along with the low grade fever. Harriett Sine stated that the pt said she felt fine. Julaine Fusi to let the pt know to keep an eye on her temp and continue to take Advil as long she has a fever.

## 2012-12-27 NOTE — Telephone Encounter (Signed)
lmtcb x1 

## 2013-01-02 ENCOUNTER — Telehealth: Payer: Self-pay | Admitting: Pulmonary Disease

## 2013-01-02 DIAGNOSIS — S41109A Unspecified open wound of unspecified upper arm, initial encounter: Secondary | ICD-10-CM

## 2013-01-02 NOTE — Telephone Encounter (Signed)
Received call from Chantel whom had received a call stating that a message was left regarding this pt at the wrong number Called pt again, spoke with son Genevie Cheshire and advised of the below [place] verbalized his understanding and denied any questions Will sign off.

## 2013-01-02 NOTE — Telephone Encounter (Signed)
I called and spoke with pt son. He reports the spot where pt had IV in when in hospital bleeds at night while asleep. He wants an order for a nurse from [facility] to come out and take a look at this. Please advise SN thanks

## 2013-01-02 NOTE — Telephone Encounter (Signed)
lmtcb x1--referral sent

## 2013-01-02 NOTE — Telephone Encounter (Signed)
I spoke with Andrea Rubio. She reports they are wanting to go out and see pt for about 3 times a week for few weeks. Pt has a skin tear and will be taking care of this. I gave VO. Nothing further needed

## 2013-01-02 NOTE — Telephone Encounter (Signed)
Per SN---  Ok to have a nurse from [facility] come out and eval area on arm that bleeds at night.  thanks

## 2013-01-05 ENCOUNTER — Other Ambulatory Visit: Payer: Self-pay | Admitting: Pulmonary Disease

## 2013-01-13 ENCOUNTER — Other Ambulatory Visit: Payer: Self-pay | Admitting: Pulmonary Disease

## 2013-01-18 ENCOUNTER — Telehealth: Payer: Self-pay | Admitting: Pulmonary Disease

## 2013-01-18 ENCOUNTER — Ambulatory Visit: Payer: Medicare PPO | Admitting: Pulmonary Disease

## 2013-01-18 NOTE — Telephone Encounter (Signed)
I spoke with spouse. He scheduled pt to come in on Friday to be seen. He is aware if pt worsens she needs to go to ED or UC. I also did offere appt tomorrow but refused stating pt [facility] nurse comes out tomorrow. Nothing further needed

## 2013-01-20 ENCOUNTER — Ambulatory Visit: Payer: Medicare PPO | Admitting: Adult Health

## 2013-01-23 ENCOUNTER — Ambulatory Visit (INDEPENDENT_AMBULATORY_CARE_PROVIDER_SITE_OTHER): Payer: Medicare PPO | Admitting: Adult Health

## 2013-01-23 ENCOUNTER — Other Ambulatory Visit (INDEPENDENT_AMBULATORY_CARE_PROVIDER_SITE_OTHER): Payer: Medicare PPO

## 2013-01-23 ENCOUNTER — Encounter: Payer: Self-pay | Admitting: Adult Health

## 2013-01-23 VITALS — BP 106/68 | HR 84 | Temp 98.4°F | Wt 129.2 lb

## 2013-01-23 DIAGNOSIS — J189 Pneumonia, unspecified organism: Secondary | ICD-10-CM

## 2013-01-23 DIAGNOSIS — M810 Age-related osteoporosis without current pathological fracture: Secondary | ICD-10-CM

## 2013-01-23 DIAGNOSIS — D72829 Elevated white blood cell count, unspecified: Secondary | ICD-10-CM

## 2013-01-23 DIAGNOSIS — J449 Chronic obstructive pulmonary disease, unspecified: Secondary | ICD-10-CM

## 2013-01-23 LAB — CBC WITH DIFFERENTIAL/PLATELET
Basophils Absolute: 0 10*3/uL (ref 0.0–0.1)
Eosinophils Relative: 0.1 % (ref 0.0–5.0)
HCT: 42.2 % (ref 36.0–46.0)
Lymphocytes Relative: 6.1 % — ABNORMAL LOW (ref 12.0–46.0)
Lymphs Abs: 0.8 10*3/uL (ref 0.7–4.0)
MCV: 91.9 fl (ref 78.0–100.0)
Monocytes Relative: 2.2 % — ABNORMAL LOW (ref 3.0–12.0)
Neutrophils Relative %: 91.6 % — ABNORMAL HIGH (ref 43.0–77.0)
Platelets: 203 10*3/uL (ref 150.0–400.0)
RDW: 15.8 % — ABNORMAL HIGH (ref 11.5–14.6)
WBC: 12.3 10*3/uL — ABNORMAL HIGH (ref 4.5–10.5)

## 2013-01-23 NOTE — Patient Instructions (Addendum)
Decrease Prednisone 20mg  alternating 10mg  daily until seen back in office  Labs today  Follow up Dr. Kriste Basque  In 2 months and As needed   Please contact office for sooner follow up if symptoms do not improve or worsen or seek emergency care

## 2013-01-23 NOTE — Assessment & Plan Note (Signed)
Recent flare with PNA  Now resolving back to baseline   Plan  Cont on current regimen  Decrease Prednisone 20mg  alternating 10mg  daily until seen back in office  Labs today  Follow up Dr. Kriste Basque  In 2 months and As needed   Please contact office for sooner follow up if symptoms do not improve or worsen or seek emergency care

## 2013-01-23 NOTE — Addendum Note (Signed)
Addended by: Boone Master E on: 01/23/2013 04:55 PM   Modules accepted: Orders

## 2013-01-23 NOTE — Progress Notes (Signed)
Subjective:    Patient ID: Andrea [facility], female    DOB: Jun 09, 1925, 79 y.o.   MRN: [id number]  HPI 79 y/o WF here for a follow up visit... she has multiple medical problems as noted below...   ~  May 27, 2011:  27mo ROV & Andrea Rubio fell in her bathroom several weeks ago- hit the back of her head & left arm abrasion; EMS checked her & dressed her wound and she decided to see her local physician Andrea Rubio; he redressed the left arm abrasion & reassured her;  We rechecked her occipital area- sm scab ok to wash etc, and left arm abrasion healing nicely; She knows to be more careful to avoid falls etc... We reviewed her meds, problem list, & prev labs> see below>>    She tells me she saw [facility] for [facility] f/u about a month ago> stable, no changes made (we don't get notes from him)...  ~  October 07, 2011:  27mo ROV     She saw Urology 7/13 for Estring change> this has been controlling her dysuria & is done every 29mo...    She is followed by Andrea Rubio for Ortho> L1 compression fx ~4/13; on Tramadol & Miacalcin NS, Vit D, & Calcium... We reviewed prob list, meds, xrays and labs> see below>> She requests refill of Nizoral shampoo- ok... LABS 9/13:  FLP- not at goals on Simva40, reminded to take everyday;  Chems- wnl;  CBC- wnl  ~  February 10, 2012:  27mo ROV & Andrea Rubio has had a refractory AB episode w/ persist cough & discolored mucus; now on Avelox & we reviewed the need to maximize her Rx w/ Pred 5mg /d, NEBS Tid, Advair250Bid, Mucinex-2Bid, Fluids, etc... BP controlled on Lisin + Lasix;  Cards followed by Andrea Rubio;  She was started on Atorva20 but didn't bring her med bottles to review (as requested)...    We reviewed prob list, meds, xrays and labs> see below for updates >>   ~  March 14, 2012:  36mo ROV & Andrea Rubio has improved but not all the way resolved from her recent AB exac (see below); We reviewed the following medical problems during today's office visit >>     AB> she had AB exac last month &  Rx w/ Avelox, Pred, Nebs, Advair250, Mucinex, etc; she is improved but not back to baseline & we are forced to bump Pred to 20mg  tabs- 4d taper + NEBS Qid, max Mucinex, etc...    HBP> now on Cozaar25, Lasix40, K20; BP=122/82 & she denies CP, palpit, ch in SOB, edema, etc...    Cardiac> severeAS & CHF> followed by IONGEXBMW, meds reviewed, we don't have notes from him...    Hyperlipid> states she is off the Lip20; she did not bring med list or bottles to review; asked to have Cards send notes to me & f/u FLP off meds...    HH, GERD, Divertics> on Prilosec20 & Metaclop10qhs; sister died w/ colon ca- pts last colonoscopy 6/06 by Andrea Rubio w/o polyps (+divertics & ischemic colitis)...    DJD, Gout, LBP> on Uloric40 & Tramadol50 prn; she has severe deformities & difficulty getting up & about; she desperately needs more PT but she refuses...    Osteoporosis> on Miacalcin NS;     Anxiety> she takes [id number] but declines anxiolytic rx... We reviewed prob list, meds, xrays and labs> see below for updates >>  LABS 2/14:  Chems- wnl;  CBC- wnl;  BNP= 427...   ~  April 20, 2012:  75mo ROV & Andrea Rubio has stabilized by bumping up Pred, now weaned back down to 5mg /d plus her max regimen... She is managing as best she can w/ help of her son at home; notes some pain in her left hip- known osteoporosis & BMD 3/14 showed TScores +0.7 in Spine, and -2.3 in left Fem Neck, and -1.9 in Forearm radius; she's been on the Miacalcin NS "it seems to be helping" she says, therefore continue the same for now...  BP stable, denies CP, palpit, ch in SOB etc... She has severe AS followed by Andrea Rubio but apparently there is not much he can do...    We reviewed prob list, meds, xrays and labs> see below for updates >> we will recheck her in 2-3 months...  ~  May 24, 2012:  75mo ROV & in the interval Andrea Rubio had yet another exac characterized by incr cough, yellow sput, congestion, wheezing & SOB; we called in Levaquin, then had to bump the  Pred back up (first to 10mg /d, then to 20mg /d);  She reports that she is a lot better now on the Pred20- "I'm better than I've been the whole time"- and we discussed a SLOW wean to try to determine her threshold dose> for now decr to 20 alt w/ 10mg  Qod & f/u 75mo...  She will also continue on her Miacalcin NS, Calcium, MVI, VitD 1000u/d...    We reviewed prob list, meds, xrays and labs> see below for updates >>   ~  Jun 22, 2012:  75mo ROV & Andrea Rubio has been on Pred20mg - taking one tab alt w/ 1/2 tab Qod for the last month; pt states "I'm a lot better, just can't get over the bronchitis from last fall";  She has lost 3# & edema is diminished- BP= 124/80, Chest exam w/ exp rhonchi & she is reminded to take the Mucinex regularly;  I gave her & son the option of continuing the Pred 20-10 vs wean further to 10mg /d & they prefer the latter (told it was ok to take an extra 10mg  Pred on any day she noted incr trouble breathing)...     We reviewed prob list, meds, xrays and labs> see below for updates >>   ~  July 27, 2012:  75mo ROV & Andrea Rubio reports that she is "doing pretty good", has cut down her Pred to 10mg /d & rarely needs the extra 10mg  on any given day; she does not like the hot weather; her son wonders if Singulair would help her & we agreed to write a new Rx for this as a trial;  We also rec adding Flutter valve therapy to aide in mucocilliary production...  BP is stable at 118/68 and O2 sat= 96% on room air...  ~  September 21, 2012:  35mo ROV & Andrea Rubio has had an exac w/ incr cough, change in phlegm to thick dark sput, and sl more SOB but no f/c/s, no CP, etc;  She tried singulair (per son's request) but no better therefore rec to stop this add-on med;  Asked to use Flutter valve and incr efforts at improving mucociliary function & sputum clearance but she never did & doesn't think she can do it effectively (?really);  Exam shows stable VS, sats 96% on RA, chest w/ bibasilar rhonchi;  CXR shows chr changes w/o acute  dis & Labs showed elev WBC=20K w/ elev sed=43;  We decided to treat w/ Levaquin empirically, bump Pred 10=>20/d, and continue vigorous efforts at mucus clearance.Marland KitchenMarland Kitchen  AB, COPD> on Pred, Nebs, Advair250, Mucinex, etc; see above>     HBP> now on Cozaar25, Lasix40, K20; BP=118/68 & she denies CP, palpit, ch in SOB, edema, etc...    Cardiac> severeAS & CHF> on ASA + above; followed by ZOXWRUEAV- we don't have notes from him...    Hyperlipid> Lip20- still on her list; she did not bring med list or bottles to review; asked to have Cards send notes to me & f/u FLP when able...    HH, GERD, Divertics> on Prilosec20 & Metaclop10qhs; sister died w/ colon ca- pts last colonoscopy 6/06 by [facility] w/o polyps (+divertics & ischemic colitis)...    DJD, Gout, LBP> on Uloric40 & Tramadol50 prn; she has severe deformities (bilat shoulder arthroplasties) & difficulty getting up & about; she desperately needs more PT but she refuses...    Osteoporosis> on Miacalcin NS, calcium, MVI, VitD; BMD 3/14 showed TScore -2.3 in left [facility]; continue same...    Anxiety> she takes [id number] but declines anxiolytic rx... We reviewed prob list, meds, xrays and labs> see below for updates >>  LABS 8/14:  Chems- ok x BS=134;  CBC- ok x WBC=20K;  BNP=484;  Sed=43;  Sput= NTF only... CXR 8/14 showed cardiomeg, atherosclerosis of Ao, chr changes but clear lungs/ NAD, bilat shoulder arthroplasties, compressionL1...  ~  October 24, 2012:  73mo ROV & Andrea Rubio indicates that she improved on the Levaquin & w/ incr in Pred to 20mg /d after last OV but notes that her bronchitis symptoms returned w/ cough & brown sputum by her hx;  We decided to treat w/ Keflex 500mg Tid & try to decr the Pred to 20mg  alt w/ 10mg  Qod... Follow up 73mo recheck.    We reviewed prob list, meds, xrays and labs> see below for updates >>   ~  December 07, 2012:  73mo ROV & Andrea Rubio's son reports that he tried to decr the Pred to 20-10 Qod but she had incr difficulty  breathing & he bumped her up to 20mg /d again; she finished the Keflex, & is using the Nebulizer, Mucinex, & Chest PT/ postural drain as best she can; she notes increased congestion at nights & we discussed the roll of nocturnal reflux in this symptom- rec to elev HOB 6", incr Omep20 to Bid & continue the Reglan10mg Qhs;  She also notes some sharp CP under left breast- worse w/ cough & we reviewed Rx w/ rest, heat, cough syrup prn; Note> there are no other substantial interventions avail for treatment & we may have to discuss hospice care before too long... Once again asked to try to decr the Pred to 20-10Qod if able... Follow up visit in 6 wks.    12/16/12  Post Hospital follow up  Returns for a post hospital follow up .  Admitted 11/9 for CHF and PNA  2 D echo showed grade 2 diastolic dysfunction and critical AV stenosis. .  She has known AV disease  Previous  2DEcho 1/12 in [place] showed severeAS & mildAI, mild conc LVH, norm sys func w/ EF=60-65%, HK at apex, grI DD...followed by Dr. Algie Coffer .  Tx w/ aggressive diuresis. Started on IV abx and discharged on azithromycin. Last dose today.  Says she is still very weak from hospital  , still has some cough and dyspena.  Wants home PT set up .  Today CXR shows improvement and increased aeration on left lung consolidation.  She denies any fever, appetite is fair.  Son helps her with her ADLs. No n/v/d,  chest pain or orthopnea.  Today BNP trended down from ~11K to ~800  WBC is tr up from ~20k to 31K  Continues on Prednisone 20mg  daily  >>levaquin extended for 5 additonal days    01/23/2013 Follow up  Patient returns for a followup appointment. Patient was recently admitted to the hospital last month for pneumonia, and congestive heart failure decompensation. She was seen in office for weeks ago. That time. Chest x-ray had improved substantially however, her white blood count had increased from 20,000-31,000. Patient was extended on Levaquin for an  additional 5 days. Patient returns today reporting that she is improved. She has decreased cough, shortness of breath. Patient states that her energy level has returned back to her baseline. Her appetite is improved greatly. Patient denies any hemoptysis, discolored mucus, fever, orthopnea, PND, or leg swelling.        Problem List:    MACULAR DEGENERATION (ICD-362.50) - she is legally blind & son helps out at home...  SINUSITIS, CHRONIC (ICD-473.9) - she is s/p bliat [facility] 1986 by DrKraus... ~  CT Sinus 11/09 showed chr sinusitis changes in the max & sphenoid regions... ~  8/12: she is c/o drippy nose, trial Atrovent nasal spray...  ASTHMATIC BRONCHITIS, ACUTE (ICD-466.0) - on PRED 5mg /d, ADVAIR 250Bid, & ALBUTEROL NEBS Tid... stable- min cough, w/o sputum, no change in DOE, etc... ~  PFT's 3/06 showed FVC= 1.49 (68%), FEV1= 1.12 (75%), %1sec=75, mid-flows= 43%pred... ~  [facility] 8/09 w/ bilat pneumonia... see DC Summary. ~  CXR 10/09 showed stable cardiomeg, tort Ao, clear lungs, bilat shoulder arthroplasties... ~  CT Angio Chest 11/09 showed neg for PE, fluid/ mucus plugs in depend LL bronchi... ~  CXR 10/10 showed stable cardiomeg, bilat humeral head prostheses, NAD.Marland Kitchen. ~  CXR 1/12 showed COPD, chr changes, ?right basilar opacity ==> subsequent films w/ incr edema. ~  CXR 5/12 showed cardiomeg, sm right effusion, no edema, bilat shoulder arthroplasties, NAD... ~  10/12:  AB exac treated by TP w/ Augmentin & improved... ~  1/14:  AB exac treated w/ Avelox, Pred taper, NEBS qid, Advair250, Mucinex, etc... ~  CXR 1/14 showed borderline heart size, sm bilat effusions and basilar atx...  ~  3/14: back on Pred 5mg /d and stable once again=> then had yet another exac requiring Rx w/ Levaquin, incr Pred to 20mg /d... ~  4/14:  Much improved on Pred20/d & we decided to decr to 20-10 Qod & recheck 103mo... ~  5/14:  She appears stable & wants to attempt further Pred taper to 10mg /d w/ ROV 86mo=>  stable continue the same... ~  8/14:  COPD exac treated w/ Levaquin, bump Pred to 20, continue max Rx=> CXR w/ chr changes, Sput=> NTF; pt reports improved on rx... ~  9/14:  She reports initially better then w/ recurrent cough, dark sputum, no CP/ ch in SOB, f/c/s; we decided to treat again w/ Keflex 500mg  Tid & try to wean the Pred 20-10 Qod.  HYPERTENSION (ICD-401.9) >>  ~  8/12: her BP's have stabilized at home and measures 112/68 here today... she denies CP, palpit, syncope, ch in edema... ~  12/12:on Lisin5/d, Lasix40/d & K20/d; BP= 126/72 & clinically stable w/o CP, palpit, ch in SOB, etc... ~  4/13: on Lisin5/d, Lasix40/d & K20/d; BP= 108/62 & usually sl better at home; still denies any CP, palpit, ch in SOB, etc... ~  2/14: now on Cozaar25, Lasix40, K20; BP=122/82 & she denies CP, palpit, ch in SOB, edema, etc ~  5/14: on Cozaar25, Lasix40, K20; BP=124/80 & she denies CP, palpit, ch in SOB, edema, etc... ~  8/14: on Cozaar25, Lasix40, K20; BP=118/68 & she denies CP, palpit, ch in SOB, edema, etc... ~  9/14: on same meds- BP=110/72  AORTIC STENOSIS (ICD-424.1)   << followed by [id number] for Cards >> Hx of CHF (ICD-428.0)  ~  2DEcho 3/06 showed mild AS/AI w/ mod incr AoV thickness & reduced leaflet excursion, mild MR, norm EF=55-65%...  ~  2DEcho 12/08 showed mod inc AoV thickness, mod reduced AV leaflet excursion, c/w mod AS, mild AI & mean grad= ;  norm LVF w/ EF= 65% and no wall motion abn etc... ~  2DEcho 1/12 in [place] showed severeAS & mildAI, mild conc LVH, norm sys func w/ EF=60-65%, HK at apex, grI DD... ~  EKG 1/12 showed NSR, poor R progression, NSSTTWA... ~  We do not have follow up notes from [facility], pt indicates no change in meds...  PERIPHERAL VASCULAR DISEASE (ICD-443.9) - on ASA 81mg /d... she can't walk enough to elicit discomfort, no rest pain or lesions... ~  ABI's 12/01 showed .88 on R, and .95 on L... ~  MRI showed small vessel dis & small infarct  noted...  VENOUS INSUFFICIENCY (ICD-459.81) - on low sodium diet, Lasix, KCl...  HYPERLIPIDEMIA (ICD-272.4) - prev on Zocor 40mg  per [id number]- off now;  (prev on Pravachol, but stated intol to other Statins)... takes Fish Oil ~2000mg /d rec by her ophthalmologist... ~  FLP 9/05 ?on Prav40 showed TChol 176, TG 190, HDL 55, LDL 83... ~  FLP 4/10 on diet alone showed TChol 211, TG 153, HDL 44, LDL 128... she refuses med Rx- continue diet efforts. ~  1/12:  Hosp by [facility] & consulted DrKadakia who started her on ZOCOR40mg /d> he is checking her labs now... ~  12/12: she crossed Simva40 off her list ?stopped on her own or ?stopped by [place]... ~  2013:  She still has Simva40 on her list here but NEVER brings a list from [place] or her med bottles to review... ~  9/13:  FLP here showed TChol 206, TG 167, HDL 48, LDL 133... She has agreed to try ATORVASTATIN 20mg /d... ~  2/14: she reports that she is off all statin meds, on diet alone... ~  8/14: med list includes Lip20- she did not bring meds to office, we have never seen notes from [place]...  HIATAL HERNIA (ICD-553.3) - on PRILOSEC 20mg /d & METACLOPRAMIDE 10mg Qhs... GERD (ICD-530.81) - EGD 3/06 by DrStark showed 3cmHH, mild esophagitis...  DIVERTICULOSIS, COLON (ICD-562.10) - last colonoscopy was 6/06 by [facility] showed divertics and ischemic colitis...  ~  Adm 1/12 by [facility] w/ Diverticulitis, treated & improved, she has not followed up w/ GI; she take 1/2 capful of MIRALAX daily.  STRESS INCONTINENCE (ICD-788.39) - she has hx UTI's & dysuria on & off... ~  10/10: urine c/s +EColi & Rx'd w/ Cipro. ~  7/12: she reports vaginal estrogen per Urology has helped her urinary symptoms; note from DrOttelin reviewed. ~  Labs 12/12 w/ normal renal function & Creat=0.7 ~  10/13: she had f/u DrOttelin> Hx trophic vaginitis, dysuria, & recurrent UTIs; rec topical estrogen=> estring (he replaced it that day); PVR=100; he rec ROV in 83mo for estring replacement. ~  2/14  & 5/14: f/u post menopausal atrophic vaginitis, chr cystitis, on Estring but cannot replace it herself- seen Q45mo for this purpose... ~  8/14: she saw Urology for belated estring change, hx atrophic vaginitis, chr cystitis & dysuria.Marland KitchenMarland Kitchen  DEGENERATIVE JOINT DISEASE (ICD-715.90) - she has severe DJD & Gout w/ deformities- Foot XRay showed Charcot foot deformity, severe degenerative dis, etc...  added TRAMADOL + TYLENOL Prn... GOUT, UNSPECIFIED (ICD-274.9) - labs 4/10 showed Uric Acid level= 7.9.Marland KitchenMarland Kitchen she has hx of gout & hyperuricemia but allergic to Allopurinol w/ rash... therefore on ULORIC 40mg /d... Uric Acid level 5/11 on Uloric40 = 3.2 LOW BACK PAIN (ICD-724.2) NECK PAIN (ICD-723.1) - eval by Andrea Rubio ==> DrLewitt 2010 w/ MRI & Rx  w/ Lidoderm, Tylenol, Lyrica- now on NEURONTIN... OSTEOPOROSIS (ICD-733.00) - prev on Fosamax (stopped 1/12 in hosp "my kidneys can't handle it"); on Ca++, Vits, & Vit D... ~  labs 4/10 showed Vit D level = 28... rec> start Vit D OTC 1000 u daily. ~  labs 5/11 showed Vit D level = 32... rec incr Vit D to 2000 u daily. ~  2013:  She fell w/ L1 compression & managed by Andrea Rubio w/ MIACALCIN NS & Tramadol... ~  3/14:  BMD here showed TScores +0.7 in Spine, and -2.3 in left Fem Neck, and -1.9 in Forearm radius; she feels the Miacalcin is helping, therefore continue same...  ANXIETY (ICD-300.00) - off prev Alpraz Rx & takes AMITRIPTYLINE 25mg  Qhs... under stress w/ 66 y/o son had MI & stent in 2010.  Hx of ANEMIA (ICD-285.9) - hx anemia req 2U transfusion 6/06 hosp for ischemic colitis...  ~  Hg= 12 - 13 over 2008-9. ~  labs 8/09 hosp showed Hg= 14 ~  labs 4/10 showed Hg= 12.2 ~  labs 3/11 showed Hg= 12.5, MCV= 92, Fe= 22 (9%sat)... start FeSO4 Bid. ~  labs 5/11 showed = 13.0, MCV= 94, Fe= 57 (23%)... continue Fe supplement. ~  Labs 12/12 showed Hg= 13.1, Fe= 35, (14%sat)... rec to continue Fe+VitC Bid... ~  Labs 9/13 showed Hg= 13.7, MCV=94 ~  Labs 8/14 showed Hg= 14.3,  MCV= 91  DERM>  She has skin cancer removed from the right side of her face by DrDanJones (Spring2012)...   Past Surgical History  Procedure Laterality Date  . Lumbar laminectomy    . Shoulder surgery      bilateral    Outpatient Encounter Prescriptions as of 01/23/2013  Medication Sig  . albuterol (PROVENTIL) (2.5 MG/3ML) 0.083% nebulizer solution Take 3 mLs (2.5 mg total) by nebulization 4 (four) times daily.  Marland Kitchen amitriptyline (ELAVIL) 25 MG tablet Take 25 mg by mouth at bedtime.  Marland Kitchen aspirin 81 MG tablet Take 81 mg by mouth daily.    Marland Kitchen atorvastatin (LIPITOR) 20 MG tablet Take 20 mg by mouth daily.  . Azelastine-Fluticasone 137-50 MCG/ACT SUSP Place 1-2 sprays into the nose 2 (two) times daily as needed.  . calcitonin, salmon, (MIACALCIN/FORTICAL) 200 UNIT/ACT nasal spray Place 1 spray into the nose daily.  . Calcium Carbonate-Vitamin D (CALCIUM 600+D) 600-400 MG-UNIT per tablet Take 1 tablet by mouth daily.   . Cholecalciferol (VITAMIN D) 1000 UNITS capsule Take 1,000 Units by mouth daily.    . clotrimazole-betamethasone (LOTRISONE) cream Apply 1 application topically 2 (two) times daily.  Marland Kitchen conjugated estrogens (PREMARIN) vaginal cream daily.   . febuxostat (ULORIC) 40 MG tablet Take 40 mg by mouth daily.  . ferrous gluconate (FERGON) 325 MG tablet Take 325 mg by mouth daily with breakfast.   . Fluticasone-Salmeterol (ADVAIR DISKUS) 250-50 MCG/DOSE AEPB Inhale 1 puff into the lungs 2 (two) times daily.  . furosemide (LASIX) 40 MG tablet TAKE 1 TABLET ONCE DAILY.  Marland Kitchen Guaifenesin 1200 MG TB12 Take 1  tablet by mouth 2 (two) times daily.  Marland Kitchen ipratropium (ATROVENT) 0.06 % nasal spray Place 2 sprays into both nostrils 4 (four) times daily.  Marland Kitchen ipratropium-albuterol (DUONEB) 0.5-2.5 (3) MG/3ML SOLN Take 3 mLs by nebulization 4 (four) times daily as needed.  Marland Kitchen ketoconazole (NIZORAL) 2 % shampoo Use as directed  . losartan (COZAAR) 25 MG tablet Take 25 mg by mouth daily.  . metoCLOPramide  (REGLAN) 10 MG tablet TAKE ONE TABLET AT BEDTIME.  . montelukast (SINGULAIR) 10 MG tablet Take 1 tablet by mouth daily.  . Multiple Vitamin (MULTIVITAMIN) capsule Take 1 capsule by mouth daily.    Marland Kitchen omeprazole (PRILOSEC) 20 MG capsule Take 20 mg by mouth daily.  . potassium chloride SA (K-DUR,KLOR-CON) 20 MEQ tablet Take 1 tablet by mouth daily.  . predniSONE (DELTASONE) 20 MG tablet Take prednisone as directed  . Respiratory Therapy Supplies (FLUTTER) DEVI Use as directed  . traMADol (ULTRAM) 50 MG tablet Take 50 mg by mouth every 6 (six) hours as needed.  . predniSONE (DELTASONE) 20 MG tablet Take 10-20 mg by mouth daily with breakfast. Alternates between 10mg  and 20 mg every other day 20-10-20-10    Allergies  Allergen Reactions  . Allopurinol     REACTION: rash  . Codeine   . Hydrocodone     REACTION: hallucinations  . Morphine     REACTION: hallucinations    Current Medications, Allergies, Past Medical History, Past Surgical History, Family History, and Social History were reviewed in [employer] record.    Review of Systems        See HPI - all other systems neg except as noted... The patient complains of dyspnea on exertion, .  The patient denies anorexia, fever, weight loss, weight gain, decreased hearing, hoarseness, chest pain, syncope,  headaches, hemoptysis, melena, hematochezia, hematuria, incontinence, suspicious skin lesions, depression, unusual weight change, abnormal bleeding, enlarged lymph nodes, and angioedema.     Objective:   Physical Exam    WD, Petite, sl overweight, 79 y/o WF in NAD.in wheelchair GENERAL:  Alert & oriented; pleasant & cooperative... HEENT:  [place]/AT, EACs-clear, TMs-wnl, NOSE-clear, THROAT-clear & wnl... known macular degen per ophthal... NECK:  Supple w/ decrROM; no JVD; normal carotid impulses w/o bruits; no thyromegaly or nodules palpated; no lymphadenopathy. CHEST:  Decr BS bilat, Coarse BS, no wheezing or  rhonchi... HEART:  Regular Rhythm;  gr 2/6 SEM without rubs or gallops detected... ABDOMEN:  Soft & nontender; normal bowel sounds; no organomegaly or masses palpated... EXT: +hand deformities, w/ tophi & mod arthritic changes & contractures... right knee arthritis & severe foot deformities as well. no varicose veins/+venous insuffic/ tr edema. NEURO:  CN's intact x reduced vision... diffusely weak, without focal changes... +gait abn... DERM:  A +ecchymoses, thin skin, etc...   BNP    Component Value Date/Time   PROBNP 859.0* 12/16/2012 1110     Assessment & Plan:

## 2013-01-23 NOTE — Assessment & Plan Note (Signed)
Leukocytosis probable associated with recent PNA and chronic steroid use  Recheck cbc today   Plan  Decrease Prednisone 20mg  alternating 10mg  daily until seen back in office  Labs today  Follow up Dr. Kriste Basque  In 1 months and As needed   Please contact office for sooner follow up if symptoms do not improve or worsen or seek emergency care

## 2013-01-24 ENCOUNTER — Telehealth: Payer: Self-pay | Admitting: Adult Health

## 2013-01-24 NOTE — Telephone Encounter (Signed)
lmomtcb x1 for tom

## 2013-01-24 NOTE — Telephone Encounter (Signed)
i spoke with Andrea Rubio. Nothing further needed. This has been taking care of

## 2013-01-24 NOTE — Telephone Encounter (Signed)
Patient's daughter calling asking about lift chair.  [phone]

## 2013-01-24 NOTE — Telephone Encounter (Signed)
lmomtcb x1 for pt 

## 2013-01-24 NOTE — Telephone Encounter (Signed)
Returning call.

## 2013-02-03 ENCOUNTER — Telehealth: Payer: Self-pay | Admitting: Pulmonary Disease

## 2013-02-03 NOTE — Telephone Encounter (Signed)
Called and spoke with erik from [facility] and he took VO to extend the PT x 2 weeks.  Nothing further is needed.

## 2013-02-13 ENCOUNTER — Ambulatory Visit: Payer: Medicare PPO | Admitting: Pulmonary Disease

## 2013-02-16 ENCOUNTER — Other Ambulatory Visit: Payer: Self-pay

## 2013-02-16 MED ORDER — FUROSEMIDE 40 MG PO TABS: 40.0000 mg | ORAL_TABLET | Freq: Every day | ORAL | Status: DC

## 2013-02-16 NOTE — Telephone Encounter (Signed)
DLV with TP- 12.22.14//DLV with SN- 11.5.14//ROV with SN- 3.4.15 Lasix refilled.  Nothing further needed.

## 2013-02-23 ENCOUNTER — Other Ambulatory Visit: Payer: Self-pay | Admitting: Adult Health

## 2013-03-01 ENCOUNTER — Telehealth: Payer: Self-pay | Admitting: Pulmonary Disease

## 2013-03-01 NOTE — Telephone Encounter (Signed)
Called and spoke with pts son and he stated that he has still not heard anything from [facility] about the lift chair.    i called and spoke with [facility] and they did find the order but this has not been followed up on.  They will do some research and speak with the therapist that was following the pt to see about the lift chair.  [facility] to call back with info.

## 2013-03-01 NOTE — Telephone Encounter (Signed)
Andrea Rubio called back from [employer] and she stated that the daughter had called and spoke with them and stated that the pt has the liftchair in the home.  i called the pt back and she stated that she does not have the lift chair in the home.  She has an old chair--but not a lift chair---and she stated that her daughter was going to purchase this for her.  i advised the pt to have her daughter call [facility] to see what needs to be done about the liftchair.  Pt is aware that her insurance will only cover $600 for the motor for the lift chair and not the chair itself.  Gigi Gineggy will have her daughter call [facility] and will call back for any further recs.

## 2013-03-05 ENCOUNTER — Other Ambulatory Visit: Payer: Self-pay | Admitting: Pulmonary Disease

## 2013-03-13 ENCOUNTER — Telehealth: Payer: Self-pay | Admitting: Pulmonary Disease

## 2013-03-13 NOTE — Telephone Encounter (Signed)
Spoke with pt's son. He will talk with his family and get back with us about where they want the order sent for the pt's chair.

## 2013-03-13 NOTE — Telephone Encounter (Signed)
Per SN---  Ok to send in for a lift chair. The family will need to decide where they want to get this from.  They also need to be aware that the insurance will only cover up to 650$ for the motor for the chair and not the chair itself.    The last time the family called about the chair, we were told by the family that this was taken care of.

## 2013-03-13 NOTE — Telephone Encounter (Signed)
Spoke with pt's son. He was checking on the status of the order for the pt's lift chair. There is a telephone note from 03/01/13 about the same thing. I do not see where the chair was ordered.  SN - please advise on order for lift chair. Thanks.

## 2013-03-15 ENCOUNTER — Other Ambulatory Visit: Payer: Self-pay | Admitting: Pulmonary Disease

## 2013-03-15 ENCOUNTER — Telehealth: Payer: Self-pay | Admitting: Pulmonary Disease

## 2013-03-15 MED ORDER — SERTRALINE HCL 50 MG PO TABS: 50.0000 mg | ORAL_TABLET | Freq: Every day | ORAL | Status: AC

## 2013-03-15 NOTE — Telephone Encounter (Signed)
Received a fax from the pharmacist at [facility] and it stated that the pt is on amitriptyline 25 mg  1 po qhs and this medication has been placed on a list of high risk medications for the elderly.  Use of this medication has been associated with an increased risk of anticholingergic side effects, such as sedation, congnitive impairment, weakness, urinary retention, as well as questionable efficacy at lower doses.    Called and spoke with pt and she is aware of recs---pt stated that she will have her son pick this up from the pharmacy.  Pt is aware to stop the amitriptyline and start on the sertraline.  Nothing further is needed.

## 2013-03-15 NOTE — Telephone Encounter (Signed)
Per 2.10.15 phone note: Caryl AdaLindsay N Carroll, CMA at 03/13/2013 4:36 PM    Status: Signed       Spoke with pt's son.  He will talk with his family and get back with us about where they want the order sent for the pt's chair.       Marcellus Scottonnie L Adkins, CMA at 03/13/2013 3:53 PM     Status: Signed        Per SN---  Ok to send in for a lift chair. The family will need to decide where they want to get this from. They also need to be aware that the insurance will only cover up to 650$ for the motor for the chair and not the chair itself. The last time the family called about the chair, we were told by the family that this was taken care of.        Called spoke with pt's son Genevie CheshireBilly to see if they have decided what company pt would like her lift chair sent to.  Genevie CheshireBilly stated that pt thought we were calling regarding one of her medications - advised Genevie CheshireBilly that I see no documentation regarding any medications, no recent labs or radiology reports.  Genevie CheshireBilly okay with this; stated he will speak with his sister and call the office back once a decision has been made.    Will sign off to await pt's/son's return call.

## 2013-03-15 NOTE — Telephone Encounter (Signed)
Pt calling back.Andrea Rubio ° °

## 2013-03-25 ENCOUNTER — Other Ambulatory Visit: Payer: Self-pay | Admitting: Pulmonary Disease

## 2013-04-04 ENCOUNTER — Other Ambulatory Visit: Payer: Self-pay | Admitting: Pulmonary Disease

## 2013-04-05 ENCOUNTER — Ambulatory Visit (INDEPENDENT_AMBULATORY_CARE_PROVIDER_SITE_OTHER): Payer: Medicare PPO | Admitting: Pulmonary Disease

## 2013-04-05 ENCOUNTER — Encounter: Payer: Self-pay | Admitting: Pulmonary Disease

## 2013-04-05 VITALS — BP 118/64 | HR 91 | Temp 97.0°F | Ht 60.0 in | Wt 127.8 lb

## 2013-04-05 DIAGNOSIS — M545 Low back pain, unspecified: Secondary | ICD-10-CM

## 2013-04-05 DIAGNOSIS — M1990 Unspecified osteoarthritis, unspecified site: Secondary | ICD-10-CM

## 2013-04-05 DIAGNOSIS — I359 Nonrheumatic aortic valve disorder, unspecified: Secondary | ICD-10-CM

## 2013-04-05 DIAGNOSIS — M810 Age-related osteoporosis without current pathological fracture: Secondary | ICD-10-CM

## 2013-04-05 DIAGNOSIS — K219 Gastro-esophageal reflux disease without esophagitis: Secondary | ICD-10-CM

## 2013-04-05 DIAGNOSIS — K5730 Diverticulosis of large intestine without perforation or abscess without bleeding: Secondary | ICD-10-CM

## 2013-04-05 DIAGNOSIS — J449 Chronic obstructive pulmonary disease, unspecified: Secondary | ICD-10-CM

## 2013-04-05 DIAGNOSIS — K449 Diaphragmatic hernia without obstruction or gangrene: Secondary | ICD-10-CM

## 2013-04-05 DIAGNOSIS — I10 Essential (primary) hypertension: Secondary | ICD-10-CM

## 2013-04-05 DIAGNOSIS — I5032 Chronic diastolic (congestive) heart failure: Secondary | ICD-10-CM | POA: Insufficient documentation

## 2013-04-05 DIAGNOSIS — I509 Heart failure, unspecified: Secondary | ICD-10-CM

## 2013-04-05 DIAGNOSIS — M109 Gout, unspecified: Secondary | ICD-10-CM

## 2013-04-05 DIAGNOSIS — L853 Xerosis cutis: Secondary | ICD-10-CM

## 2013-04-05 DIAGNOSIS — I872 Venous insufficiency (chronic) (peripheral): Secondary | ICD-10-CM

## 2013-04-05 DIAGNOSIS — F411 Generalized anxiety disorder: Secondary | ICD-10-CM

## 2013-04-05 DIAGNOSIS — H3530 Unspecified macular degeneration: Secondary | ICD-10-CM

## 2013-04-05 DIAGNOSIS — I739 Peripheral vascular disease, unspecified: Secondary | ICD-10-CM

## 2013-04-05 MED ORDER — POTASSIUM CHLORIDE CRYS ER 20 MEQ PO TBCR: EXTENDED_RELEASE_TABLET | ORAL | Status: DC

## 2013-04-05 NOTE — Progress Notes (Signed)
Subjective:    Patient ID: Andrea Rubio, female    DOB: Jun 06, 1925, 80 y.o.   MRN: [id number]  HPI 80 y/o WF here for a follow up visit... she has multiple medical problems as noted below...   ~  May 27, 2011:  19mo ROV & Andrea Rubio fell in her bathroom several weeks ago- hit the back of her head & left arm abrasion; EMS checked her & dressed her wound and she decided to see her local physician DrWElkins; he redressed the left arm abrasion & reassured her;  We rechecked her occipital area- sm scab ok to wash etc, and left arm abrasion healing nicely; She knows to be more careful to avoid falls etc... We reviewed her meds, problem list, & prev labs> see below>>    She tells me she saw Andrea Rubio for Cards f/u about a month ago> stable, no changes made (we don't get notes from him)...  ~  October 07, 2011:  19mo ROV     She saw Urology 7/13 for Estring change> this has been controlling her dysuria & is done every 264mo...    She is followed by Andrea Rubio for Ortho> L1 compression fx ~4/13; on Tramadol & Miacalcin NS, Vit D, & Calcium... We reviewed prob list, meds, xrays and labs> see below>> She requests refill of Nizoral shampoo- ok... LABS 9/13:  FLP- not at goals on Simva40, reminded to take everyday;  Chems- wnl;  CBC- wnl  ~  February 10, 2012:  19mo ROV & Andrea Rubio has had a refractory AB episode w/ persist cough & discolored mucus; now on Avelox & we reviewed the need to maximize her Rx w/ Pred 5mg /d, NEBS Tid, Advair250Bid, Mucinex-2Bid, Fluids, etc... BP controlled on Lisin + Lasix;  Cards followed by Andrea Rubio;  She was started on Atorva20 but didn't bring her med bottles to review (as requested)...    We reviewed prob list, meds, xrays and labs> see below for updates >>   ~  March 14, 2012:  64mo ROV & Andrea Rubio has improved but not all the way resolved from her recent AB exac (see below); We reviewed the following medical problems during today's office visit >>     AB> she had AB exac last month &  Rx w/ Avelox, Pred, Nebs, Advair250, Mucinex, etc; she is improved but not back to baseline & we are forced to bump Pred to 20mg  tabs- 4d taper + NEBS Qid, max Mucinex, etc...    HBP> now on Cozaar25, Lasix40, K20; BP=122/82 & she denies CP, palpit, ch in SOB, edema, etc...    Cardiac> severeAS & CHF> followed by Andrea Rubio, meds reviewed, we don't have notes from him...    Hyperlipid> states she is off the Lip20; she did not bring med list or bottles to review; asked to have Cards send notes to me & f/u FLP off meds...    HH, GERD, Divertics> on Prilosec20 & Metaclop10qhs; sister died w/ colon ca- pts last colonoscopy 6/06 by Andrea Rubio w/o polyps (+divertics & ischemic colitis)...    DJD, Gout, LBP> on Uloric40 & Tramadol50 prn; she has severe deformities & difficulty getting up & about; she desperately needs more PT but she refuses...    Osteoporosis> on Miacalcin NS;     Anxiety> she takes Andrea Rubio but declines anxiolytic rx... We reviewed prob list, meds, xrays and labs> see below for updates >>  LABS 2/14:  Chems- wnl;  CBC- wnl;  BNP= 427...   ~  April 20, 2012:  75mo ROV & Andrea Rubio has stabilized by bumping up Pred, now weaned back down to 5mg /d plus her max regimen... She is managing as best she can w/ help of her son at home; notes some pain in her left hip- known osteoporosis & BMD 3/14 showed TScores +0.7 in Spine, and -2.3 in left Fem Neck, and -1.9 in Forearm radius; she's been on the Miacalcin NS "it seems to be helping" she says, therefore continue the same for now...  BP stable, denies CP, palpit, ch in SOB etc... She has severe AS followed by Andrea Rubio but apparently there is not much he can do...    We reviewed prob list, meds, xrays and labs> see below for updates >> we will recheck her in 2-3 months...  ~  May 24, 2012:  75mo ROV & in the interval Andrea Rubio had yet another exac characterized by incr cough, yellow sput, congestion, wheezing & SOB; we called in Levaquin, then had to bump the  Pred back up (first to 10mg /d, then to 20mg /d);  She reports that she is a lot better now on the Pred20- "I'm better than I've been the whole time"- and we discussed a SLOW wean to try to determine her threshold dose> for now decr to 20 alt w/ 10mg  Qod & f/u 75mo...  She will also continue on her Miacalcin NS, Calcium, MVI, VitD 1000u/d...    We reviewed prob list, meds, xrays and labs> see below for updates >>   ~  Jun 22, 2012:  75mo ROV & Andrea Rubio has been on Pred20mg - taking one tab alt w/ 1/2 tab Qod for the last month; pt states "I'm a lot better, just can't get over the bronchitis from last fall";  She has lost 3# & edema is diminished- BP= 124/80, Chest exam w/ exp rhonchi & she is reminded to take the Mucinex regularly;  I gave her & son the option of continuing the Pred 20-10 vs wean further to 10mg /d & they prefer the latter (told it was ok to take an extra 10mg  Pred on any day she noted incr trouble breathing)...     We reviewed prob list, meds, xrays and labs> see below for updates >>   ~  July 27, 2012:  75mo ROV & Andrea Rubio reports that she is "doing pretty good", has cut down her Pred to 10mg /d & rarely needs the extra 10mg  on any given day; she does not like the hot weather; her son wonders if Singulair would help her & we agreed to write a new Rx for this as a trial;  We also rec adding Flutter valve therapy to aide in mucocilliary production...  BP is stable at 118/68 and O2 sat= 96% on room air...  ~  September 21, 2012:  35mo ROV & Andrea Rubio has had an exac w/ incr cough, change in phlegm to thick dark sput, and sl more SOB but no f/c/s, no CP, etc;  She tried singulair (per son's request) but no better therefore rec to stop this add-on med;  Asked to use Flutter valve and incr efforts at improving mucociliary function & sputum clearance but she never did & doesn't think she can do it effectively (?really);  Exam shows stable VS, sats 96% on RA, chest w/ bibasilar rhonchi;  CXR shows chr changes w/o acute  dis & Labs showed elev WBC=20K w/ elev sed=43;  We decided to treat w/ Levaquin empirically, bump Pred 10=>20/d, and continue vigorous efforts at mucus clearance.Marland KitchenMarland Kitchen  AB, COPD> on Pred, Nebs, Advair250, Mucinex, etc; see above>     HBP> now on Cozaar25, Lasix40, K20; BP=118/68 & she denies CP, palpit, ch in SOB, edema, etc...    Cardiac> severeAS & CHF> on ASA + above; followed by ZOXWRUEAVrKadakia- we don't have notes from him...    Hyperlipid> Lip20- still on her list; she did not bring med list or bottles to review; asked to have Cards send notes to me & f/u FLP when able...    HH, GERD, Divertics> on Prilosec20 & Metaclop10qhs; sister died w/ colon ca- [facility] (+divertics & ischemic colitis)...    DJD, Gout, LBP> on Uloric40 & Tramadol50 prn; she has severe deformities (bilat shoulder arthroplasties) & difficulty getting up & about; she desperately needs more PT but she refuses...    Osteoporosis> on Miacalcin NS, calcium, MVI, VitD; BMD 3/14 showed TScore -2.3 [facility]; continue same...    Anxiety> she takes [id number] but declines anxiolytic rx... We reviewed prob list, meds, xrays and labs> see below for updates >>  LABS 8/14:  Chems- ok x BS=134;  CBC- ok x WBC=20K;  BNP=484;  Sed=43;  Sput= NTF only... CXR 8/14 showed cardiomeg, atherosclerosis of Ao, chr changes but clear lungs/ NAD, bilat shoulder arthroplasties, compressionL1...  ~  October 24, 2012:  40mo ROV & Andrea Rubio indicates that she improved on the Levaquin & w/ incr in Pred to 20mg /d after last OV but notes that her bronchitis symptoms returned w/ cough & brown sputum by her hx;  We decided to treat w/ Keflex 500mg Tid & try to decr the Pred to 20mg  alt w/ 10mg  Qod... Follow up 40mo recheck.    We reviewed prob list, meds, xrays and labs> see below for updates >>   ~  December 07, 2012:  40mo ROV & Andrea Rubio's son reports that he tried to decr the Pred to 20-10 Qod but she had incr difficulty  breathing & he bumped her up to 20mg /d again; she finished the Keflex, & is using the Nebulizer, Mucinex, & Chest PT/ postural drain as best she can; she notes increased congestion at nights & we discussed the roll of nocturnal reflux in this symptom- rec to elev HOB 6", incr Omep20 to Bid & continue the Reglan10mg Qhs;  She also notes some sharp CP under left breast- worse w/ cough & we reviewed Rx w/ rest, heat, cough syrup prn; Note> there are no other substantial interventions avail for treatment & we may have to discuss hospice care before too long... Once again asked to try to decr the Pred to 20-10Qod if able... Follow up visit in 6 wks.  ~  April 05, 2013:  78mo ROV & Andrea Rubio appears stable- she has some pruritis and skin is dry & peeling some, she has severe DJD w/ deformity of hands; she was last hosp 11/14 w/ incr SOB, tired and weak, incr cough w/ dark sput, etc; CXR did not show any acute changes; she has known severe AS & not felt to be a surg candidate followed by Andrea Rubio; she was diuresed several liters and improved, f/u 2DEcho showed critical AS; also treated w/ Zithromax and Pred and improved to her baseline...  She had f/u visit w/ TP still weak but improved & she was covered w/ Levaquin orally; she sees Andrea Rubio every month but we do not have notes from him, she denies recent med changes... Prognosis is poor based on her severe AS.  We reviewed prob list, meds, xrays and labs> see below for updates >>  We discussed no salt, e3lev legs, wear support hose, & continue Lasix40mg /d; we decided to decr the Pred to 10mg  daily...           Problem List:    MACULAR DEGENERATION (ICD-362.50) - she is legally blind & son helps out at home...  SINUSITIS, CHRONIC (ICD-473.9) - she is s/p bliat [facility] 1986 by DrKraus... ~  CT Sinus 11/09 showed chr sinusitis changes in the max & sphenoid regions... ~  8/12: she is c/o drippy nose, trial Atrovent nasal spray...  ASTHMATIC BRONCHITIS,  ACUTE (ICD-466.0) - on PRED 5mg /d, ADVAIR 250Bid, & ALBUTEROL NEBS Tid... stable- min cough, w/o sputum, no change in DOE, etc... ~  PFT's 3/06 showed FVC= 1.49 (68%), FEV1= 1.12 (75%), %1sec=75, mid-flows= 43%pred... ~  [facility] 8/09 w/ bilat pneumonia... see DC Summary. ~  CXR 10/09 showed stable cardiomeg, tort Ao, clear lungs, bilat shoulder arthroplasties... ~  CT Angio Chest 11/09 showed neg for PE, fluid/ mucus plugs in depend LL bronchi... ~  CXR 10/10 showed stable cardiomeg, bilat humeral head prostheses, NAD.Marland Kitchen. ~  CXR 1/12 showed COPD, chr changes, ?right basilar opacity ==> subsequent films w/ incr edema. ~  CXR 5/12 showed cardiomeg, sm right effusion, no edema, bilat shoulder arthroplasties, NAD... ~  10/12:  AB exac treated by TP w/ Augmentin & improved... ~  1/14:  AB exac treated w/ Avelox, Pred taper, NEBS qid, Advair250, Mucinex, etc... ~  CXR 1/14 showed borderline heart size, sm bilat effusions and basilar atx...  ~  3/14: back on Pred 5mg /d and stable once again=> then had yet another exac requiring Rx w/ Levaquin, incr Pred to 20mg /d... ~  4/14:  Much improved on Pred20/d & we decided to decr to 20-10 Qod & recheck 79mo... ~  5/14:  She appears stable & wants to attempt further Pred taper to 10mg /d w/ ROV 65mo=> stable continue the same... ~  8/14:  COPD exac treated w/ Levaquin, bump Pred to 20, continue max Rx=> CXR w/ chr changes, Sput=> NTF; pt reports improved on rx... ~  9/14:  She reports initially better then w/ recurrent cough, dark sputum, no CP/ ch in SOB, f/c/s; we decided to treat again w/ Keflex 500mg  Tid & try to wean the Pred 20-10 Qod.  HYPERTENSION (ICD-401.9) >>  ~  8/12: her BP's have stabilized at home and measures 112/68 here today... she denies CP, palpit, syncope, ch in edema... ~  12/12:on Lisin5/d, Lasix40/d & K20/d; BP= 126/72 & clinically stable w/o CP, palpit, ch in SOB, etc... ~  4/13: on Lisin5/d, Lasix40/d & K20/d; BP= 108/62 & usually sl better at  home; still denies any CP, palpit, ch in SOB, etc... ~  2/14: now on Cozaar25, Lasix40, K20; BP=122/82 & she denies CP, palpit, ch in SOB, edema, etc ~  5/14: on Cozaar25, Lasix40, K20; BP=124/80 & she denies CP, palpit, ch in SOB, edema, etc... ~  8/14: on Cozaar25, Lasix40, K20; BP=118/68 & she denies CP, palpit, ch in SOB, edema, etc... ~  9/14: on same meds- BP=110/72  AORTIC STENOSIS (ICD-424.1)   << followed by [id number] for Cards >> Hx of CHF (ICD-428.0)  ~  2DEcho 3/06 showed mild AS/AI w/ mod incr AoV thickness & reduced leaflet excursion, mild MR, norm EF=55-65%...  ~  2DEcho 12/08 showed mod inc AoV thickness, mod reduced AV leaflet excursion, c/w mod AS, mild AI & mean grad= ;  norm LVF w/ EF= 65% and no wall motion abn etc... ~  2DEcho 1/12 in [place] showed severeAS & mildAI, mild conc LVH, norm sys func w/ EF=60-65%, HK at apex, grI DD... ~  EKG 1/12 showed NSR, poor R progression, NSSTTWA... ~  We do not have follow up notes from [facility], pt indicates no change in meds... ~  11/14: Hosp by [facility] w/ incr SOB, tired and weak, etc; CXR did not show any acute changes; she has known severe AS & not felt to be a surg candidate followed by AVWUJWJXB; she was diuresed several liters and improved, f/u 2DEcho showed critical AS.Marland Kitchen  PERIPHERAL VASCULAR DISEASE (ICD-443.9) - on ASA 81mg /d... she can't walk enough to elicit discomfort, no rest pain or lesions... ~  ABI's 12/01 showed .88 on R, and .95 on L... ~  MRI showed small vessel dis & small infarct noted...  VENOUS INSUFFICIENCY (ICD-459.81) - on low sodium diet, Lasix, KCl...  HYPERLIPIDEMIA (ICD-272.4) - prev on Zocor 40mg  per JYNWGNFAO- off now;  (prev on Pravachol, but stated intol to other Statins)... takes Fish Oil ~2000mg /d rec by her ophthalmologist... ~  FLP 9/05 ?on Prav40 showed TChol 176, TG 190, HDL 55, LDL 83... ~  FLP 4/10 on diet alone showed TChol 211, TG 153, HDL 44, LDL 128... she refuses med Rx- continue diet  efforts. ~  1/12:  Hosp by [facility] & consulted DrKadakia who started her on ZOCOR40mg /d> he is checking her labs now... ~  12/12: she crossed Simva40 off her list ?stopped on her own or ?stopped by [place]... ~  2013:  She still has Simva40 on her list here but NEVER brings a list from [place] or her med bottles to review... ~  9/13:  FLP here showed TChol 206, TG 167, HDL 48, LDL 133... She has agreed to try ATORVASTATIN 20mg /d... ~  2/14: she reports that she is off all statin meds, on diet alone... ~  8/14: med list includes Lip20- she did not bring meds to office, we have never seen notes from [place]...  HIATAL HERNIA (ICD-553.3) - on PRILOSEC 20mg /d & METACLOPRAMIDE 10mg Qhs... GERD (ICD-530.81) - EGD 3/06 by DrStark showed 3cmHH, mild esophagitis...  DIVERTICULOSIS, COLON (ICD-562.10) - last colonoscopy was 6/06 by [facility] showed divertics and ischemic colitis...  ~  Adm 1/12 by [facility] w/ Diverticulitis, treated & improved, she has not followed up w/ GI; she take 1/2 capful of MIRALAX daily.  STRESS INCONTINENCE (ICD-788.39) - she has hx UTI's & dysuria on & off... ~  10/10: urine c/s +EColi & Rx'd w/ Cipro. ~  7/12: she reports vaginal estrogen per Urology has helped her urinary symptoms; note from DrOttelin reviewed. ~  Labs 12/12 w/ normal renal function & Creat=0.7 ~  10/13: she had f/u DrOttelin> Hx trophic vaginitis, dysuria, & recurrent UTIs; rec topical estrogen=> estring (he replaced it that day); PVR=100; he rec ROV in 67mo for estring replacement. ~  2/14 & 5/14: f/u post menopausal atrophic vaginitis, chr cystitis, on Estring but cannot replace it herself- seen Q51mo for this purpose... ~  8/14: she saw Urology for belated estring change, hx atrophic vaginitis, chr cystitis & dysuria...   DEGENERATIVE JOINT DISEASE (ICD-715.90) - she has severe DJD & Gout w/ deformities- Foot XRay showed Charcot foot deformity, severe degenerative dis, etc...  added TRAMADOL + TYLENOL Prn... GOUT,  UNSPECIFIED (ICD-274.9) - labs 4/10 showed Uric Acid level= 7.9.Marland KitchenMarland Kitchen she has hx of gout & hyperuricemia but allergic to Allopurinol w/ rash... therefore  on ULORIC 40mg /d... Uric Acid level 5/11 on Uloric40 = 3.2 LOW BACK PAIN (ICD-724.2) NECK PAIN (ICD-723.1) - eval by Andrea Rubio ==> DrLewitt 2010 w/ MRI & Rx  w/ Lidoderm, Tylenol, Lyrica- now on NEURONTIN... OSTEOPOROSIS (ICD-733.00) - prev on Fosamax (stopped 1/12 in hosp "my kidneys can't handle it"); on Ca++, Vits, & Vit D... ~  labs 4/10 showed Vit D level = 28... rec> start Vit D OTC 1000 u daily. ~  labs 5/11 showed Vit D level = 32... rec incr Vit D to 2000 u daily. ~  2013:  She fell w/ L1 compression & managed by Andrea Rubio w/ MIACALCIN NS & Tramadol... ~  3/14:  BMD here showed TScores +0.7 in Spine, and -2.3 in left Fem Neck, and -1.9 in Forearm radius; she feels the Miacalcin is helping, therefore continue same...  ANXIETY (ICD-300.00) - off prev Alpraz Rx & takes AMITRIPTYLINE 25mg  Qhs... under stress w/ 49 y/o son had MI & stent in 2010.  Hx of ANEMIA (ICD-285.9) - hx anemia req 2U transfusion 6/06 hosp for ischemic colitis...  ~  Hg= 12 - 13 over 2008-9. ~  labs 8/09 hosp showed Hg= 14 ~  labs 4/10 showed Hg= 12.2 ~  labs 3/11 showed Hg= 12.5, MCV= 92, Fe= 22 (9%sat)... start FeSO4 Bid. ~  labs 5/11 showed = 13.0, MCV= 94, Fe= 57 (23%)... continue Fe supplement. ~  Labs 12/12 showed Hg= 13.1, Fe= 35, (14%sat)... rec to continue Fe+VitC Bid... ~  Labs 9/13 showed Hg= 13.7, MCV=94 ~  Labs 8/14 showed Hg= 14.3, MCV= 91  DERM>  She has skin cancer removed from the right side of her face by DrDanJones (Spring2012)...   Past Surgical History  Procedure Laterality Date  . Lumbar laminectomy    . Shoulder surgery      bilateral    Outpatient Encounter Prescriptions as of 04/05/2013  Medication Sig  . albuterol (PROVENTIL) (2.5 MG/3ML) 0.083% nebulizer solution Take 3 mLs (2.5 mg total) by nebulization 4 (four) times daily.  Marland Kitchen aspirin  81 MG tablet Take 81 mg by mouth daily.    Marland Kitchen atorvastatin (LIPITOR) 20 MG tablet Take 20 mg by mouth daily.  . Azelastine-Fluticasone 137-50 MCG/ACT SUSP Place 1-2 sprays into the nose 2 (two) times daily as needed.  . calcitonin, salmon, (MIACALCIN/FORTICAL) 200 UNIT/ACT nasal spray Place 1 spray into the nose daily.  . Calcium Carbonate-Vitamin D (CALCIUM 600+D) 600-400 MG-UNIT per tablet Take 1 tablet by mouth daily.   . Cholecalciferol (VITAMIN D) 1000 UNITS capsule Take 1,000 Units by mouth daily.    . clotrimazole-betamethasone (LOTRISONE) cream Apply 1 application topically 2 (two) times daily.  Marland Kitchen conjugated estrogens (PREMARIN) vaginal cream daily.   . febuxostat (ULORIC) 40 MG tablet Take 40 mg by mouth daily.  . ferrous gluconate (FERGON) 325 MG tablet Take 325 mg by mouth daily with breakfast.   . Fluticasone-Salmeterol (ADVAIR DISKUS) 250-50 MCG/DOSE AEPB Inhale 1 puff into the lungs 2 (two) times daily.  . furosemide (LASIX) 40 MG tablet TAKE 1 TABLET ONCE DAILY.  Marland Kitchen Guaifenesin 1200 MG TB12 Take 1 tablet by mouth 2 (two) times daily.  Marland Kitchen ipratropium (ATROVENT) 0.06 % nasal spray Place 2 sprays into both nostrils 4 (four) times daily.  Marland Kitchen ipratropium-albuterol (DUONEB) 0.5-2.5 (3) MG/3ML SOLN Take 3 mLs by nebulization 4 (four) times daily as needed.  Marland Kitchen ketoconazole (NIZORAL) 2 % shampoo Use as directed  . losartan (COZAAR) 25 MG tablet TAKE 1 TABLET DAILY.  Marland Kitchen metoCLOPramide (  REGLAN) 10 MG tablet TAKE ONE TABLET AT BEDTIME.  . montelukast (SINGULAIR) 10 MG tablet Take 1 tablet by mouth daily.  . Multiple Vitamin (MULTIVITAMIN) capsule Take 1 capsule by mouth daily.    Marland Kitchen omeprazole (PRILOSEC) 20 MG capsule Take 20 mg by mouth daily.  . potassium chloride SA (K-DUR,KLOR-CON) 20 MEQ tablet TAKE 1 TABLET ONCE DAILY.  Marland Kitchen predniSONE (DELTASONE) 20 MG tablet Take prednisone as directed  . Respiratory Therapy Supplies (FLUTTER) DEVI Use as directed  . sertraline (ZOLOFT) 50 MG tablet Take 1  tablet (50 mg total) by mouth daily.  . traMADol (ULTRAM) 50 MG tablet Take 50 mg by mouth every 6 (six) hours as needed.  . [DISCONTINUED] predniSONE (DELTASONE) 20 MG tablet Take 10-20 mg by mouth daily with breakfast. Alternates between 10mg  and 20 mg every other day 20-10-20-10    Allergies  Allergen Reactions  . Allopurinol     REACTION: rash  . Codeine   . Hydrocodone     REACTION: hallucinations  . Morphine     REACTION: hallucinations    Current Medications, Allergies, Past Medical History, Past Surgical History, Family History, and Social History were reviewed in [employer] record.    Review of Systems        See HPI - all other systems neg except as noted... The patient complains of dyspnea on exertion, peripheral edema, abdominal pain, indigestion/heartburn, muscle weakness, and difficulty walking.  The patient denies anorexia, fever, weight loss, weight gain, decreased hearing, hoarseness, chest pain, syncope,  headaches, hemoptysis, melena, hematochezia, hematuria, incontinence, suspicious skin lesions, depression, unusual weight change, abnormal bleeding, enlarged lymph nodes, and angioedema.     Objective:   Physical Exam    WD, Petite, sl overweight, 80 y/o WF in NAD... she is <5' tall, weight 130# GENERAL:  Alert & oriented; pleasant & cooperative... HEENT:  [place]/AT, EACs-clear, TMs-wnl, NOSE-clear, THROAT-clear & wnl... known macular degen per ophthal... NECK:  Supple w/ decrROM; no JVD; normal carotid impulses w/o bruits; no thyromegaly or nodules palpated; no lymphadenopathy. CHEST:  Decr BS bilat, Coarse BS, no wheezing or rhonchi... HEART:  Regular Rhythm;  gr 3/6 holosys M without rubs or gallops detected... ABDOMEN:  Soft & nontender; normal bowel sounds; no organomegaly or masses palpated... EXT: +hand deformities, w/ tophi & mod arthritic changes & contractures... right knee arthritis & severe foot deformities as well. no varicose  veins/+venous insuffic/ tr edema. NEURO:  CN's intact x reduced vision... diffusely weak, without focal changes... +gait abn... DERM:  Abrasion over occiput & on left arm; +ecchymoses, thin skin, etc...  RADIOLOGY DATA:  Reviewed in the EPIC EMR & discussed w/ the patient...  LABORATORY DATA:  Reviewed in the EPIC EMR & discussed w/ the patient...   Assessment & Plan:    AB>  She has an AB exac & refractory symptoms- she finished the prev KEFLEX, try to wean PRED to 20-10 qod, & continue other meds; We discussed the need to maximize her home resp treatments...  HBP & Diastolic CHF>  Controlled on meds- Losatan & Lasix; continue same; she will f/u w/ ZOXWRUEAV as planned.  AS>  Followed by Andrea Rubio for her severe AS, not a surg candidate...  HYPERLIPID>  Off Simva40 now ?per Cards or on her own; she will check w/ DrKadakia==> we don't have notes, apparently off all meds, she agrees to try ATORVASTATIN20=> stopped on her own...  GI>  Hx HH, GERD, Divertics, Colitis>  On Prilosec, Reglan 10mg  Qhs (  she does not want to stop this med), & Miralax as noted...  GU>  Dysuria improved w/ vaginal cream (no estring) & it is changed Q13mo...  DJD/ Gout/ Neck&Back Pain>  As noted- on Tramadol, Tylenol, Uloric; she is weak in the legs, hard to stand & walk, I'm afraid she will become immobile, unable to stand or walk making self care difficult but she is unperturbed by this & refuses my rec for more home PT...  Osteoporosis>  BMD w/ TScore -2.3 in [facility]; she is on Miacalcin NS & feels this is helping- continue same...  Anxiety>  She has Amitriptyline 25Qhs, not on anxiolytic.Marland KitchenMarland Kitchen

## 2013-04-05 NOTE — Patient Instructions (Signed)
Today we updated your med list in our EPIC system...    Continue your current medications the same...  We decided to decrease the Prednisone to 10mg  per day...  For the swelling in your legs>>    NO SALT!!!    Elevate your legs...    Wear support hose...    Continue the Lasix 40mg  each am...  For the dry skin>>    Use a good moisturinzing cream (ask the Pharmacist, try Lac-hydrin or similar...    Consider Calgon bath oil beads in your bath...    Check w/ your Dermatologist if not responding...  Let's plan a follow up visit in 70mo to recheck your breathing.Marland Kitchen..Marland Kitchen

## 2013-04-13 ENCOUNTER — Encounter (HOSPITAL_COMMUNITY): Payer: Self-pay | Admitting: Emergency Medicine

## 2013-04-13 ENCOUNTER — Inpatient Hospital Stay (HOSPITAL_COMMUNITY): Payer: Medicare PPO

## 2013-04-13 ENCOUNTER — Emergency Department (HOSPITAL_COMMUNITY): Payer: Medicare PPO

## 2013-04-13 ENCOUNTER — Inpatient Hospital Stay (HOSPITAL_COMMUNITY)
Admission: EM | Admit: 2013-04-13 | Discharge: 2013-05-03 | DRG: 871 | Disposition: E | Payer: Medicare PPO | Attending: Internal Medicine | Admitting: Internal Medicine

## 2013-04-13 DIAGNOSIS — R6520 Severe sepsis without septic shock: Secondary | ICD-10-CM

## 2013-04-13 DIAGNOSIS — I509 Heart failure, unspecified: Secondary | ICD-10-CM

## 2013-04-13 DIAGNOSIS — E2749 Other adrenocortical insufficiency: Secondary | ICD-10-CM | POA: Diagnosis present

## 2013-04-13 DIAGNOSIS — I10 Essential (primary) hypertension: Secondary | ICD-10-CM

## 2013-04-13 DIAGNOSIS — E785 Hyperlipidemia, unspecified: Secondary | ICD-10-CM | POA: Diagnosis present

## 2013-04-13 DIAGNOSIS — K219 Gastro-esophageal reflux disease without esophagitis: Secondary | ICD-10-CM | POA: Diagnosis present

## 2013-04-13 DIAGNOSIS — D649 Anemia, unspecified: Secondary | ICD-10-CM

## 2013-04-13 DIAGNOSIS — I5033 Acute on chronic diastolic (congestive) heart failure: Secondary | ICD-10-CM | POA: Diagnosis present

## 2013-04-13 DIAGNOSIS — M810 Age-related osteoporosis without current pathological fracture: Secondary | ICD-10-CM | POA: Diagnosis present

## 2013-04-13 DIAGNOSIS — T380X5A Adverse effect of glucocorticoids and synthetic analogues, initial encounter: Secondary | ICD-10-CM | POA: Diagnosis not present

## 2013-04-13 DIAGNOSIS — J4489 Other specified chronic obstructive pulmonary disease: Secondary | ICD-10-CM

## 2013-04-13 DIAGNOSIS — D72829 Elevated white blood cell count, unspecified: Secondary | ICD-10-CM | POA: Diagnosis present

## 2013-04-13 DIAGNOSIS — J449 Chronic obstructive pulmonary disease, unspecified: Secondary | ICD-10-CM

## 2013-04-13 DIAGNOSIS — Z66 Do not resuscitate: Secondary | ICD-10-CM | POA: Diagnosis present

## 2013-04-13 DIAGNOSIS — I739 Peripheral vascular disease, unspecified: Secondary | ICD-10-CM | POA: Diagnosis present

## 2013-04-13 DIAGNOSIS — I5032 Chronic diastolic (congestive) heart failure: Secondary | ICD-10-CM

## 2013-04-13 DIAGNOSIS — Z8249 Family history of ischemic heart disease and other diseases of the circulatory system: Secondary | ICD-10-CM

## 2013-04-13 DIAGNOSIS — I359 Nonrheumatic aortic valve disorder, unspecified: Secondary | ICD-10-CM

## 2013-04-13 DIAGNOSIS — J9690 Respiratory failure, unspecified, unspecified whether with hypoxia or hypercapnia: Secondary | ICD-10-CM

## 2013-04-13 DIAGNOSIS — F411 Generalized anxiety disorder: Secondary | ICD-10-CM

## 2013-04-13 DIAGNOSIS — A419 Sepsis, unspecified organism: Principal | ICD-10-CM

## 2013-04-13 DIAGNOSIS — I5031 Acute diastolic (congestive) heart failure: Secondary | ICD-10-CM | POA: Diagnosis present

## 2013-04-13 DIAGNOSIS — J9620 Acute and chronic respiratory failure, unspecified whether with hypoxia or hypercapnia: Secondary | ICD-10-CM | POA: Diagnosis present

## 2013-04-13 DIAGNOSIS — J9600 Acute respiratory failure, unspecified whether with hypoxia or hypercapnia: Secondary | ICD-10-CM

## 2013-04-13 DIAGNOSIS — Z515 Encounter for palliative care: Secondary | ICD-10-CM

## 2013-04-13 DIAGNOSIS — I214 Non-ST elevation (NSTEMI) myocardial infarction: Secondary | ICD-10-CM | POA: Diagnosis present

## 2013-04-13 DIAGNOSIS — Z79899 Other long term (current) drug therapy: Secondary | ICD-10-CM

## 2013-04-13 DIAGNOSIS — J189 Pneumonia, unspecified organism: Secondary | ICD-10-CM

## 2013-04-13 DIAGNOSIS — M109 Gout, unspecified: Secondary | ICD-10-CM | POA: Diagnosis present

## 2013-04-13 DIAGNOSIS — IMO0002 Reserved for concepts with insufficient information to code with codable children: Secondary | ICD-10-CM

## 2013-04-13 DIAGNOSIS — Z7982 Long term (current) use of aspirin: Secondary | ICD-10-CM

## 2013-04-13 LAB — CBC WITH DIFFERENTIAL/PLATELET
Basophils Absolute: 0 10*3/uL (ref 0.0–0.1)
Basophils Relative: 0 % (ref 0–1)
Eosinophils Absolute: 0 10*3/uL (ref 0.0–0.7)
Eosinophils Relative: 0 % (ref 0–5)
HCT: 41.6 % (ref 36.0–46.0)
HEMOGLOBIN: 13.9 g/dL (ref 12.0–15.0)
LYMPHS ABS: 0.3 10*3/uL — AB (ref 0.7–4.0)
Lymphocytes Relative: 2 % — ABNORMAL LOW (ref 12–46)
MCH: 31 pg (ref 26.0–34.0)
MCHC: 33.4 g/dL (ref 30.0–36.0)
MCV: 92.9 fL (ref 78.0–100.0)
MONOS PCT: 4 % (ref 3–12)
Monocytes Absolute: 0.8 10*3/uL (ref 0.1–1.0)
NEUTROS PCT: 94 % — AB (ref 43–77)
Neutro Abs: 18.4 10*3/uL — ABNORMAL HIGH (ref 1.7–7.7)
Platelets: 161 10*3/uL (ref 150–400)
RBC: 4.48 MIL/uL (ref 3.87–5.11)
RDW: 15.4 % (ref 11.5–15.5)
WBC: 19.5 10*3/uL — ABNORMAL HIGH (ref 4.0–10.5)

## 2013-04-13 LAB — BLOOD GAS, ARTERIAL
Acid-base deficit: 3.7 mmol/L — ABNORMAL HIGH (ref 0.0–2.0)
BICARBONATE: 19.4 meq/L — AB (ref 20.0–24.0)
Delivery systems: POSITIVE
Drawn by: 32526
EXPIRATORY PAP: 6
FIO2: 0.6 %
Inspiratory PAP: 12
LHR: 12 {breaths}/min
O2 Saturation: 98.8 %
PATIENT TEMPERATURE: 98.6
PH ART: 7.475 — AB (ref 7.350–7.450)
TCO2: 20.2 mmol/L (ref 0–100)
pCO2 arterial: 26.6 mmHg — ABNORMAL LOW (ref 35.0–45.0)
pO2, Arterial: 142 mmHg — ABNORMAL HIGH (ref 80.0–100.0)

## 2013-04-13 LAB — URINALYSIS, ROUTINE W REFLEX MICROSCOPIC
Bilirubin Urine: NEGATIVE
GLUCOSE, UA: 500 mg/dL — AB
Ketones, ur: NEGATIVE mg/dL
LEUKOCYTES UA: NEGATIVE
NITRITE: NEGATIVE
PH: 6 (ref 5.0–8.0)
Protein, ur: NEGATIVE mg/dL
SPECIFIC GRAVITY, URINE: 1.013 (ref 1.005–1.030)
Urobilinogen, UA: 0.2 mg/dL (ref 0.0–1.0)

## 2013-04-13 LAB — I-STAT ARTERIAL BLOOD GAS, ED
Acid-base deficit: 4 mmol/L — ABNORMAL HIGH (ref 0.0–2.0)
BICARBONATE: 20.1 meq/L (ref 20.0–24.0)
O2 Saturation: 100 %
PCO2 ART: 34 mmHg — AB (ref 35.0–45.0)
PO2 ART: 258 mmHg — AB (ref 80.0–100.0)
TCO2: 21 mmol/L (ref 0–100)
pH, Arterial: 7.379 (ref 7.350–7.450)

## 2013-04-13 LAB — BASIC METABOLIC PANEL
BUN: 16 mg/dL (ref 6–23)
CO2: 22 mEq/L (ref 19–32)
Calcium: 8.7 mg/dL (ref 8.4–10.5)
Chloride: 101 mEq/L (ref 96–112)
Creatinine, Ser: 0.62 mg/dL (ref 0.50–1.10)
GFR calc Af Amer: >90 mL/min (ref >90)
GFR calc non Af Amer: 79 mL/min — ABNORMAL LOW (ref >90)
GLUCOSE: 123 mg/dL — AB (ref 70–99)
Potassium: 4.1 mEq/L (ref 3.7–5.3)
SODIUM: 140 meq/L (ref 137–147)

## 2013-04-13 LAB — PRO B NATRIURETIC PEPTIDE: Pro B Natriuretic peptide (BNP): 11344 pg/mL — ABNORMAL HIGH (ref 0–450)

## 2013-04-13 LAB — URINE MICROSCOPIC-ADD ON

## 2013-04-13 LAB — PROCALCITONIN: Procalcitonin: 1.85 ng/mL

## 2013-04-13 LAB — MRSA PCR SCREENING: MRSA BY PCR: NEGATIVE

## 2013-04-13 LAB — TROPONIN I: TROPONIN I: 10.91 ng/mL — AB (ref <0.30)

## 2013-04-13 LAB — STREP PNEUMONIAE URINARY ANTIGEN: Strep Pneumo Urinary Antigen: NEGATIVE

## 2013-04-13 MED ORDER — FUROSEMIDE 10 MG/ML IJ SOLN
80.0000 mg | Freq: Once | INTRAMUSCULAR | Status: AC
  Administered 2013-04-13: 80 mg via INTRAVENOUS
  Filled 2013-04-13: qty 8

## 2013-04-13 MED ORDER — DEXTROSE 5 % IV SOLN
1.0000 g | Freq: Once | INTRAVENOUS | Status: AC
  Administered 2013-04-13: 1 g via INTRAVENOUS
  Filled 2013-04-13: qty 10

## 2013-04-13 MED ORDER — IPRATROPIUM-ALBUTEROL 0.5-2.5 (3) MG/3ML IN SOLN
3.0000 mL | Freq: Four times a day (QID) | RESPIRATORY_TRACT | Status: DC
  Administered 2013-04-14 – 2013-04-15 (×4): 3 mL via RESPIRATORY_TRACT
  Filled 2013-04-13 (×6): qty 3

## 2013-04-13 MED ORDER — IPRATROPIUM-ALBUTEROL 0.5-2.5 (3) MG/3ML IN SOLN
3.0000 mL | RESPIRATORY_TRACT | Status: DC
  Administered 2013-04-13 (×2): 3 mL via RESPIRATORY_TRACT
  Filled 2013-04-13 (×2): qty 3

## 2013-04-13 MED ORDER — METHYLPREDNISOLONE SODIUM SUCC 125 MG IJ SOLR
60.0000 mg | Freq: Four times a day (QID) | INTRAMUSCULAR | Status: DC
  Administered 2013-04-13 – 2013-04-15 (×7): 60 mg via INTRAVENOUS
  Filled 2013-04-13 (×10): qty 0.96
  Filled 2013-04-13: qty 2
  Filled 2013-04-13: qty 0.96

## 2013-04-13 MED ORDER — PANTOPRAZOLE SODIUM 40 MG IV SOLR
40.0000 mg | Freq: Every day | INTRAVENOUS | Status: DC
  Administered 2013-04-13: 40 mg via INTRAVENOUS
  Filled 2013-04-13 (×2): qty 40

## 2013-04-13 MED ORDER — DEXTROSE 5 % IV SOLN
500.0000 mg | INTRAVENOUS | Status: DC
  Administered 2013-04-14: 500 mg via INTRAVENOUS
  Filled 2013-04-13 (×3): qty 500

## 2013-04-13 MED ORDER — METOCLOPRAMIDE HCL 10 MG PO TABS
10.0000 mg | ORAL_TABLET | Freq: Every day | ORAL | Status: DC
  Administered 2013-04-13: 10 mg via ORAL
  Filled 2013-04-13 (×2): qty 1

## 2013-04-13 MED ORDER — SODIUM CHLORIDE 0.9 % IJ SOLN
3.0000 mL | Freq: Two times a day (BID) | INTRAMUSCULAR | Status: DC
  Administered 2013-04-13 – 2013-04-15 (×3): 3 mL via INTRAVENOUS

## 2013-04-13 MED ORDER — ENOXAPARIN SODIUM 40 MG/0.4ML ~~LOC~~ SOLN
40.0000 mg | SUBCUTANEOUS | Status: DC
  Administered 2013-04-13: 40 mg via SUBCUTANEOUS
  Filled 2013-04-13 (×2): qty 0.4

## 2013-04-13 MED ORDER — MORPHINE SULFATE 4 MG/ML IJ SOLN
INTRAMUSCULAR | Status: AC
  Filled 2013-04-13: qty 1

## 2013-04-13 MED ORDER — ASPIRIN EC 81 MG PO TBEC
81.0000 mg | DELAYED_RELEASE_TABLET | Freq: Every day | ORAL | Status: DC
  Administered 2013-04-13 – 2013-04-15 (×3): 81 mg via ORAL
  Filled 2013-04-13 (×4): qty 1

## 2013-04-13 MED ORDER — MORPHINE SULFATE 2 MG/ML IJ SOLN: 2.0000 mg | INTRAMUSCULAR | Status: DC | PRN

## 2013-04-13 MED ORDER — ALBUTEROL (5 MG/ML) CONTINUOUS INHALATION SOLN
15.0000 mg/h | INHALATION_SOLUTION | Freq: Once | RESPIRATORY_TRACT | Status: AC
  Administered 2013-04-13: 15 mg/h via RESPIRATORY_TRACT

## 2013-04-13 MED ORDER — IPRATROPIUM-ALBUTEROL 0.5-2.5 (3) MG/3ML IN SOLN
3.0000 mL | Freq: Once | RESPIRATORY_TRACT | Status: DC
  Filled 2013-04-13: qty 3

## 2013-04-13 MED ORDER — FUROSEMIDE 10 MG/ML IJ SOLN
40.0000 mg | Freq: Two times a day (BID) | INTRAMUSCULAR | Status: DC
  Administered 2013-04-13 – 2013-04-15 (×4): 40 mg via INTRAVENOUS
  Filled 2013-04-13 (×6): qty 4

## 2013-04-13 MED ORDER — LORAZEPAM 2 MG/ML IJ SOLN
1.0000 mg | Freq: Once | INTRAMUSCULAR | Status: AC
  Administered 2013-04-13: 0.5 mg via INTRAVENOUS

## 2013-04-13 MED ORDER — ONDANSETRON HCL 4 MG/2ML IJ SOLN
4.0000 mg | Freq: Once | INTRAMUSCULAR | Status: DC
  Filled 2013-04-13: qty 2

## 2013-04-13 MED ORDER — FUROSEMIDE 10 MG/ML IJ SOLN
40.0000 mg | Freq: Once | INTRAMUSCULAR | Status: AC
  Administered 2013-04-13: 40 mg via INTRAVENOUS
  Filled 2013-04-13: qty 4

## 2013-04-13 MED ORDER — DEXTROSE 5 % IV SOLN
500.0000 mg | Freq: Once | INTRAVENOUS | Status: AC
  Administered 2013-04-13: 500 mg via INTRAVENOUS

## 2013-04-13 MED ORDER — PREDNISONE 10 MG PO TABS
10.0000 mg | ORAL_TABLET | Freq: Every day | ORAL | Status: DC
  Filled 2013-04-13: qty 1

## 2013-04-13 MED ORDER — LOSARTAN POTASSIUM 25 MG PO TABS
25.0000 mg | ORAL_TABLET | Freq: Every day | ORAL | Status: DC
  Administered 2013-04-13: 25 mg via ORAL
  Filled 2013-04-13: qty 1

## 2013-04-13 MED ORDER — SODIUM CHLORIDE 0.9 % IV SOLN
250.0000 mL | INTRAVENOUS | Status: DC | PRN
  Administered 2013-04-13 – 2013-04-15 (×2): 250 mL via INTRAVENOUS

## 2013-04-13 MED ORDER — FEBUXOSTAT 40 MG PO TABS
40.0000 mg | ORAL_TABLET | Freq: Every day | ORAL | Status: DC
  Administered 2013-04-13 – 2013-04-15 (×3): 40 mg via ORAL
  Filled 2013-04-13 (×4): qty 1

## 2013-04-13 MED ORDER — MONTELUKAST SODIUM 10 MG PO TABS
10.0000 mg | ORAL_TABLET | Freq: Every day | ORAL | Status: DC
  Administered 2013-04-13 – 2013-04-14 (×2): 10 mg via ORAL
  Filled 2013-04-13 (×3): qty 1

## 2013-04-13 MED ORDER — ASPIRIN 81 MG PO TABS: 81.0000 mg | ORAL_TABLET | Freq: Every day | ORAL | Status: DC

## 2013-04-13 MED ORDER — SERTRALINE HCL 50 MG PO TABS
50.0000 mg | ORAL_TABLET | Freq: Every day | ORAL | Status: DC
  Administered 2013-04-13 – 2013-04-15 (×3): 50 mg via ORAL
  Filled 2013-04-13 (×5): qty 1

## 2013-04-13 MED ORDER — SODIUM CHLORIDE 0.9 % IJ SOLN: 3.0000 mL | INTRAMUSCULAR | Status: DC | PRN

## 2013-04-13 MED ORDER — CALCITONIN (SALMON) 200 UNIT/ACT NA SOLN
1.0000 | Freq: Every day | NASAL | Status: DC
  Administered 2013-04-13 – 2013-04-14 (×2): 1 via NASAL
  Filled 2013-04-13: qty 3.7

## 2013-04-13 MED ORDER — AMITRIPTYLINE HCL 25 MG PO TABS
25.0000 mg | ORAL_TABLET | Freq: Every day | ORAL | Status: DC
  Administered 2013-04-13: 25 mg via ORAL
  Filled 2013-04-13 (×3): qty 1

## 2013-04-13 MED ORDER — ATORVASTATIN CALCIUM 20 MG PO TABS
20.0000 mg | ORAL_TABLET | Freq: Every day | ORAL | Status: DC
  Administered 2013-04-13 – 2013-04-14 (×2): 20 mg via ORAL
  Filled 2013-04-13 (×3): qty 1

## 2013-04-13 MED ORDER — LORAZEPAM 0.5 MG PO TABS
0.5000 mg | ORAL_TABLET | Freq: Four times a day (QID) | ORAL | Status: DC | PRN
  Administered 2013-04-13: 0.5 mg via ORAL
  Filled 2013-04-13: qty 1

## 2013-04-13 MED ORDER — PANTOPRAZOLE SODIUM 40 MG PO TBEC: 40.0000 mg | DELAYED_RELEASE_TABLET | Freq: Every day | ORAL | Status: DC

## 2013-04-13 MED ORDER — LORAZEPAM 2 MG/ML IJ SOLN
INTRAMUSCULAR | Status: AC
  Filled 2013-04-13: qty 1

## 2013-04-13 MED ORDER — DEXTROSE 5 % IV SOLN
1.0000 g | INTRAVENOUS | Status: DC
  Filled 2013-04-13 (×2): qty 10

## 2013-04-13 MED ORDER — LORAZEPAM 2 MG/ML IJ SOLN
1.0000 mg | Freq: Four times a day (QID) | INTRAMUSCULAR | Status: DC | PRN
  Administered 2013-04-13: 1 mg via INTRAVENOUS
  Filled 2013-04-13: qty 1

## 2013-04-13 MED ORDER — FENTANYL CITRATE 0.05 MG/ML IJ SOLN
12.5000 ug | INTRAMUSCULAR | Status: DC | PRN
  Administered 2013-04-13: 25 ug via INTRAVENOUS
  Filled 2013-04-13: qty 2

## 2013-04-13 MED ORDER — ENOXAPARIN SODIUM 40 MG/0.4ML ~~LOC~~ SOLN: 40.0000 mg | SUBCUTANEOUS | Status: DC

## 2013-04-13 MED ORDER — MOMETASONE FURO-FORMOTEROL FUM 100-5 MCG/ACT IN AERO
2.0000 | INHALATION_SPRAY | Freq: Two times a day (BID) | RESPIRATORY_TRACT | Status: DC
  Administered 2013-04-13: 2 via RESPIRATORY_TRACT
  Filled 2013-04-13: qty 8.8

## 2013-04-13 MED ORDER — POTASSIUM CHLORIDE CRYS ER 20 MEQ PO TBCR
20.0000 meq | EXTENDED_RELEASE_TABLET | Freq: Every day | ORAL | Status: DC
  Administered 2013-04-14: 20 meq via ORAL
  Filled 2013-04-13 (×3): qty 1

## 2013-04-13 NOTE — ED Notes (Signed)
This nurse was notified that the patient needed to use the bedpan around 8:10.  Another nurse and tech placed the pt on the bedpan when she went in to respiratory distress.  Pt was breathing 34 per minute and tachy with a heart rate >130.  MD and respiratory notified.  Son was at bedside.

## 2013-04-13 NOTE — ED Notes (Signed)
MD took patient off of O2, patient O2 sats remaining around 90-91% on room air

## 2013-04-13 NOTE — ED Provider Notes (Signed)
CSN: [id number]     Arrival date & time 04/15/2013  0340 History   First MD Initiated Contact with Patient 04/26/2013 0346     Chief Complaint  Patient presents with  . Shortness of Breath      HPI Patient presents emergency department because of developing shortness of breath over the past 24 hours.  She was given a breathing treatment in route with some improvement in her breathing.  She continues to have some shortness of breath at this time.  She does not wear oxygen at home.  As reported by nursing staff that she was hypoxic in route.  She was brought into the emergency department on 4 L nasal cannula.  She has a history of diastolic congestive heart failure as well as severe aortic stenosis for which is not a surgical candidate per the medical records.  History hypertension as well as pneumonia.  Last hospitalization was in November 2014   Past Medical History  Diagnosis Date  . Macular degeneration   . Chronic sinusitis   . Acute asthmatic bronchitis   . Pneumonia   . HTN (hypertension)   . Aortic stenosis   . CHF (congestive heart failure)   . PVD (peripheral vascular disease)   . Venous insufficiency   . Hyperlipidemia   . Hiatal hernia   . GERD (gastroesophageal reflux disease)   . Colon, diverticulosis   . Stress incontinence, female   . DJD (degenerative joint disease)   . Gout   . Low back pain   . Neck pain   . Osteoporosis   . Anxiety   . Anemia    Past Surgical History  Procedure Laterality Date  . Lumbar laminectomy    . Shoulder surgery      bilateral   Family History  Problem Relation Age of Onset  . Heart disease Brother   . Rheum arthritis Mother   . Stroke Father   . Hypertension Mother   . Hypertension Father   . Hypertension Brother   . Hypertension Brother   . Hypertension Brother   . Hypertension Brother   . Hypertension Sister   . Hypertension Sister    History  Substance Use Topics  . Smoking status: Never Smoker   . Smokeless  tobacco: Never Used  . Alcohol Use: No   OB History   Grav Para Term Preterm Abortions TAB SAB Ect Mult Living                 Review of Systems  All other systems reviewed and are negative.      Allergies  Allopurinol; Codeine; Hydrocodone; and Morphine  Home Medications   Current Outpatient Rx  Name  Route  Sig  Dispense  Refill  . amitriptyline (ELAVIL) 25 MG tablet   Oral   Take 25 mg by mouth at bedtime.         Marland Kitchen aspirin 81 MG tablet   Oral   Take 81 mg by mouth daily.           Marland Kitchen atorvastatin (LIPITOR) 20 MG tablet   Oral   Take 20 mg by mouth daily.         . calcitonin, salmon, (MIACALCIN/FORTICAL) 200 UNIT/ACT nasal spray   Nasal   Place 1 spray into the nose daily.         . Calcium Carbonate-Vitamin D (CALCIUM 600+D) 600-400 MG-UNIT per tablet   Oral   Take 1 tablet by mouth daily.          Marland Kitchen  Cholecalciferol (VITAMIN D) 1000 UNITS capsule   Oral   Take 1,000 Units by mouth daily.           . clotrimazole-betamethasone (LOTRISONE) cream   Topical   Apply 1 application topically 2 (two) times daily.         Marland Kitchen conjugated estrogens (PREMARIN) vaginal cream   Vaginal   Place 1 Applicatorful vaginally daily.         . febuxostat (ULORIC) 40 MG tablet   Oral   Take 40 mg by mouth daily.         . ferrous gluconate (FERGON) 325 MG tablet   Oral   Take 325 mg by mouth daily with breakfast.          . Fluticasone-Salmeterol (ADVAIR DISKUS) 250-50 MCG/DOSE AEPB   Inhalation   Inhale 1 puff into the lungs 2 (two) times daily.   60 each   6   . furosemide (LASIX) 40 MG tablet   Oral   Take 40 mg by mouth daily.         Marland Kitchen ipratropium (ATROVENT) 0.06 % nasal spray   Each Nare   Place 2 sprays into both nostrils 4 (four) times daily.         Marland Kitchen ipratropium-albuterol (DUONEB) 0.5-2.5 (3) MG/3ML SOLN   Nebulization   Take 3 mLs by nebulization 3 (three) times daily.         Marland Kitchen ketoconazole (NIZORAL) 2 % shampoo    Topical   Apply 1 application topically every 14 (fourteen) days.         Marland Kitchen losartan (COZAAR) 25 MG tablet   Oral   Take 25 mg by mouth daily.         . metoCLOPramide (REGLAN) 10 MG tablet   Oral   Take 10 mg by mouth at bedtime.         . montelukast (SINGULAIR) 10 MG tablet   Oral   Take 1 tablet by mouth daily.         . Multiple Vitamin (MULTIVITAMIN) capsule   Oral   Take 1 capsule by mouth daily.           Marland Kitchen omeprazole (PRILOSEC) 20 MG capsule   Oral   Take 20 mg by mouth daily.         . potassium chloride SA (K-DUR,KLOR-CON) 20 MEQ tablet   Oral   Take 20 mEq by mouth daily.         . predniSONE (DELTASONE) 20 MG tablet   Oral   Take 10 mg by mouth daily with breakfast.         . sertraline (ZOLOFT) 50 MG tablet   Oral   Take 1 tablet (50 mg total) by mouth daily.   30 tablet   6   . traMADol (ULTRAM) 50 MG tablet   Oral   Take 50 mg by mouth every 6 (six) hours as needed for moderate pain.          BP 136/69  Pulse 108  Temp(Src) 99.1 F (37.3 C) (Oral)  Resp 30  Ht 4\' 11"  (1.499 m)  Wt 124 lb (56.246 kg)  BMI 25.03 kg/m2  SpO2 92% Physical Exam  Nursing note and vitals reviewed. Constitutional: She is oriented to person, place, and time. She appears well-developed and well-nourished. No distress.  HENT:  Head: Normocephalic and atraumatic.  Eyes: EOM are normal.  Neck: Normal range of motion.  Cardiovascular: Normal rate, regular rhythm  and normal heart sounds.   Pulmonary/Chest: Effort normal. No respiratory distress. She has no wheezes. She has rales.  Abdominal: Soft. She exhibits no distension. There is no tenderness.  Musculoskeletal: Normal range of motion.  Neurological: She is alert and oriented to person, place, and time.  Skin: Skin is warm and dry.  Psychiatric: She has a normal mood and affect. Judgment normal.    ED Course  Procedures (including critical care time) Labs Review Labs Reviewed  CBC WITH  DIFFERENTIAL - Abnormal; Notable for the following:    WBC 19.5 (*)    Neutrophils Relative % 94 (*)    Neutro Abs 18.4 (*)    Lymphocytes Relative 2 (*)    Lymphs Abs 0.3 (*)    All other components within normal limits  BASIC METABOLIC PANEL - Abnormal; Notable for the following:    Glucose, Bld 123 (*)    GFR calc non Af Amer 79 (*)    All other components within normal limits  PRO B NATRIURETIC PEPTIDE - Abnormal; Notable for the following:    Pro B Natriuretic peptide (BNP) 11344.0 (*)    All other components within normal limits   Imaging Review Dg Chest 2 View  2013-04-21   CLINICAL DATA Cough, shortness of breath  EXAM CHEST  2 VIEW  COMPARISON Prior radiograph from 12/16/2012  FINDINGS Cardiomegaly is stable as compared to prior study.  Lungs are mildly hypoinflated. There are bilateral pleural effusions, slightly larger on the left. Bibasilar opacities are present, which may reflect atelectasis or infiltrates. Additional patchy opacities within the bilateral upper lobes are also present. . No overt pulmonary edema. No pneumothorax.  Bilateral shoulder arthroplasties are unchanged. No acute osseous abnormality. Degenerative changes with scoliotic curvature again noted within the spine.  IMPRESSION 1. Bilateral pleural effusions, left greater than right. 2. Patchy bibasilar and upper lobe opacities, worrisome for possible multi focal pneumonia. The bibasilar opacities may in part reflect atelectasis. 3. Stable cardiomegaly without overt pulmonary edema.  SIGNATURE  Electronically Signed   By: Rise MuBenjamin  McClintock M.D.   On: 02015-03-20 05:22   I personally reviewed the imaging tests through PACS system I reviewed available ER/hospitalization records through the EMR   EKG Interpretation   Date/Time:  Thursday April 13 2013 03:50:32 EDT Ventricular Rate:  117 PR Interval:  137 QRS Duration: 84 QT Interval:  302 QTC Calculation: 421 R Axis:   -36 Text Interpretation:  Sinus  tachycardia LVH with secondary repolarization  abnormality Anterior infarct, old No significant change was found  Confirmed by Aliyyah Riese  MD, Caryn BeeKEVIN ([id number]) on 2013-04-21 4:50:18 AM      MDM   Final diagnoses:  CAP (community acquired pneumonia)  CHF (congestive heart failure)    Patient admitted for community acquired pneumonia. Rocephin and azithromycin.  It is likely some component of this that is related to her severe order stenosis as she has an elevated BNP and likely some failure on exam.    Lyanne CoKevin M Audry Pecina, MD February 17, 2013 [phone]

## 2013-04-13 NOTE — ED Notes (Signed)
Pt daughter and friend are at bedside.

## 2013-04-13 NOTE — Progress Notes (Signed)
Called 2 heart nurse and gave report. Pt is moving to room 2H10. Called and reported to respiratory that we needed to move pt stat. Pt is resting.

## 2013-04-13 NOTE — ED Notes (Signed)
Per EMS- Became SOB at home, called EMS. Was in the process of giving herself a breathing treatment that she felt was not helping. EMS gave one breathing treatment, pt O2 sats at 100% on 4L via [place]. 99.7 Temp. Breath sounds rhonchi noted. Unable to obtain IV access, L arm with scarring from previous IV infiltrate.

## 2013-04-13 NOTE — ED Notes (Signed)
IV Team paged after second nurse unsuccessful for IV.

## 2013-04-13 NOTE — Progress Notes (Signed)
eLink Physician-Brief Progress Note Patient Name: Andrea Rubio DOB: December 05, 1925 MRN: [id number]  Date of Service  2013/10/24   HPI/Events of Note   Did not tolerate BIPAP Agitation, increased WOB  eICU Interventions  Ativan prn, morhpine prn  Lasix now   Intervention Category Intermediate Interventions: Respiratory distress - evaluation and management Minor Interventions: Agitation / anxiety - evaluation and management  MCQUAID, DOUGLAS 2013/10/24, 9:45 PM

## 2013-04-13 NOTE — Progress Notes (Signed)
I reassessed patient at 6:30pm. She remainds on NPPV, currently having a RR of 38-40. Initially I suspected that anxiety may have contributed as she was administered IV ativan. She was also given lasix and IV antibiotics. Despite these interventions she remainds on NPPV with RR 38, will repeat ABG. I have also discontinued oral prednisone, starting IV SoluMedrol 60 mg IV q 6 hours. I discussed case with PCCM. Will transfer patient to the ICU.

## 2013-04-13 NOTE — Progress Notes (Signed)
Unit CM UR Completed by [facility] ED CM  W. Hubbert Landrigan RN  

## 2013-04-13 NOTE — H&P (Signed)
[facility] History and Physical  Andrea Grillseggy S Sprung WUJ:[id number] DOB: [birth year] DOA: 04/23/2013  Referring physician:  PCP: [name],[name], MD   Chief Complaint: Shortness of breath  HPI: Andrea Rubio is a 80 y.o. female with a past medical history of chronic bronchitis, on chronic steroids, hypertension, diastolic congestive heart failure, aortic stenosis, was last admitted to our service in November of 2014 at which time she was treated for community-acquired pneumonia. She presents to the emergency room with complaints of a one to two-week history of a shortness of breath with associated nonproductive cough. She also reports subjective fevers, chills, malaise, fatigue as well as retrosternal chest pain precipitated by deep inspiration or cough.  She denies recent travel or sick contacts, and has not been on antimicrobial therapy. At baseline patient does not require supplemental oxygen at home, is able to perform activities of daily living, resides with her son. In the emergency room she was found to be in acute hypoxemic respiratory failure having an O2 sat of 89% on room air with a respiratory rate of 30. Initial lab work revealed an elevated white count of 19,500 with a chest x-ray showing multilobar pneumonia. She was administered ceftriaxone and azithromycin IV.                                                            Review of Systems:  Constitutional:  No weight loss, positive for night sweats, Fevers, chills, fatigue.  HEENT:  No headaches, Difficulty swallowing,Tooth/dental problems,Sore throat,  No sneezing, itching, ear ache, nasal congestion, post nasal drip,  Cardio-vascular:  Positive for chest pain with inspiration and cough, denies Orthopnea, PND, swelling in lower extremities, anasarca, dizziness, palpitations  GI:  No heartburn, indigestion, abdominal pain, nausea, vomiting, diarrhea, change in bowel habits, loss of appetite  Resp:  No shortness of breath  with exertion or at rest. No excess mucus, no productive cough, No non-productive cough, No coughing up of blood.No change in color of mucus.No wheezing.No chest wall deformity  Skin:  no rash or lesions.  GU:  no dysuria, change in color of urine, no urgency or frequency. No flank pain.  Musculoskeletal:  No joint pain or swelling. No decreased range of motion. No back pain.  Psych:  No change in mood or affect. No depression or anxiety. No memory loss.   Past Medical History  Diagnosis Date  . Macular degeneration   . Chronic sinusitis   . Acute asthmatic bronchitis   . Pneumonia   . HTN (hypertension)   . Aortic stenosis   . CHF (congestive heart failure)   . PVD (peripheral vascular disease)   . Venous insufficiency   . Hyperlipidemia   . Hiatal hernia   . GERD (gastroesophageal reflux disease)   . Colon, diverticulosis   . Stress incontinence, female   . DJD (degenerative joint disease)   . Gout   . Low back pain   . Neck pain   . Osteoporosis   . Anxiety   . Anemia    Past Surgical History  Procedure Laterality Date  . Lumbar laminectomy    . Shoulder surgery      bilateral   Social History:  reports that she has never smoked. She has never used smokeless tobacco. She reports that she does  not drink alcohol or use illicit drugs.  Allergies  Allergen Reactions  . Allopurinol     REACTION: rash  . Codeine Other (See Comments)    hallucinations  . Hydrocodone     REACTION: hallucinations  . Morphine     REACTION: hallucinations    Family History  Problem Relation Age of Onset  . Heart disease Brother   . Rheum arthritis Mother   . Stroke Father   . Hypertension Mother   . Hypertension Father   . Hypertension Brother   . Hypertension Brother   . Hypertension Brother   . Hypertension Brother   . Hypertension Sister   . Hypertension Sister      Prior to Admission medications   Medication Sig Start Date End Date Taking? Authorizing Provider    amitriptyline (ELAVIL) 25 MG tablet Take 25 mg by mouth at bedtime.   Yes Historical Provider, MD  aspirin 81 MG tablet Take 81 mg by mouth daily.     Yes Historical Provider, MD  atorvastatin (LIPITOR) 20 MG tablet Take 20 mg by mouth daily.   Yes Historical Provider, MD  calcitonin, salmon, (MIACALCIN/FORTICAL) 200 UNIT/ACT nasal spray Place 1 spray into the nose daily.   Yes Historical Provider, MD  Calcium Carbonate-Vitamin D (CALCIUM 600+D) 600-400 MG-UNIT per tablet Take 1 tablet by mouth daily.    Yes Historical Provider, MD  Cholecalciferol (VITAMIN D) 1000 UNITS capsule Take 1,000 Units by mouth daily.     Yes Historical Provider, MD  clotrimazole-betamethasone (LOTRISONE) cream Apply 1 application topically 2 (two) times daily.   Yes Historical Provider, MD  conjugated estrogens (PREMARIN) vaginal cream Place 1 Applicatorful vaginally daily.   Yes Historical Provider, MD  febuxostat (ULORIC) 40 MG tablet Take 40 mg by mouth daily.   Yes Historical Provider, MD  ferrous gluconate (FERGON) 325 MG tablet Take 325 mg by mouth daily with breakfast.    Yes Historical Provider, MD  Fluticasone-Salmeterol (ADVAIR DISKUS) 250-50 MCG/DOSE AEPB Inhale 1 puff into the lungs 2 (two) times daily. 11/29/12  Yes Michele Mcalpine, MD  furosemide (LASIX) 40 MG tablet Take 40 mg by mouth daily.   Yes Historical Provider, MD  ipratropium (ATROVENT) 0.06 % nasal spray Place 2 sprays into both nostrils 4 (four) times daily.   Yes Historical Provider, MD  ipratropium-albuterol (DUONEB) 0.5-2.5 (3) MG/3ML SOLN Take 3 mLs by nebulization 3 (three) times daily.   Yes Historical Provider, MD  ketoconazole (NIZORAL) 2 % shampoo Apply 1 application topically every 14 (fourteen) days.   Yes Historical Provider, MD  losartan (COZAAR) 25 MG tablet Take 25 mg by mouth daily.   Yes Historical Provider, MD  metoCLOPramide (REGLAN) 10 MG tablet Take 10 mg by mouth at bedtime.   Yes Historical Provider, MD  montelukast  (SINGULAIR) 10 MG tablet Take 1 tablet by mouth daily. 11/25/12  Yes Historical Provider, MD  Multiple Vitamin (MULTIVITAMIN) capsule Take 1 capsule by mouth daily.     Yes Historical Provider, MD  omeprazole (PRILOSEC) 20 MG capsule Take 20 mg by mouth daily.   Yes Historical Provider, MD  potassium chloride SA (K-DUR,KLOR-CON) 20 MEQ tablet Take 20 mEq by mouth daily.   Yes Historical Provider, MD  predniSONE (DELTASONE) 20 MG tablet Take 10 mg by mouth daily with breakfast.   Yes Historical Provider, MD  sertraline (ZOLOFT) 50 MG tablet Take 1 tablet (50 mg total) by mouth daily. 03/15/13  Yes Michele Mcalpine, MD  traMADol Janean Sark) 50  MG tablet Take 50 mg by mouth every 6 (six) hours as needed for moderate pain.   Yes Historical Provider, MD   Physical Exam: Filed Vitals:   04/26/2013 0715  BP:   Pulse: 106  Temp:   Resp: 20    BP 136/69  Pulse 106  Temp(Src) 99.1 F (37.3 C) (Oral)  Resp 20  Ht 4\' 11"  (1.499 m)  Wt 56.246 kg (124 lb)  BMI 25.03 kg/m2  SpO2 89%  General:  Chronically ill-appearing, in mild respiratory distress, presently on 4 L supplemental oxygen via nasal cannula Eyes: PERRL, normal lids, irises & conjunctiva ENT: grossly normal hearing, lips & tongue Neck: Jugular venous distention is present, no masses or thyromegaly Cardiovascular: Tachycardic, RRR, no m/r/g. No LE edema. Telemetry: SR, tachycardic  Respiratory: Patient appearing to be in mild respiratory distress, as by basilar crackles present, positive rhonchi, on 4 L of supplemental oxygen via nasal cannula. Did not auscultate wheezing.  Abdomen: soft, ntnd Skin: Has multiple bilateral hematomas Musculoskeletal: Multiple anatomical deformities likely resulted from arthritis Psychiatric: grossly normal mood and affect, speech fluent and appropriate Neurologic: grossly non-focal.          Labs on Admission:  Basic Metabolic Panel:  Recent Labs Lab 04/17/2013 0515  NA 140  K 4.1  CL 101  CO2 22    GLUCOSE 123*  BUN 16  CREATININE 0.62  CALCIUM 8.7   Liver Function Tests: No results found for this basename: AST, ALT, ALKPHOS, BILITOT, PROT, ALBUMIN,  in the last 168 hours No results found for this basename: LIPASE, AMYLASE,  in the last 168 hours No results found for this basename: AMMONIA,  in the last 168 hours CBC:  Recent Labs Lab 04/10/2013 0515  WBC 19.5*  NEUTROABS 18.4*  HGB 13.9  HCT 41.6  MCV 92.9  PLT 161   Cardiac Enzymes: No results found for this basename: CKTOTAL, CKMB, CKMBINDEX, TROPONINI,  in the last 168 hours  BNP (last 3 results)  Recent Labs  12/11/12 1208 12/16/12 1110 04/17/2013 0515  PROBNP 11166.0* 859.0* 11344.0*   CBG: No results found for this basename: GLUCAP,  in the last 168 hours  Radiological Exams on Admission: Dg Chest 2 View  04/30/2013   CLINICAL DATA Cough, shortness of breath  EXAM CHEST  2 VIEW  COMPARISON Prior radiograph from 12/16/2012  FINDINGS Cardiomegaly is stable as compared to prior study.  Lungs are mildly hypoinflated. There are bilateral pleural effusions, slightly larger on the left. Bibasilar opacities are present, which may reflect atelectasis or infiltrates. Additional patchy opacities within the bilateral upper lobes are also present. . No overt pulmonary edema. No pneumothorax.  Bilateral shoulder arthroplasties are unchanged. No acute osseous abnormality. Degenerative changes with scoliotic curvature again noted within the spine.  IMPRESSION 1. Bilateral pleural effusions, left greater than right. 2. Patchy bibasilar and upper lobe opacities, worrisome for possible multi focal pneumonia. The bibasilar opacities may in part reflect atelectasis. 3. Stable cardiomegaly without overt pulmonary edema.  SIGNATURE  Electronically Signed   By: Rise Mu M.D.   On: 04/02/2013 05:22    EKG: Independently reviewed.   Assessment/Plan Principal Problem:   CAP (community acquired pneumonia) Active Problems:    Respiratory failure   Acute diastolic CHF (congestive heart failure)   HYPERLIPIDEMIA   ANXIETY   HYPERTENSION   PERIPHERAL VASCULAR DISEASE   1. Community acquire pneumonia. Patient presenting with clinical signs and symptoms of pneumonia as a chest x-ray in the emergency  room revealed the presence of patchy bibasilar and upper lobe opacities. She is on chronic steroids. We'll start empiric IV antibiotic therapy with ceftriaxone and azithromycin IV, obtain blood cultures, sputum cultures, serum lactate. Admit patient to telemetry, provide supportive care. Followup on repeat a.m. chest x-ray. 2. Acute hypoxemic respiratory failure evidence by a respiratory rate of 30 with room air O2 sat of 89%. Likely secondary to combined community acquire pneumonia and acute decompensated diastolic congestive heart failure. Starting empiric IV antibiotic therapy as well as IV Lasix. Monitor ins and outs. Provide her supplemental oxygen. 3. Acute on chronic diastolic congestive heart failure. Status post transthoracic echocardiogram on 12/13/2012 showing ejection fraction of 55-60% with grade 2 diastolic dysfunction. Lab work revealing an elevated BNP of 11,344, increased from 859 on 12/16/2012. Will provide IV Lasix at 40 mg twice a day, monitor ins and outs, daily weights.  4. Hypertension. Continue Cozaar 25 mg by mouth daily 5. Chronic bronchitis. Patient on chronic steroids, we'll continue her home regimen of prednisone 10 mg by mouth daily 6. Dyslipidemia. Continue statin therapy 7. Gastroesophageal reflux disease. Proton pump inhibitor therapy 8. DVT prophylaxis Lovenox subcutaneous    Code Status: CODE STATUS discussed with patient and son at bedside, patient does not wish to undergo chest compressions or to be placed on life support, will make her a DO NOT RESUSCITATE Family Communication: Spoke with patient and son present at bedside Disposition Plan: Patient will be admitted to the inpatient  service, I anticipate she will require greater than 2 nights hospitalization  Time spent: 70 minutes  Jeralyn Bennett [facility] Pager [phone]

## 2013-04-13 NOTE — Consult Note (Signed)
PULMONARY / CRITICAL CARE MEDICINE   Name: Andrea Rubio MRN: [id number] DOB: 08/09/1925    ADMISSION DATE:  04/12/2013 CONSULTATION DATE:  04/29/2013  REFERRING MD :  Vanessa Barbara PRIMARY SERVICE: [facility]  CHIEF COMPLAINT:  Acute Resp Failure  BRIEF PATIENT DESCRIPTION: 45 with asthma/chronic bronchitis (on pred 20), critical AS, HTN, restrictive lung disease, anxiety d/o. Admitted 3/12 w acute severe dyspnea, B infiltrates. Progressed to BiPAP, to ICU on 3/12.   SIGNIFICANT EVENTS / STUDIES:  TTE 12/13/12 >> normal LV size, grade 2 diastolic dysfxn, critical AS, mild MR  LINES / TUBES: L shoulder PIV 3/12 >>   CULTURES: Blood cx 3/12 >>  resp 3/12 >>   ANTIBIOTICS: Ceftriaxone 3/12 >>  Azithromycin 3/12 >>  HISTORY OF PRESENT ILLNESS:  88 followed by Dr Kriste Basque for asthma/chronic bronchitis (on pred 20), Dr Algie Coffer for critical AS, HTN, diastolic dysfxn. Also with restrictive lung disease from kyphosis and HH, anxiety d/o. She developed acute and severe dyspnea late 3/11 - early 3/12. Denied sick prodrome, fever, chills to me. No cough or purulent mucous, CP. She states she has been doing well since she was seen by Dr Kriste Basque just 04/05/13. On admission she did report chills, possible fever, some retrosternal CP to Dr Vanessa Barbara. CXR showed B upper lobe predominant interstitial / alveolar infiltrates, CBC showed leukocytosis. In ED she exhibited resp distress, hypoxemia, was started on NIPPV. She has had some anxiety treated with Ativan. Her breathing and anxiety have stabilized some since BiPAP was started, but she has still been tachypneic and at times agitated. She is transferred to ICU for further care.   PAST MEDICAL HISTORY :  Past Medical History  Diagnosis Date  . Macular degeneration   . Chronic sinusitis   . Acute asthmatic bronchitis   . Pneumonia   . HTN (hypertension)   . Aortic stenosis   . CHF (congestive heart failure)   . PVD (peripheral vascular disease)   . Venous  insufficiency   . Hyperlipidemia   . Hiatal hernia   . GERD (gastroesophageal reflux disease)   . Colon, diverticulosis   . Stress incontinence, female   . DJD (degenerative joint disease)   . Gout   . Low back pain   . Neck pain   . Osteoporosis   . Anxiety   . Anemia    Past Surgical History  Procedure Laterality Date  . Lumbar laminectomy    . Shoulder surgery      bilateral   Prior to Admission medications   Medication Sig Start Date End Date Taking? Authorizing Provider  amitriptyline (ELAVIL) 25 MG tablet Take 25 mg by mouth at bedtime.   Yes Historical Provider, MD  aspirin 81 MG tablet Take 81 mg by mouth daily.     Yes Historical Provider, MD  atorvastatin (LIPITOR) 20 MG tablet Take 20 mg by mouth daily.   Yes Historical Provider, MD  calcitonin, salmon, (MIACALCIN/FORTICAL) 200 UNIT/ACT nasal spray Place 1 spray into the nose daily.   Yes Historical Provider, MD  Calcium Carbonate-Vitamin D (CALCIUM 600+D) 600-400 MG-UNIT per tablet Take 1 tablet by mouth daily.    Yes Historical Provider, MD  Cholecalciferol (VITAMIN D) 1000 UNITS capsule Take 1,000 Units by mouth daily.     Yes Historical Provider, MD  clotrimazole-betamethasone (LOTRISONE) cream Apply 1 application topically 2 (two) times daily.   Yes Historical Provider, MD  conjugated estrogens (PREMARIN) vaginal cream Place 1 Applicatorful vaginally daily.   Yes Historical Provider, MD  febuxostat (ULORIC) 40 MG tablet Take 40 mg by mouth daily.   Yes Historical Provider, MD  ferrous gluconate (FERGON) 325 MG tablet Take 325 mg by mouth daily with breakfast.    Yes Historical Provider, MD  Fluticasone-Salmeterol (ADVAIR DISKUS) 250-50 MCG/DOSE AEPB Inhale 1 puff into the lungs 2 (two) times daily. 11/29/12  Yes Michele McalpineScott M Nadel, MD  furosemide (LASIX) 40 MG tablet Take 40 mg by mouth daily.   Yes Historical Provider, MD  ipratropium (ATROVENT) 0.06 % nasal spray Place 2 sprays into both nostrils 4 (four) times daily.    Yes Historical Provider, MD  ipratropium-albuterol (DUONEB) 0.5-2.5 (3) MG/3ML SOLN Take 3 mLs by nebulization 3 (three) times daily.   Yes Historical Provider, MD  ketoconazole (NIZORAL) 2 % shampoo Apply 1 application topically every 14 (fourteen) days.   Yes Historical Provider, MD  losartan (COZAAR) 25 MG tablet Take 25 mg by mouth daily.   Yes Historical Provider, MD  metoCLOPramide (REGLAN) 10 MG tablet Take 10 mg by mouth at bedtime.   Yes Historical Provider, MD  montelukast (SINGULAIR) 10 MG tablet Take 1 tablet by mouth daily. 11/25/12  Yes Historical Provider, MD  Multiple Vitamin (MULTIVITAMIN) capsule Take 1 capsule by mouth daily.     Yes Historical Provider, MD  omeprazole (PRILOSEC) 20 MG capsule Take 20 mg by mouth daily.   Yes Historical Provider, MD  potassium chloride SA (K-DUR,KLOR-CON) 20 MEQ tablet Take 20 mEq by mouth daily.   Yes Historical Provider, MD  predniSONE (DELTASONE) 20 MG tablet Take 10 mg by mouth daily with breakfast.   Yes Historical Provider, MD  sertraline (ZOLOFT) 50 MG tablet Take 1 tablet (50 mg total) by mouth daily. 03/15/13  Yes Michele McalpineScott M Nadel, MD  traMADol (ULTRAM) 50 MG tablet Take 50 mg by mouth every 6 (six) hours as needed for moderate pain.   Yes Historical Provider, MD   Allergies  Allergen Reactions  . Allopurinol     REACTION: rash  . Codeine Other (See Comments)    hallucinations  . Hydrocodone     REACTION: hallucinations  . Morphine     REACTION: hallucinations    FAMILY HISTORY:  Family History  Problem Relation Age of Onset  . Heart disease Brother   . Rheum arthritis Mother   . Stroke Father   . Hypertension Mother   . Hypertension Father   . Hypertension Brother   . Hypertension Brother   . Hypertension Brother   . Hypertension Brother   . Hypertension Sister   . Hypertension Sister    SOCIAL HISTORY:  reports that she has never smoked. She has never used smokeless tobacco. She reports that she does not drink  alcohol or use illicit drugs. Lives with her son, able to do her ADL's  REVIEW OF SYSTEMS:  As per the HPI  SUBJECTIVE:  States that her breathing is a bit better with BiPAP, less dyspnea and less anxiety  VITAL SIGNS: Temp:  [97.8 F (36.6 C)-99.7 F (37.6 C)] 97.8 F (36.6 C) (03/12 1520) Pulse Rate:  [34-143] 125 (03/12 1521) Resp:  [18-49] 33 (03/12 1521) BP: (91-161)/(52-94) 91/52 mmHg (03/12 1520) SpO2:  [84 %-100 %] 100 % (03/12 1521) FiO2 (%):  [60 %-100 %] 60 % (03/12 1521) Weight:  [56.246 kg (124 lb)-57.8 kg (127 lb 6.8 oz)] 57.8 kg (127 lb 6.8 oz) (03/12 1520) HEMODYNAMICS:   VENTILATOR SETTINGS: Vent Mode:  [-] BIPAP FiO2 (%):  [60 %-100 %] 60 %  Set Rate:  [10 bmp] 10 bmp PEEP:  [6 cmH20] 6 cmH20 INTAKE / OUTPUT: Intake/Output     03/12 0701 - 03/13 0700   I.V. (mL/kg) 3 (0.1)   Total Intake(mL/kg) 3 (0.1)   Urine (mL/kg/hr) 700 (1)   Total Output 700   Net -697         PHYSICAL EXAMINATION: General:  Chronically ill woman, BiPAP on Neuro:  Awake and alert, interactive, intention tremor, moves all ext HEENT:  OP moist, PERRL Cardiovascular:  Distant heart sounds, 2/6 syst M Lungs:  B insp crackles, no wheezes Abdomen:  Somewhat distended but soft, + BS Musculoskeletal:  Joint deformities B hands and fingers Skin:  Diffuse bruising and thin skin  LABS:  CBC  Recent Labs Lab 04/17/2013 0515  WBC 19.5*  HGB 13.9  HCT 41.6  PLT 161   Coag's No results found for this basename: APTT, INR,  in the last 168 hours BMET  Recent Labs Lab 04/07/2013 0515  NA 140  K 4.1  CL 101  CO2 22  BUN 16  CREATININE 0.62  GLUCOSE 123*   Electrolytes  Recent Labs Lab 04/14/2013 0515  CALCIUM 8.7   Sepsis Markers No results found for this basename: LATICACIDVEN, PROCALCITON, O2SATVEN,  in the last 168 hours ABG  Recent Labs Lab 04/12/2013 0905 04/30/2013 1900  PHART 7.379 7.475*  PCO2ART 34.0* 26.6*  PO2ART 258.0* 142.0*   Liver Enzymes No  results found for this basename: AST, ALT, ALKPHOS, BILITOT, ALBUMIN,  in the last 168 hours Cardiac Enzymes  Recent Labs Lab 04/17/2013 0515  PROBNP 11344.0*   Glucose No results found for this basename: GLUCAP,  in the last 168 hours  Imaging Dg Chest 2 View  04/14/2013   CLINICAL DATA Cough, shortness of breath  EXAM CHEST  2 VIEW  COMPARISON Prior radiograph from 12/16/2012  FINDINGS Cardiomegaly is stable as compared to prior study.  Lungs are mildly hypoinflated. There are bilateral pleural effusions, slightly larger on the left. Bibasilar opacities are present, which may reflect atelectasis or infiltrates. Additional patchy opacities within the bilateral upper lobes are also present. . No overt pulmonary edema. No pneumothorax.  Bilateral shoulder arthroplasties are unchanged. No acute osseous abnormality. Degenerative changes with scoliotic curvature again noted within the spine.  IMPRESSION 1. Bilateral pleural effusions, left greater than right. 2. Patchy bibasilar and upper lobe opacities, worrisome for possible multi focal pneumonia. The bibasilar opacities may in part reflect atelectasis. 3. Stable cardiomegaly without overt pulmonary edema.  SIGNATURE  Electronically Signed   By: Rise Mu M.D.   On: 04/10/2013 05:22   Dg Chest Port 1 View  04/05/2013   CLINICAL DATA:  Shortness of breath, progressing  EXAM: PORTABLE CHEST - 1 VIEW  COMPARISON:  Study obtained earlier in the day  FINDINGS: There is underlying emphysematous change. There is slightly increased airspace consolidation in both upper lobes compared to earlier in the day. Patchy airspace disease in the left mid lung is stable. There are small effusions bilaterally. Heart is enlarged with a degree of pulmonary venous hypertension. There is mild edema.  There are total shoulder prostheses bilaterally.  IMPRESSION: Evidence of a degree of congestive heart failure superimposed on underlying emphysema. Question increased  edema versus superimposed pneumonia in the upper lobes bilaterally.   Electronically Signed   By: Bretta Bang M.D.   On: 04/12/2013 08:42     ASSESSMENT / PLAN:  PULMONARY A: Acute hypoxemic resp failure B infiltrates >  favor acute pulm edema given distribution and absence of infectious prodrome, although note leukocytosis Chronic pred-dependent asthma/bronchitis without wheezing or AE P:   - agree with BiPAP prn, she has clearly benefited and is tolerating. Continue with short breaks while we treat underlying causes - d/c home Advair (substituted as Dulera), use scheduled albuterol/ipratropium  - would quickly (3/13) decrease steroids back to stress dosing or her usual home dose in absence of wheezing - agree with CAP coverage as below - pulm hygiene  CARDIOVASCULAR A: Severe AS (TTE 11/'14) HTN with diastolic dysfxn CHF due to both of the above Hyperlipidemia P:  - good response to lasix 40 in ED, agree with BID dosing and titrate to negative balance as BP and renal fxn tolerate - losartan, ASA, lipitor - may benefit from repeat TTE, but severe AS has already been confirmed. She is not a candidate for valve intervention - favor a PICC for IV access instead of CVC if we are able to obtain  RENAL A:  Normal renal fxn P:   - follow BMP - goal negative I/O as able to tolerate   GASTROINTESTINAL A:  No acute issues P:   - PPI  - NPO for now while on BiPAP  HEMATOLOGIC A:  Leukocytosis, stress response vs infxn vs chronic pred P:  - follow CBC  INFECTIOUS A:  Possible PNA with infiltrates and leukocytosis. No cough, sputum to support P:   - agree with CAP coverage with Azithro + ceftriaxone. Her most recent hospitalization was 4 months ago  ENDOCRINE A:  Probable adrenal insufficiency   P:   - solumedrol ordered currently, favor change to stress hydrocort or chronic pred dosing 3/13 if remains stable  NEUROLOGIC A:  Anxiety d/o, delirium/agitation due to  critical illness P:   - low dose ativan prn - consider haldol if delirium present - home zoloft   GOALS OF CARE I discussed current status, diagnoses, prognosis for recovery to the patient and her family at bedside. Explained I do believe this a reversible process and that BiPAP, all medical care is appropriate. If she worsens to to point of intubation, CPR, pressors, however then she would likely not survive invasive care and interventions. I have recommended move to ICU, continued BiPAP as tolerated, but no MV or ACLS. She understands and agrees with this plan of care. I will place NCB orders in the chart 3/12  I have personally obtained a history, examined the patient, evaluated laboratory and imaging results, formulated the assessment and plan and placed orders. CRITICAL CARE: The patient is critically ill with multiple organ systems failure and requires high complexity decision making for assessment and support, frequent evaluation and titration of therapies, application of advanced monitoring technologies and extensive interpretation of multiple databases. Critical Care Time devoted to patient care services described in this note is 60 minutes.   Levy Pupa, MD, PhD April 25, 2013, 8:16 PM [facility] [phone] or if no answer [phone]

## 2013-04-13 NOTE — ED Notes (Signed)
Internal medicine MD at bedside

## 2013-04-13 NOTE — ED Notes (Signed)
Updated Carlena SaxBlair (Floor RN) pt was on BiPap and admitting doctor was being consulted.

## 2013-04-13 NOTE — Progress Notes (Signed)
Transported from ED at this time to unit 2C room 18. No complications. RT will continue to monitor.

## 2013-04-13 NOTE — ED Provider Notes (Signed)
8:28 AM  Called to room as patient is decompensating. She has been admitted to the hospitalist, Dr. Vanessa BarbaraZamora. Patient admitted for bilateral pneumonia, CHF exacerbation. Per family, patient is on oxygen at home. She also has a history of chronic bronchitis, asthma. No history of tobacco abuse. On exam, patient is tachycardic in the 140s and very anxious, tachypneic with increased work of breathing. She is progress breath sounds bilaterally. Unable to get a good oxygen saturation after multiple attempts. Patient states she does not want to be intubated at this time. Despite having pneumonia, I feel BiPAP is her only option. We'll give albuterol treatments, Lasix, BiPAP.  We'll obtain ABG and repeat x-ray. We'll discuss with hospitalist and she will need a higher level of care.   8:37 AM  Pt appears much more comfortable on BiPAP for now oxygen saturation is 100%. Spoke with Dr. Vanessa BarbaraZamora, hospitalist who will change order from telemetry to step down. We'll closely monitor patient.   9:29 AM  Pt's blood gases reassuring. We have weaned the patient off of BiPAP onto nasal cannula and were giving he her a continuous albuterol treatment. She is unable to tolerate facemask. Will transition back to BiPAP due to increased work of breathing.  Will also give Ativan for anxiolysis.  10:43 AM  Pt stable on bipap.  Awaiting stepdown bed.  11:51 AM  Pt still has increased work of breathing but again is much more comfortable on BiPAP and she is on face mask or nasal cannula. Discussed again with hospitalist who will reassess the patient in the emergency department. Patient has now decided to be a full code.  12:30 PM  Dr. Vanessa BarbaraZamora has seen pt and agrees that she is still stable first set down. She is awaiting stepdown bed.  2:31 PM  Pt has stepdown bed. She is still on BiPAP and is stable. She is mildly tachycardic and tachypneic but sats are 99%.  CRITICAL CARE Performed by: Raelyn NumberWARD, KRISTEN N   Total critical care  time: 60 minutes  Critical care time was exclusive of separately billable procedures and treating other patients.  Critical care was necessary to treat or prevent imminent or life-threatening deterioration.  Critical care was time spent personally by me on the following activities: development of treatment plan with patient and/or surrogate as well as nursing, discussions with consultants, evaluation of patient's response to treatment, examination of patient, obtaining history from patient or surrogate, ordering and performing treatments and interventions, ordering and review of laboratory studies, ordering and review of radiographic studies, pulse oximetry and re-evaluation of patient's condition.     EKG Interpretation  Date/Time:  Thursday April 13 2013 10:22:21 EDT Ventricular Rate:  127 PR Interval:  144 QRS Duration: 89 QT Interval:  299 QTC Calculation: 435 R Axis:   -56 Text Interpretation:  Sinus tachycardia Left anterior fascicular block Anterior infarct, old Confirmed by WARD,  DO, KRISTEN ([id number]) on 04/17/2013 10:44:08 AM        Layla MawKristen N Ward, DO 04/11/2013 1431

## 2013-04-13 NOTE — Progress Notes (Signed)
Called and notified Dr. Vanessa BarbaraZamora that pt is extremely anxious and is pulling at cords and gown and such. Pt's family notified nurse. Pt trying to get out of bed. Nurse assistant placed pt on commode to have a bowel movement. Pt had a bowel movement, small and dark in color. Nurse and nurse tech got pt back to bed. Respirations in high 30's and 40's on bipap. Called notified respiratory that a stat ABG placed. Dr. Vanessa BarbaraZamora came and examined pt. Pt now has orders to go to ICU. Family in room and notified by Dr. Vanessa BarbaraZamora.

## 2013-04-13 NOTE — ED Notes (Signed)
IV Team responded with phone call.

## 2013-04-13 NOTE — Progress Notes (Signed)
Follow up note:  Patient was reassessed in ED, she went into respiratory distress, with RR in the 30's, started on NPPV. ABG obtained, pH= 7.39 pCO2 34. She got better after the administration of ativan. Orders were changed to SDU admission. Patient changed her mind, expressing wishes to rescind previous DNR status and be a full code. Orders changed in chart.

## 2013-04-14 DIAGNOSIS — I509 Heart failure, unspecified: Secondary | ICD-10-CM

## 2013-04-14 DIAGNOSIS — I5032 Chronic diastolic (congestive) heart failure: Secondary | ICD-10-CM

## 2013-04-14 DIAGNOSIS — J449 Chronic obstructive pulmonary disease, unspecified: Secondary | ICD-10-CM

## 2013-04-14 DIAGNOSIS — Z515 Encounter for palliative care: Secondary | ICD-10-CM

## 2013-04-14 LAB — LEGIONELLA ANTIGEN, URINE: Legionella Antigen, Urine: NEGATIVE

## 2013-04-14 MED ORDER — LORAZEPAM 2 MG/ML IJ SOLN: 0.5000 mg | Freq: Four times a day (QID) | INTRAMUSCULAR | Status: DC | PRN

## 2013-04-14 MED ORDER — METOPROLOL TARTRATE 12.5 MG HALF TABLET
12.5000 mg | ORAL_TABLET | Freq: Two times a day (BID) | ORAL | Status: DC
  Administered 2013-04-14: 12.5 mg via ORAL
  Filled 2013-04-14 (×4): qty 1

## 2013-04-14 MED ORDER — FENTANYL CITRATE 0.05 MG/ML IJ SOLN
12.5000 ug | INTRAMUSCULAR | Status: DC | PRN
  Administered 2013-04-14 (×3): 12.5 ug via INTRAVENOUS
  Filled 2013-04-14 (×2): qty 2

## 2013-04-14 MED ORDER — DEXTROSE 5 % IV SOLN
1.0000 g | INTRAVENOUS | Status: DC
  Administered 2013-04-14: 1 g via INTRAVENOUS
  Filled 2013-04-14 (×2): qty 10

## 2013-04-14 MED ORDER — FENTANYL CITRATE 0.05 MG/ML IJ SOLN
25.0000 ug | INTRAMUSCULAR | Status: AC
  Administered 2013-04-14: 25 ug via INTRAVENOUS
  Filled 2013-04-14: qty 2

## 2013-04-14 MED ORDER — LORAZEPAM 2 MG/ML IJ SOLN
INTRAMUSCULAR | Status: AC
  Administered 2013-04-14: 0.5 mg
  Filled 2013-04-14: qty 1

## 2013-04-14 MED ORDER — FENTANYL CITRATE 0.05 MG/ML IJ SOLN
25.0000 ug | INTRAMUSCULAR | Status: DC | PRN
  Administered 2013-04-14 (×2): 50 ug via INTRAVENOUS
  Administered 2013-04-14: 25 ug via INTRAVENOUS
  Administered 2013-04-15: 50 ug via INTRAVENOUS
  Administered 2013-04-15: 25 ug via INTRAVENOUS
  Administered 2013-04-15 – 2013-04-16 (×3): 50 ug via INTRAVENOUS
  Filled 2013-04-14 (×8): qty 2

## 2013-04-14 MED ORDER — FENTANYL CITRATE 0.05 MG/ML IJ SOLN
INTRAMUSCULAR | Status: AC
  Administered 2013-04-14: 12.5 ug via INTRAVENOUS
  Filled 2013-04-14: qty 2

## 2013-04-14 MED ORDER — LISINOPRIL 5 MG PO TABS
5.0000 mg | ORAL_TABLET | Freq: Every day | ORAL | Status: DC
  Administered 2013-04-14: 5 mg via ORAL
  Filled 2013-04-14 (×2): qty 1

## 2013-04-14 MED ORDER — HEPARIN SODIUM (PORCINE) 5000 UNIT/ML IJ SOLN
5000.0000 [IU] | Freq: Three times a day (TID) | INTRAMUSCULAR | Status: DC
  Administered 2013-04-14 – 2013-04-15 (×2): 5000 [IU] via SUBCUTANEOUS
  Filled 2013-04-14 (×6): qty 1

## 2013-04-14 NOTE — Consult Note (Signed)
PULMONARY / CRITICAL CARE MEDICINE  Name: Andrea Rubio MRN: [id number] DOB: 1925-08-16    ADMISSION DATE:  04/24/2013 CONSULTATION DATE:  04/28/2013  [facility]  CHIEF COMPLAINT:  Acute respiratory failure  BRIEF PATIENT DESCRIPTION: [age] with asthma / chronic bronchitis (on chronic Prednisone ), critical AS, HTN, restrictive lung disease and anxiety admitted 3/12 with acute severe dyspnea and bilateral infiltrates.   SIGNIFICANT EVENTS / STUDIES:  11/11  TTE 11/11 >>> Normal LV size, grade 2 diastolic dysfunction, critical AS, mild MR  LINES / TUBES:  CULTURES: 3/12  MRSA PCR >>> 3/12  Blood cx 3/12 >>   ANTIBIOTICS: Ceftriaxone 3/12 >>>  Azithromycin 3/12 >>>  INTERVAL HISTORY:  Refusing care.  VITAL SIGNS: Temp:  [97.8 F (36.6 C)-101.2 F (38.4 C)] 98 F (36.7 C) (03/13 0900) Pulse Rate:  [34-143] 94 (03/13 0700) Resp:  [25-50] 31 (03/13 0700) BP: (91-161)/(38-94) 121/56 mmHg (03/12 2256) SpO2:  [84 %-100 %] 100 % (03/13 0700) FiO2 (%):  [60 %] 60 % (03/12 2050) Weight:  [57.8 kg (127 lb 6.8 oz)-58.3 kg (128 lb 8.5 oz)] 58.3 kg (128 lb 8.5 oz) (03/13 0500)  HEMODYNAMICS:   VENTILATOR SETTINGS: Vent Mode:  [-] BIPAP FiO2 (%):  [60 %] 60 % Set Rate:  [10 bmp] 10 bmp  INTAKE / OUTPUT: Intake/Output     03/12 0701 - 03/13 0700 03/13 0701 - 03/14 0700   I.V. (mL/kg) 3 (0.1)    Total Intake(mL/kg) 3 (0.1)    Urine (mL/kg/hr) 1700 (1.2)    Total Output 1700     Net -1697           PHYSICAL EXAMINATION: General:  Resting in no distress Neuro:  Sleepy but arouses to voice HEENT:  PERRL Cardiovascular:  Regular, systolic murmur Lungs:  Diffuse rales Abdomen:  Soft, bowel sounds present Musculoskeletal:  Moves all extremities Skin:  Diffuse bruising  LABS:  CBC  Recent Labs Lab 04/17/2013 0515  WBC 19.5*  HGB 13.9  HCT 41.6  PLT 161   Coag's No results found for this basename: APTT, INR,  in the last 168  hours  BMET  Recent Labs Lab 04/05/2013 0515  NA 140  K 4.1  CL 101  CO2 22  BUN 16  CREATININE 0.62  GLUCOSE 123*   Electrolytes  Recent Labs Lab 04/28/2013 0515  CALCIUM 8.7   Sepsis Markers  Recent Labs Lab 04/19/2013 2156  PROCALCITON 1.85   ABG  Recent Labs Lab 04/09/2013 0905 04/22/2013 1900  PHART 7.379 7.475*  PCO2ART 34.0* 26.6*  PO2ART 258.0* 142.0*   Liver Enzymes No results found for this basename: AST, ALT, ALKPHOS, BILITOT, ALBUMIN,  in the last 168 hours Cardiac Enzymes  Recent Labs Lab 04/21/2013 0515 05/01/2013 2156  TROPONINI  --  10.91*  PROBNP 11344.0*  --    Glucose No results found for this basename: GLUCAP,  in the last 168 hours  IMAGING:   Dg Chest 2 View  04/28/2013   CLINICAL DATA Cough, shortness of breath  EXAM CHEST  2 VIEW  COMPARISON Prior radiograph from 12/16/2012  FINDINGS Cardiomegaly is stable as compared to prior study.  Lungs are mildly hypoinflated. There are bilateral pleural effusions, slightly larger on the left. Bibasilar opacities are present, which may reflect atelectasis or infiltrates. Additional patchy opacities within the bilateral upper lobes are also present. . No overt pulmonary edema. No pneumothorax.  Bilateral shoulder arthroplasties are unchanged. No acute osseous  abnormality. Degenerative changes with scoliotic curvature again noted within the spine.  IMPRESSION 1. Bilateral pleural effusions, left greater than right. 2. Patchy bibasilar and upper lobe opacities, worrisome for possible multi focal pneumonia. The bibasilar opacities may in part reflect atelectasis. 3. Stable cardiomegaly without overt pulmonary edema.  SIGNATURE  Electronically Signed   By: Rise Mu M.D.   On: 04/17/2013 05:22   Dg Chest Port 1 View  04/23/2013   CLINICAL DATA:  Shortness of breath, progressing  EXAM: PORTABLE CHEST - 1 VIEW  COMPARISON:  Study obtained earlier in the day  FINDINGS: There is underlying emphysematous  change. There is slightly increased airspace consolidation in both upper lobes compared to earlier in the day. Patchy airspace disease in the left mid lung is stable. There are small effusions bilaterally. Heart is enlarged with a degree of pulmonary venous hypertension. There is mild edema.  There are total shoulder prostheses bilaterally.  IMPRESSION: Evidence of a degree of congestive heart failure superimposed on underlying emphysema. Question increased edema versus superimposed pneumonia in the upper lobes bilaterally.   Electronically Signed   By: Bretta Bang M.D.   On: 04/17/2013 08:42   ASSESSMENT / PLAN:  PULMONARY A:  Acute hypoxemic resp failure Likely acute pulmonary edema Less likely CAP Chronic prednisone-dependent asthma / bronchitis without evidence of acute bronchospasm P:   Goal SpO2>92 Supplemental oxygen Solu-Medrol 60 mg q6h DuoNebs No BiPAP / intubation  CARDIOVASCULAR A:  NSTEMI Acute on chronic diastolic heart failure Severe AS P:  ASA, Lipitor Add Metoprolol 12.5 mg bid and Lisinopril 5 mg daily Defer Heparin gtt Would not get Cardiology involved unless there is an indication form family that the aggressive care is desired   RENAL A:   Normal renal function Hypervolemia P:   Trend BMP Lasix 40 mg IV bid  GASTROINTESTINAL A:   Nutrition GI Px is not required P:   NPO as respiratory distress  HEMATOLOGIC A:   Steroid induced leucocytosis VTE Px P:  Trend CBC Heparin   INFECTIOUS A:   Suspected CAP P:   Abx / cx as above  ENDOCRINE A:   Suspected  adrenal insufficiency as on chronic steroids P:   Already on Solu-Medrol  NEUROLOGIC A:   Anxiety d/o Delirium / agitation Refusing cae P:   Continue Fentanyl PRN Ativan PRN Elavil, Zoloft D/c Reglan  Transfer to medical floor 3/13.  [facility] to assume care 3/14.  PCCM will sign off.  Palliative consultation requested.  Family already decided on NO intubation / NO ACLS;  considering full comfort measures.  I have personally obtained history, examined patient, evaluated and interpreted laboratory and imaging results, reviewed medical records, formulated assessment / plan and placed orders.  Lonia Farber, MD Pulmonary and Critical Care Medicine [facility] Pager: [phone]  04/14/2013, 9:36 AM

## 2013-04-14 NOTE — Progress Notes (Signed)
eLink Physician-Brief Progress Note Patient Name: Andrea Rubio DOB: 1925/10/10 MRN: [id number]  Date of Service  04/14/2013   HPI/Events of Note  Troponin I 10.91 Mod to severe resp distress Has refused BiPAP   eICU Interventions  Discussed with pt's daughter. We agree that her comfort is highest priority. Discussed with RN. Will use fentanyl and BiPAP (if she will accept it) to achieve an acceptable level of comfort   Intervention Category Major Interventions: Respiratory failure - evaluation and management  [name] FischerDavid Lathon Adan 04/14/2013, 12:41 AM

## 2013-04-14 NOTE — Progress Notes (Signed)
CRITICAL VALUE ALERT  Critical value received: troponin 10.91  Date of notification:  04/29/2013  Time of notification:  2200  Critical value read back:yes  Nurse who received alert:  Wendelyn BreslowSharon Cheryel Kyte, RN  MD notified (1st page):  Dr. Sung AmabileSimonds  Time of first page:  2210  MD notified (2nd page): 0  Time of second page:0  Responding MD:  Dr. Sung AmabileSimonds  Time MD responded:  2220

## 2013-04-14 NOTE — Progress Notes (Signed)
Pt had been awake, alert and visiting with family this morning and early afternoon.  After eating lunch and having a bowel movement on the bedpan, pt became listless. Dr.  Marin ShutterZubelevitskiy at bedside speaking to family.  Comfort measures provided to patient and family.

## 2013-04-14 NOTE — Progress Notes (Signed)
Notified Dr. Sung AmabileSimonds that patient was resting comfortably and that lab was here to stick patient for next troponin and morning labs. He stated that it was ok for lab to wait a couple of hours before getting troponin and morning labs.

## 2013-04-14 NOTE — Progress Notes (Signed)
Multiple conversations with MD tonight regarding patient comfort level and agitation with BiPAP mask and BP cuff. Family has been at bedside and have consoled patient multiple times. Lab values reported to MD. Will continue to maintain patient comfort level as much as possible.

## 2013-04-14 NOTE — Progress Notes (Signed)
Unable to get accurate BP reading. Patient tenses arm and unable to keep arm relaxed enough to get correct reading.

## 2013-04-14 NOTE — Progress Notes (Signed)
Chaplain gave support to pt's two daughters, son, and son in law through compassionate conversation and empathic listening. Will notify on call chaplain for further follow up.   04/14/13 1600  Clinical Encounter Type  Visited With Patient and family together  Visit Type Critical Care;Patient actively dying  Referral From Nurse  Spiritual Encounters  Spiritual Needs Emotional    Rulon Abideavid B Sherrod, chaplain pager [phone]

## 2013-04-15 DIAGNOSIS — A419 Sepsis, unspecified organism: Secondary | ICD-10-CM | POA: Diagnosis present

## 2013-04-15 LAB — PROCALCITONIN: Procalcitonin: 18.69 ng/mL

## 2013-04-15 LAB — TROPONIN I: Troponin I: >20 ng/mL (ref <0.30)

## 2013-04-15 MED ORDER — LORAZEPAM 2 MG/ML IJ SOLN
1.0000 mg | INTRAMUSCULAR | Status: DC | PRN
  Administered 2013-04-16: 2 mg via INTRAVENOUS
  Filled 2013-04-15: qty 1

## 2013-04-15 MED ORDER — SCOPOLAMINE 1 MG/3DAYS TD PT72
1.0000 | MEDICATED_PATCH | TRANSDERMAL | Status: DC
  Administered 2013-04-15: 1.5 mg via TRANSDERMAL
  Filled 2013-04-15: qty 1

## 2013-04-15 MED ORDER — LORAZEPAM 2 MG/ML IJ SOLN
0.5000 mg | Freq: Two times a day (BID) | INTRAMUSCULAR | Status: DC
  Administered 2013-04-15: 0.5 mg via INTRAVENOUS
  Filled 2013-04-15: qty 1

## 2013-04-15 NOTE — Progress Notes (Signed)
Palliative Medicine Meeting scheduled for 3/15 at 11AM. I have spoken with her daughter over the phone and I will place comfort care order until we can meet tomorrow. Call PMT withany urgent needs. [phone].  Anderson MaltaElizabeth Cardell Rachel, DO Palliative Medicine

## 2013-04-15 NOTE — Progress Notes (Signed)
[facility] Progress Note [place] TEAM 1 - Stepdown/ICU TEAM   ANALIAH DRUM UJW:[id number] DOB: [birth year] DOA: 2013/04/29 PCP: Michele Mcalpine, MD  Brief narrative: Andrea Rubio is a 80 y.o. female presenting on 04-29-13 with  has a past medical history of Macular degeneration; Chronic sinusitis; Acute asthmatic bronchitis; restrictive lung disease HTN (hypertension); Aortic stenosis; CHF (congestive heart failure); PVD (peripheral vascular disease); Venous insufficiency; Hyperlipidemia; Hiatal hernia; GERD (gastroesophageal reflux disease); Colon, diverticulosis; DJD (degenerative joint disease); Gout; Low back pain; Neck pain; Osteoporosis; Anxiety; and Anemia who presents with acute resp failure, sepsis, Acute MI and congestive heart failure.    Subjective: Asking for oatmeal. No complaints. i have had a long discussion with her daughter, Gunnar Fusi, who states that she and her family are in agreement to keep her mother comfortable and stop all aggressive treatments. She is agreeable to stop antibiotics and other medications except comfort meds and allowing her to eat as she wishes.   Assessment/Plan: Principal Problem:   Respiratory failure  CAP - - worsening - increased pro calcitonin noted             - will d/c antibiotics   Acute diastolic CHF (congestive heart failure) - d/c lasix   Active Problems: Acute MI - will not treat- she is asymptomatic    HYPERLIPIDEMIA   ANXIETY   HYPERTENSION   PERIPHERAL VASCULAR DISEASE  Cont IV Fentanyl, Ativan and O2     Code Status: DNR Family Communication: daugther Paula Disposition Plan: transfer to med surg- family not yet certain if they will take her home  Consultants: PCCM  Procedures: none  Antibiotics: Antibiotics Given (last 72 hours)   Date/Time Action Medication Dose Rate   04/14/13 1203 Given   cefTRIAXone (ROCEPHIN) 1 g in dextrose 5 % 50 mL IVPB 1 g 100 mL/hr   04/14/13 1253 Given   azithromycin  (ZITHROMAX) 500 mg in dextrose 5 % 250 mL IVPB 500 mg 250 mL/hr       DVT prophylaxis: D/c Lovenox  Objective: Filed Weights   29-Apr-2013 0349 04/29/13 1520 04/14/13 0500  Weight: 56.246 kg (124 lb) 57.8 kg (127 lb 6.8 oz) 58.3 kg (128 lb 8.5 oz)   Blood pressure 103/63, pulse 103, temperature 98.4 F (36.9 C), temperature source Oral, resp. rate 31, height 4\' 11"  (1.499 m), weight 58.3 kg (128 lb 8.5 oz), SpO2 86.00%.  Intake/Output Summary (Last 24 hours) at 04/15/13 1258 Last data filed at 04/15/13 1100  Gross per 24 hour  Intake    745 ml  Output     50 ml  Net    695 ml     Exam: General: AAO x 3, No acute respiratory distress Lungs: b/l rhonchi and coarse breath sounds Cardiovascular: Regular rate and rhythm - tachycardic Abdomen: Nontender, nondistended, soft, bowel sounds positive, no rebound, no ascites, no appreciable mass Extremities: No significant cyanosis, clubbing, or edema bilateral lower extremities  Data Reviewed: Basic Metabolic Panel:  Recent Labs Lab 04-29-2013 0515  NA 140  K 4.1  CL 101  CO2 22  GLUCOSE 123*  BUN 16  CREATININE 0.62  CALCIUM 8.7   Liver Function Tests: No results found for this basename: AST, ALT, ALKPHOS, BILITOT, PROT, ALBUMIN,  in the last 168 hours No results found for this basename: LIPASE, AMYLASE,  in the last 168 hours No results found for this basename: AMMONIA,  in the last 168 hours CBC:  Recent Labs Lab Apr 29, 2013 0515  WBC 19.5*  NEUTROABS 18.4*  HGB 13.9  HCT 41.6  MCV 92.9  PLT 161   Cardiac Enzymes:  Recent Labs Lab 04/30/2013 2156 04/15/13 0410  TROPONINI 10.91* >20.00*   BNP (last 3 results)  Recent Labs  12/11/12 1208 12/16/12 1110 04/12/2013 0515  PROBNP 11166.0* 859.0* 11344.0*   CBG: No results found for this basename: GLUCAP,  in the last 168 hours  Recent Results (from the past 240 hour(s))  MRSA PCR SCREENING     Status: None   Collection Time    04/20/2013  3:32 PM      Result  Value Ref Range Status   MRSA by PCR NEGATIVE  NEGATIVE Final   Comment:            The GeneXpert MRSA Assay (FDA     approved for NASAL specimens     only), is one component of a     comprehensive MRSA colonization     surveillance program. It is not     intended to diagnose MRSA     infection nor to guide or     monitor treatment for     MRSA infections.  CULTURE, BLOOD (ROUTINE X 2)     Status: None   Collection Time    04/22/2013  5:15 PM      Result Value Ref Range Status   Specimen Description BLOOD RIGHT ARM   Final   Special Requests BOTTLES DRAWN AEROBIC ONLY 3CC   Final   Culture  Setup Time     Final   Value: 04/06/2013 22:28     Performed at [facility]   Culture     Final   Value:        BLOOD CULTURE RECEIVED NO GROWTH TO DATE CULTURE WILL BE HELD FOR 5 DAYS BEFORE ISSUING A FINAL NEGATIVE REPORT     Performed at [facility]   Report Status PENDING   Incomplete  CULTURE, BLOOD (ROUTINE X 2)     Status: None   Collection Time    04/10/2013  9:56 PM      Result Value Ref Range Status   Specimen Description BLOOD LEFT HAND   Final   Special Requests BOTTLES DRAWN AEROBIC ONLY 8CC   Final   Culture  Setup Time     Final   Value: 04/14/2013 03:55     Performed at [facility]   Culture     Final   Value:        BLOOD CULTURE RECEIVED NO GROWTH TO DATE CULTURE WILL BE HELD FOR 5 DAYS BEFORE ISSUING A FINAL NEGATIVE REPORT     Performed at [facility]   Report Status PENDING   Incomplete     Studies:  Recent x-ray studies have been reviewed in detail by the Attending Physician  Scheduled Meds:  Scheduled Meds: . amitriptyline  25 mg Oral QHS  . aspirin EC  81 mg Oral Daily  . febuxostat  40 mg Oral Daily  . heparin subcutaneous  5,000 Units Subcutaneous 3 times per day  . ipratropium-albuterol  3 mL Nebulization Q6H  . sertraline  50 mg Oral Daily  . sodium chloride  3 mL Intravenous Q12H   Continuous Infusions:   Time  spent on care of this patient: > 35 min   Calvert Cantor, MD  [facility] Office  [phone] Pager - Text Page per Loretha Stapler as per below:  On-Call/Text Page:      [email or web address]  If 7PM-7AM, please contact night-coverage [email or web address] 04/15/2013, 12:58 PM   LOS: 2 days

## 2013-04-16 DIAGNOSIS — D649 Anemia, unspecified: Secondary | ICD-10-CM

## 2013-04-16 DIAGNOSIS — F411 Generalized anxiety disorder: Secondary | ICD-10-CM

## 2013-04-16 MED ORDER — IPRATROPIUM-ALBUTEROL 0.5-2.5 (3) MG/3ML IN SOLN: 3.0000 mL | Freq: Four times a day (QID) | RESPIRATORY_TRACT | Status: DC | PRN

## 2013-04-16 MED ORDER — SODIUM CHLORIDE 0.9 % IV BOLUS (SEPSIS): 250.0000 mL | Freq: Once | INTRAVENOUS | Status: DC

## 2013-04-16 MED ORDER — HALOPERIDOL LACTATE 2 MG/ML PO CONC
0.5000 mg | Freq: Four times a day (QID) | ORAL | Status: DC | PRN
  Administered 2013-04-16: 1 mg via ORAL
  Filled 2013-04-16: qty 0.5

## 2013-04-19 LAB — CULTURE, BLOOD (ROUTINE X 2): CULTURE: NO GROWTH

## 2013-04-20 LAB — CULTURE, BLOOD (ROUTINE X 2): Culture: NO GROWTH

## 2013-05-03 NOTE — Progress Notes (Signed)
Hospitalist paged regarding increased respirations, HR, and lethargy.  See new order for fluid bolus.  IV team also paged d/t IV access not viable at this time.  Awaiting IV team call back

## 2013-05-03 NOTE — Discharge Summary (Addendum)
Expiration Note/ Death Summary  Andrea Rubio  MR#: [id number]  DOB:12-Jan-1926  Date of Admission: 04/17/2013 Date of Death: 04/11/2013 at 10:49 AM  Attending Physician:Hazell Siwik  Patient's PCP: [name],[name], MD  Consults: Treatment Team: Pulmonary critical care team, Dr. Marin ShutterZubelevitskiy Palliative medicine, Dr. Phillips OdorGolding  Cause of Death: Acute on chronic respiratory failure, on chronic prednisone  Secondary Diagnoses  . CAP (community acquired pneumonia)- multilobar. . chronic respiratory failure secondary to chronic bronchitis, steroid dependent . Acute diastolic CHF (congestive heart failure) . HYPERTENSION . PERIPHERAL VASCULAR DISEASE . ANXIETY . HYPERLIPIDEMIA . Sepsis secondary to pneumonia History of critical aortic stenosis.  Brief H and P: For complete details please refer to admission H and P, but in brief the patient was a 80 year old femalewith a past medical history of chronic bronchitis, on chronic steroids, hypertension, diastolic congestive heart failure, aortic stenosis, who was last admitted in November 2014 at which time she was treated for community-acquired pneumonia. The patient presented with a one to two-week history of shortness of breath with associated nonproductive cough, subjective fevers and chills, malaise, fatigue and retrosternal chest pain precipitated by deep inspiration or coughing. In the emergency room she was found to be in acute hypoxemic respiratory failure, having O2 sats of 89% on room air with rest rate of 30. Patient had leukocytosis with white count of 19.5k and chest x-ray showed multilobar pneumonia. Patient was placed on IV antibiotics and admitted to step down unit.    Hospital Course: patient was a 80 year old femalewith a past medical history of chronic bronchitis, on chronic steroids, hypertension, diastolic congestive heart failure, aortic stenosis, was admitted with acute respiratory failure, multilobar pneumonia and likely  sepsis due to the infection, also had NSTEMI with congestive heart failure. The patient had severe dyspnea and bilateral infiltrates. Patient had echocardiogram in 11 2014 which had shown EF of 55-60%, grade 2 diastolic dysfunction with critical aortic stenosis,Valve area: 0.44cm^2.  Acute on chronic hypoxemic respiratory failure: Steroid-dependent, patient presented with severe dyspnea and bilateral infiltrates, multilobar pneumonia on the chest x-ray. Patient was in respiratory distress with respiration rate in the 30s, was placed on BiPAP. The patient was admitted to intensive care unit and critical care service was consulted. Her hospitalization course was also complicated by her history of critical aortic stenosis and acute on chronic diastolic congestive heart failure. Patient remained on critical care service, she was placed on Lasix for diuresis. Patient had not been deemed a candidate for valve intervention. Dr. Corliss BlackerByram discussed in detail with patient and her family members who requested for no CODE BLUE status if her respiratory condition deteriorates.   NSTEMI with acute on chronic diastolic heart failure, critical aortic stenosis : On 04/14/2013, patient was noted to have a troponin of 10.9. Critical care service also discussed in detail with patient's family who requested for full comfort care. Palliative medicine was consulted and meeting was scheduled on 04/17/2013 at 11 AM. Dr. Phillips OdorGolding placed comfort care orders. However patient expired at 10:49 AM with family members at her bedside on 04/07/2013   Signed:  [name] M.D. [facility] 04/21/2013, 11:58 AM Pager: [phone]   Addendum: Additional secondary discharge diagnosis  Suspected/Probable adrenal insufficiency as on chronic steroids    [name] M.D. [facility] 05/12/2013, 6:10 PM  Pager: [phone]

## 2013-05-03 NOTE — Progress Notes (Signed)
Pt respirations becoming more labored, using accessory muscles, o2 sats 94-99 on 2.5 L [place].  Pt responds to voice and answers appropriately.  Daughter at bedside.  Will continue to monitor.

## 2013-05-03 NOTE — Progress Notes (Addendum)
Patient is unresponsive, a DNR, no pulse, no respirations, next of kin at bedside at this time, pt. Expired at 10:49, second nurse Launa FlightJoann Hamze verified, Dr. Isidoro Donningai was made aware, [place] [facility] also notified.

## 2013-05-03 DEATH — deceased

## 2013-05-12 IMAGING — CR DG CHEST 2V
2 series · 2 of 2 positions shown · non-contrast
Comparison: 02/28/2010

CLINICAL DATA: Short of breath.  CHF.

CHEST - 2 VIEW

[view not recorded (1 of 2)]
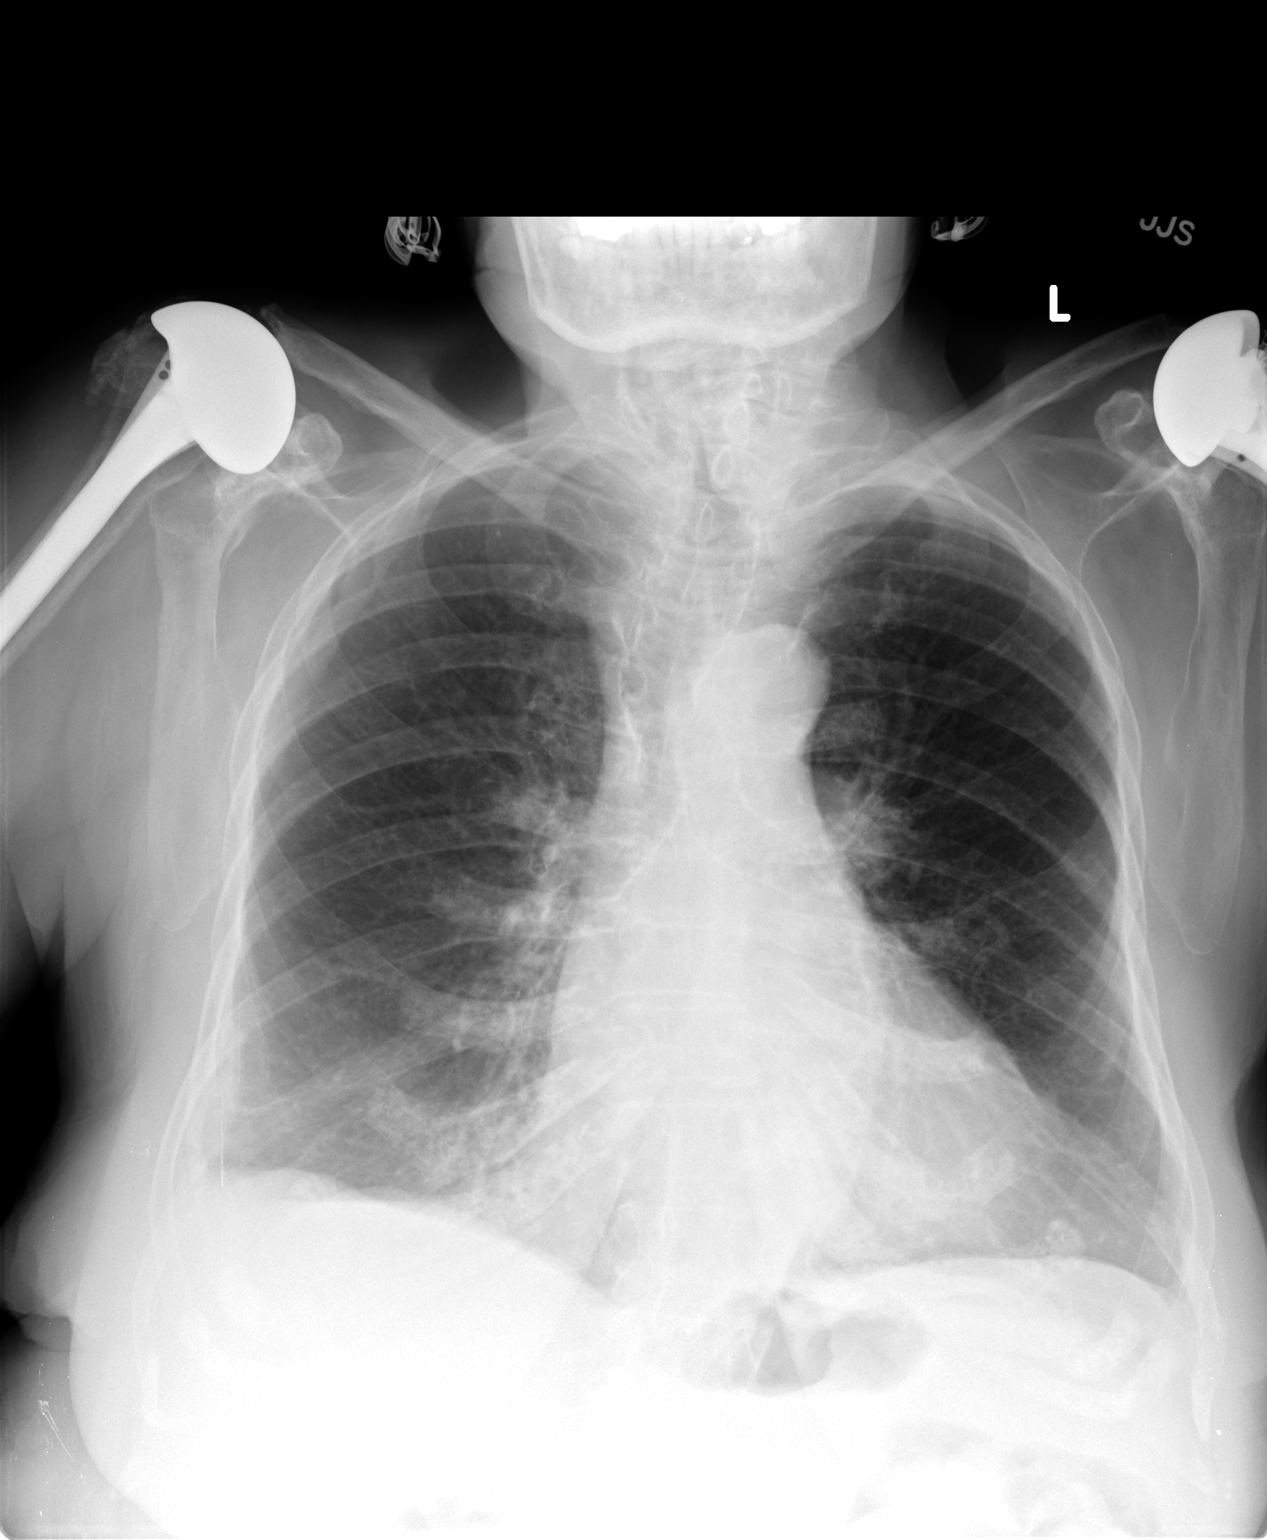

[view not recorded (2 of 2)]
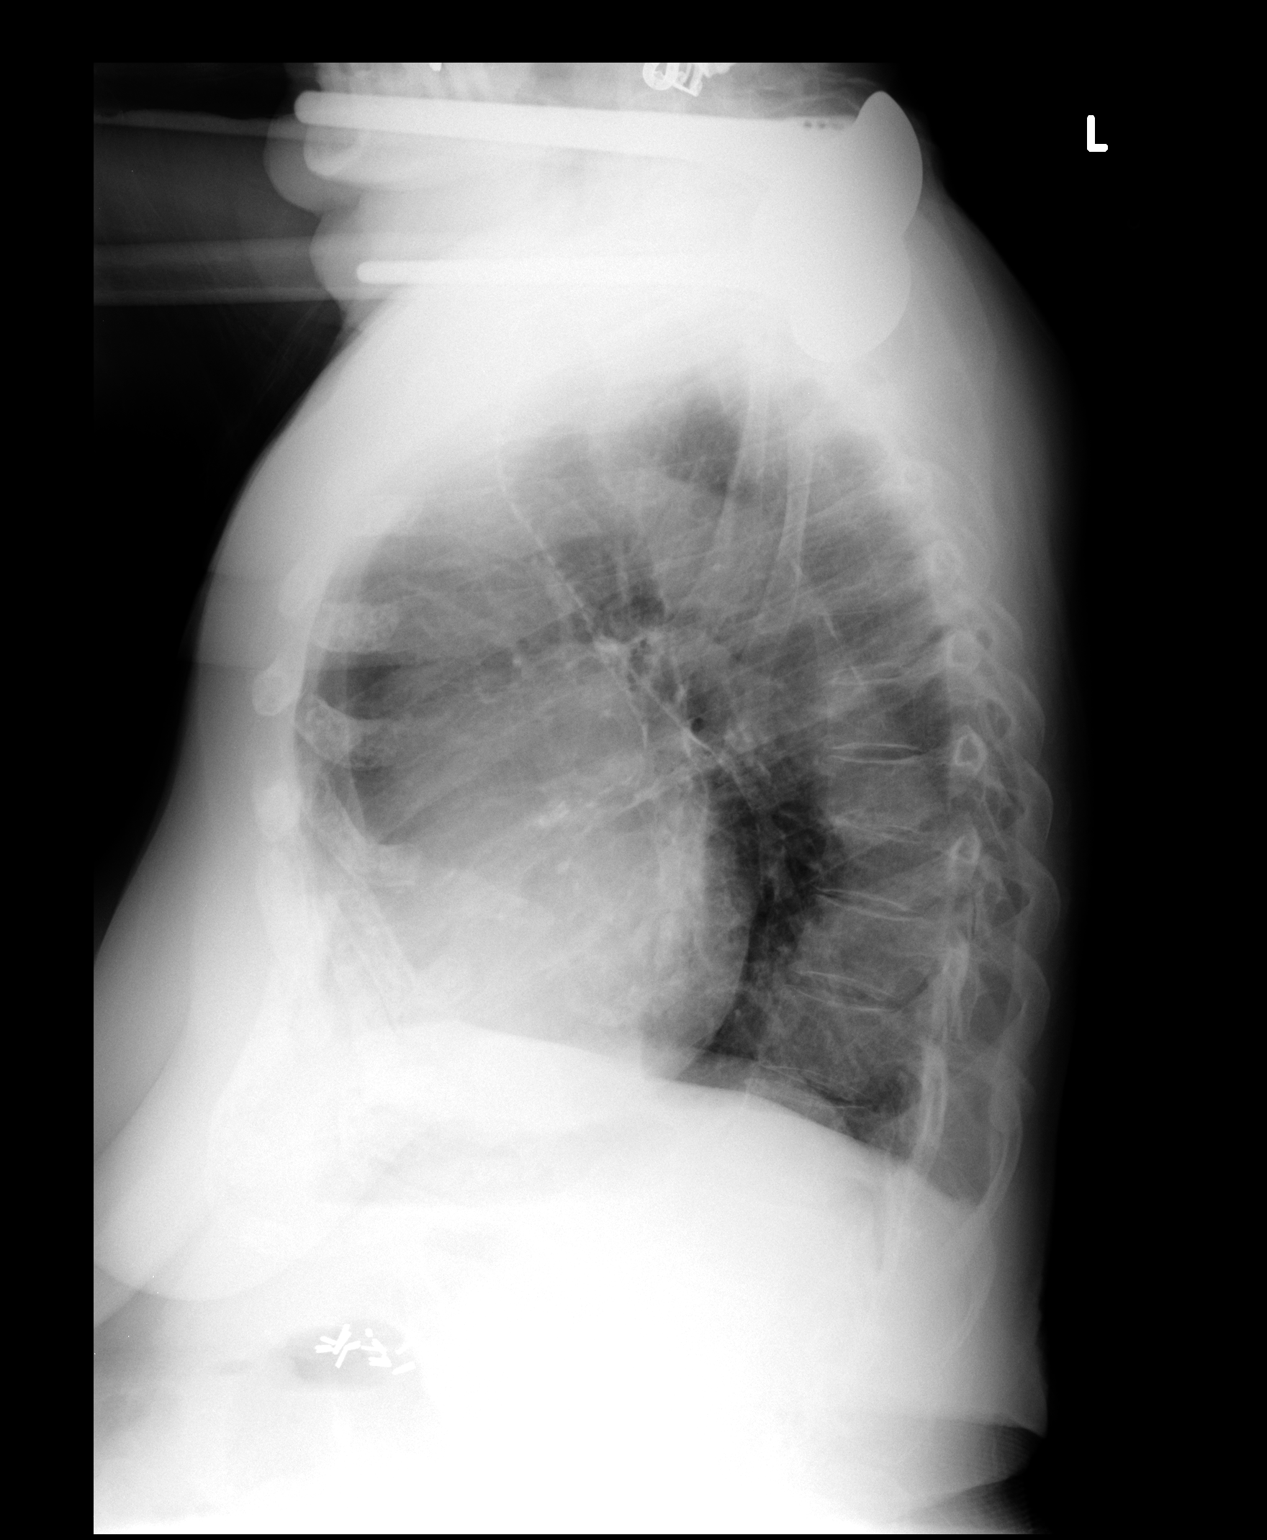

[2 of 2 positions shown; findings below may reference images not displayed]

FINDINGS: Moderate cardiomegaly.  Small right pleural effusion.  No
evidence of edema.  No pneumothorax.  Bilateral shoulder
hemiarthroplasty.
IMPRESSION: Cardiomegaly without edema.

Small right pleural effusion.

## 2013-07-05 ENCOUNTER — Ambulatory Visit: Payer: Medicare PPO | Admitting: Pulmonary Disease
